# Patient Record
Sex: Male | Born: 1975 | Race: Black or African American | Hispanic: No | Marital: Single | Smoking: Current every day smoker
Health system: Southern US, Community
[De-identification: ages and names within clinical notes are randomized; demographics above are authoritative.]

## PROBLEM LIST (undated history)

## (undated) DIAGNOSIS — I2510 Atherosclerotic heart disease of native coronary artery without angina pectoris: Secondary | ICD-10-CM

## (undated) DIAGNOSIS — I10 Essential (primary) hypertension: Secondary | ICD-10-CM

---

## 2013-10-04 ENCOUNTER — Encounter (HOSPITAL_COMMUNITY): Payer: Self-pay | Admitting: Emergency Medicine

## 2013-10-04 ENCOUNTER — Emergency Department (HOSPITAL_COMMUNITY)
Admission: EM | Admit: 2013-10-04 | Discharge: 2013-10-04 | Disposition: A | Discharge status: Home / Self Care | Payer: Self-pay | Attending: Emergency Medicine | Admitting: Emergency Medicine

## 2013-10-04 ENCOUNTER — Emergency Department (HOSPITAL_COMMUNITY): Payer: Self-pay

## 2013-10-04 DIAGNOSIS — J9801 Acute bronchospasm: Secondary | ICD-10-CM | POA: Insufficient documentation

## 2013-10-04 DIAGNOSIS — R079 Chest pain, unspecified: Secondary | ICD-10-CM | POA: Insufficient documentation

## 2013-10-04 DIAGNOSIS — J40 Bronchitis, not specified as acute or chronic: Secondary | ICD-10-CM | POA: Insufficient documentation

## 2013-10-04 DIAGNOSIS — I2510 Atherosclerotic heart disease of native coronary artery without angina pectoris: Secondary | ICD-10-CM | POA: Insufficient documentation

## 2013-10-04 DIAGNOSIS — M549 Dorsalgia, unspecified: Secondary | ICD-10-CM | POA: Insufficient documentation

## 2013-10-04 DIAGNOSIS — Z79899 Other long term (current) drug therapy: Secondary | ICD-10-CM | POA: Insufficient documentation

## 2013-10-04 DIAGNOSIS — F17200 Nicotine dependence, unspecified, uncomplicated: Secondary | ICD-10-CM | POA: Insufficient documentation

## 2013-10-04 HISTORY — DX: Atherosclerotic heart disease of native coronary artery without angina pectoris: I25.10

## 2013-10-04 LAB — CBC WITH DIFFERENTIAL/PLATELET
BASOS ABS: 0 10*3/uL (ref 0.0–0.1)
BASOS PCT: 0 % (ref 0–1)
EOS ABS: 0.2 10*3/uL (ref 0.0–0.7)
Eosinophils Relative: 2 % (ref 0–5)
HCT: 44.3 % (ref 39.0–52.0)
Hemoglobin: 14.4 g/dL (ref 13.0–17.0)
Lymphocytes Relative: 20 % (ref 12–46)
Lymphs Abs: 1.5 10*3/uL (ref 0.7–4.0)
MCH: 27.5 pg (ref 26.0–34.0)
MCHC: 32.5 g/dL (ref 30.0–36.0)
MCV: 84.5 fL (ref 78.0–100.0)
Monocytes Absolute: 0.6 10*3/uL (ref 0.1–1.0)
Monocytes Relative: 8 % (ref 3–12)
Neutro Abs: 5.3 10*3/uL (ref 1.7–7.7)
Neutrophils Relative %: 70 % (ref 43–77)
Platelets: 125 10*3/uL — ABNORMAL LOW (ref 150–400)
RBC: 5.24 MIL/uL (ref 4.22–5.81)
RDW: 13.8 % (ref 11.5–15.5)
WBC: 7.6 10*3/uL (ref 4.0–10.5)

## 2013-10-04 LAB — I-STAT TROPONIN, ED: Troponin i, poc: 0 ng/mL (ref 0.00–0.08)

## 2013-10-04 MED ORDER — ALBUTEROL SULFATE (2.5 MG/3ML) 0.083% IN NEBU
2.5000 mg | INHALATION_SOLUTION | RESPIRATORY_TRACT | Status: DC | PRN
  Administered 2013-10-04: 2.5 mg via RESPIRATORY_TRACT
  Filled 2013-10-04: qty 3

## 2013-10-04 MED ORDER — HYDROCODONE-ACETAMINOPHEN 5-325 MG PO TABS: 2.0000 | ORAL_TABLET | ORAL | Status: DC | PRN

## 2013-10-04 MED ORDER — ALBUTEROL SULFATE HFA 108 (90 BASE) MCG/ACT IN AERS: 1.0000 | INHALATION_SPRAY | Freq: Four times a day (QID) | RESPIRATORY_TRACT | Status: DC | PRN

## 2013-10-04 NOTE — Discharge Planning (Signed)
[facility] Community Liaison  Spoke to patient regarding primary care resources and establishing care with a provider. [employer] orange card application and instructions provided on how to complete the application. Resource guide and my contact information also provided for future questions or concerns. No other Community Liaison needs identified at this time.

## 2013-10-04 NOTE — ED Notes (Signed)
Cp midsternal last nghtt and congestion w/ sore throat

## 2013-10-04 NOTE — ED Provider Notes (Signed)
CSN: [id number]     Arrival date & time 10/04/13  [id number] History   First MD Initiated Contact with Patient 10/04/13 [phone]     Chief Complaint  Patient presents with  . Chest Pain  . Sore Throat      HPI  Patient presents with sore throat chest pain congestion. He says that today yesterday he developed a cough. Started feeling some tightness in his anterior chest and some sharp pain in his back. This all seemed to be reducible with coughing. He was coughing more and more through the evening. His throat became sore. He takes vitamin C. He slept well. He waking this morning and still has discomfort in his chest with coughing. Coughed up some "phlegm". Is not overtly short of breath. No neck jaw or extremity pain. No recent exertional symptoms. No recent prolonged immobilization cast splints fractures surgeries malignancies or other DVT or PE risks. No family history of DVT, PE, or heart disease. Is not hypertensive or diabetic. He smokes intermittently. Works as a Psychologist, occupationalmusician playing the drums  Past Medical History  Diagnosis Date  . Coronary artery disease    History reviewed. No pertinent past surgical history. History reviewed. No pertinent family history. History  Substance Use Topics  . Smoking status: Current Every Day Smoker  . Smokeless tobacco: Not on file  . Alcohol Use: No    Review of Systems  Constitutional: Negative for fever, chills, diaphoresis, appetite change and fatigue.  HENT: Positive for congestion. Negative for mouth sores, sore throat and trouble swallowing.   Eyes: Negative for visual disturbance.  Respiratory: Positive for chest tightness. Negative for cough, shortness of breath and wheezing.   Cardiovascular: Positive for chest pain.  Gastrointestinal: Negative for nausea, vomiting, abdominal pain, diarrhea and abdominal distention.  Endocrine: Negative for polydipsia, polyphagia and polyuria.  Genitourinary: Negative for dysuria, frequency and hematuria.   Musculoskeletal: Positive for back pain. Negative for gait problem.  Skin: Negative for color change, pallor and rash.  Neurological: Negative for dizziness, syncope, light-headedness and headaches.  Hematological: Does not bruise/bleed easily.  Psychiatric/Behavioral: Negative for behavioral problems and confusion.      Allergies  Review of patient's allergies indicates no known allergies.  Home Medications   Prior to Admission medications   Medication Sig Start Date End Date Taking? Authorizing Provider  albuterol (PROVENTIL HFA;VENTOLIN HFA) 108 (90 BASE) MCG/ACT inhaler Inhale 1-2 puffs into the lungs every 6 (six) hours as needed for wheezing. 10/04/13   Rolland PorterMark Baylen Buckner, MD  HYDROcodone-acetaminophen (NORCO/VICODIN) 5-325 MG per tablet Take 2 tablets by mouth every 4 (four) hours as needed. 10/04/13   Rolland PorterMark Deirdra Heumann, MD   BP 115/64  Pulse 68  Temp(Src) 98.6 F (37 C) (Oral)  Resp 16  SpO2 97% Physical Exam  Constitutional: He is oriented to person, place, and time. He appears well-developed and well-nourished. No distress.  HENT:  Head: Normocephalic.  Eyes: Conjunctivae are normal. Pupils are equal, round, and reactive to light. No scleral icterus.  Neck: Normal range of motion. Neck supple. No thyromegaly present.  Cardiovascular: Normal rate and regular rhythm.  Exam reveals no gallop and no friction rub.   No murmur heard. Pulmonary/Chest: Effort normal. No respiratory distress. He has no wheezes. He has rales.  By squeezing noted bilateral posteriorly with cough.  Abdominal: Soft. Bowel sounds are normal. He exhibits no distension. There is no tenderness. There is no rebound.  Musculoskeletal: Normal range of motion.  Neurological: He is alert and oriented to person,  place, and time.  Skin: Skin is warm and dry. No rash noted.  Psychiatric: He has a normal mood and affect. His behavior is normal.    ED Course  Procedures (including critical care time) Labs Review Labs  Reviewed  CBC WITH DIFFERENTIAL - Abnormal; Notable for the following:    Platelets 125 (*)    All other components within normal limits  I-STAT TROPOININ, ED    Imaging Review Dg Chest 2 View  10/04/2013   CLINICAL DATA:  Chest pain, smoker, congestion  EXAM: CHEST  2 VIEW  COMPARISON:  None.  FINDINGS: Borderline cardiomegaly. No acute infiltrate or pleural effusion. No pulmonary edema. Central mild bronchitic changes.  IMPRESSION: No acute infiltrate or pulmonary edema. Central mild bronchitic changes.   Electronically Signed   By: Natasha Mead M.D.   On: 10/04/2013 08:42     EKG Interpretation   Date/Time:  Tuesday October 04 2013 07:27:48 EDT Ventricular Rate:  66 PR Interval:  146 QRS Duration: 90 QT Interval:  370 QTC Calculation: 387 R Axis:   30 Text Interpretation:  Normal sinus rhythm Normal ECG Confirmed by Fayrene Fearing   MD, Durga Saldarriaga ([id number]) on 10/04/2013 8:12:23 AM      MDM   Final diagnoses:  Bronchitis  Chest pain, unspecified chest pain type  Bronchospasm    Minimal wheezing with cough. Not tachycardic, dyspneic, or tach get neck at rest. Not hypoxemic. PERC negative. Normal EKG. troponin. Heart score of one for smoking. Given albuterol neb. Clear lungs. Plan is discharge home with plan treatment for bronchitis. Albuterol inhaler as needed. Vicodin for cough or pain.    Rolland Porter, MD 10/04/13 1000

## 2013-10-04 NOTE — Discharge Instructions (Signed)
Bronchospasm °A bronchospasm is a spasm or tightening of the airways going into the lungs. During a bronchospasm breathing becomes more difficult because the airways get smaller. When this happens there can be coughing, a whistling sound when breathing (wheezing), and difficulty breathing. Bronchospasm is often associated with asthma, but not all patients who experience a bronchospasm have asthma. °CAUSES  °A bronchospasm is caused by inflammation or irritation of the airways. The inflammation or irritation may be triggered by:  °· Allergies (such as to animals, pollen, food, or mold). Allergens that cause bronchospasm may cause wheezing immediately after exposure or many hours later.   °· Infection. Viral infections are believed to be the most common cause of bronchospasm.   °· Exercise.   °· Irritants (such as pollution, cigarette smoke, strong odors, aerosol sprays, and paint fumes).   °· Weather changes. Winds increase molds and pollens in the air. Rain refreshes the air by washing irritants out. Cold air may cause inflammation.   °· Stress and emotional upset.   °SIGNS AND SYMPTOMS  °· Wheezing.   °· Excessive nighttime coughing.   °· Frequent or severe coughing with a simple cold.   °· Chest tightness.   °· Shortness of breath.   °DIAGNOSIS  °Bronchospasm is usually diagnosed through a history and physical exam. Tests, such as chest X-rays, are sometimes done to look for other conditions. °TREATMENT  °· Inhaled medicines can be given to open up your airways and help you breathe. The medicines can be given using either an inhaler or a nebulizer machine. °· Corticosteroid medicines may be given for severe bronchospasm, usually when it is associated with asthma. °HOME CARE INSTRUCTIONS  °· Always have a plan prepared for seeking medical care. Know when to call your health care provider and local emergency services (911 in the [place] Know where you can access local emergency care. °· Only take medicines as  directed by your health care provider. °· If you were prescribed an inhaler or nebulizer machine, ask your health care provider to explain how to use it correctly. Always use a spacer with your inhaler if you were given one. °· It is necessary to remain calm during an attack. Try to relax and breathe more slowly.  °· Control your home environment in the following ways:   °¨ Change your heating and air conditioning filter at least once a month.   °¨ Limit your use of fireplaces and wood stoves. °¨ Do not smoke and do not allow smoking in your home.   °¨ Avoid exposure to perfumes and fragrances.   °¨ Get rid of pests (such as roaches and mice) and their droppings.   °¨ Throw away plants if you see mold on them.   °¨ Keep your house clean and dust free.   °¨ Replace carpet with wood, tile, or vinyl flooring. Carpet can trap dander and dust.   °¨ Use allergy-proof pillows, mattress covers, and box spring covers.   °¨ Wash bed sheets and blankets every week in hot water and dry them in a dryer.   °¨ Use blankets that are made of polyester or cotton.   °¨ Wash hands frequently. °SEEK MEDICAL CARE IF:  °· You have muscle aches.   °· You have chest pain.   °· The sputum changes from clear or white to yellow, green, gray, or bloody.   °· The sputum you cough up gets thicker.   °· There are problems that may be related to the medicine you are given, such as a rash, itching, swelling, or trouble breathing.   °SEEK IMMEDIATE MEDICAL CARE IF:  °· You have worsening wheezing and coughing even   after taking your prescribed medicines.   You have increased difficulty breathing.   You develop severe chest pain. MAKE SURE YOU:   Understand these instructions.  Will watch your condition.  Will get help right away if you are not doing well or get worse. Document Released: 02/06/2003 Document Revised: 02/08/2013 Document Reviewed: 07/26/2012 [facility]Exit[facility] Patient Information 2015 [place], [place]. This information is not  intended to replace advice given to you by your health care provider. Make sure you discuss any questions you have with your health care provider.  Viral Infections A virus is a type of germ. Viruses can cause:  Minor sore throats.  Aches and pains.  Headaches.  Runny nose.  Rashes.  Watery eyes.  Tiredness.  Coughs.  Loss of appetite.  Feeling sick to your stomach (nausea).  Throwing up (vomiting).  Watery poop (diarrhea). HOME CARE   Only take medicines as told by your doctor.  Drink enough water and fluids to keep your pee (urine) clear or pale yellow. Sports drinks are a good choice.  Get plenty of rest and eat healthy. Soups and broths with crackers or rice are fine. GET HELP RIGHT AWAY IF:   You have a very bad headache.  You have shortness of breath.  You have chest pain or neck pain.  You have an unusual rash.  You cannot stop throwing up.  You have watery poop that does not stop.  You cannot keep fluids down.  You or your child has a temperature by mouth above 102 F (38.9 C), not controlled by medicine.  Your baby is older than 3 months with a rectal temperature of 102 F (38.9 C) or higher.  Your baby is 623 months old or younger with a rectal temperature of 100.4 F (38 C) or higher. MAKE SURE YOU:   Understand these instructions.  Will watch this condition.  Will get help right away if you are not doing well or get worse. Document Released: 01/17/2008 Document Revised: 04/28/2011 Document Reviewed: 06/11/2010 [facility] Patient Information 2015 [place], [place]. This information is not intended to replace advice given to you by your health care provider. Make sure you discuss any questions you have with your health care provider.

## 2013-12-16 ENCOUNTER — Encounter (HOSPITAL_COMMUNITY): Payer: Self-pay | Admitting: Emergency Medicine

## 2013-12-16 ENCOUNTER — Emergency Department (HOSPITAL_COMMUNITY): Payer: Self-pay

## 2013-12-16 ENCOUNTER — Emergency Department (HOSPITAL_COMMUNITY)
Admission: EM | Admit: 2013-12-16 | Discharge: 2013-12-16 | Disposition: A | Discharge status: Home / Self Care | Payer: Self-pay | Attending: Emergency Medicine | Admitting: Emergency Medicine

## 2013-12-16 DIAGNOSIS — I2510 Atherosclerotic heart disease of native coronary artery without angina pectoris: Secondary | ICD-10-CM | POA: Insufficient documentation

## 2013-12-16 DIAGNOSIS — Y9389 Activity, other specified: Secondary | ICD-10-CM | POA: Insufficient documentation

## 2013-12-16 DIAGNOSIS — Z791 Long term (current) use of non-steroidal anti-inflammatories (NSAID): Secondary | ICD-10-CM | POA: Insufficient documentation

## 2013-12-16 DIAGNOSIS — Y929 Unspecified place or not applicable: Secondary | ICD-10-CM | POA: Insufficient documentation

## 2013-12-16 DIAGNOSIS — Z87891 Personal history of nicotine dependence: Secondary | ICD-10-CM | POA: Insufficient documentation

## 2013-12-16 DIAGNOSIS — I10 Essential (primary) hypertension: Secondary | ICD-10-CM | POA: Insufficient documentation

## 2013-12-16 DIAGNOSIS — S39012A Strain of muscle, fascia and tendon of lower back, initial encounter: Secondary | ICD-10-CM | POA: Insufficient documentation

## 2013-12-16 DIAGNOSIS — X58XXXA Exposure to other specified factors, initial encounter: Secondary | ICD-10-CM | POA: Insufficient documentation

## 2013-12-16 DIAGNOSIS — M549 Dorsalgia, unspecified: Secondary | ICD-10-CM

## 2013-12-16 DIAGNOSIS — F1490 Cocaine use, unspecified, uncomplicated: Secondary | ICD-10-CM | POA: Insufficient documentation

## 2013-12-16 DIAGNOSIS — I252 Old myocardial infarction: Secondary | ICD-10-CM | POA: Insufficient documentation

## 2013-12-16 HISTORY — DX: Essential (primary) hypertension: I10

## 2013-12-16 LAB — URINALYSIS, ROUTINE W REFLEX MICROSCOPIC
BILIRUBIN URINE: NEGATIVE
Glucose, UA: NEGATIVE mg/dL
Hgb urine dipstick: NEGATIVE
KETONES UR: NEGATIVE mg/dL
LEUKOCYTES UA: NEGATIVE
Nitrite: NEGATIVE
PH: 6 (ref 5.0–8.0)
PROTEIN: NEGATIVE mg/dL
Specific Gravity, Urine: 1.02 (ref 1.005–1.030)
Urobilinogen, UA: 1 mg/dL (ref 0.0–1.0)

## 2013-12-16 MED ORDER — OXYCODONE-ACETAMINOPHEN 5-325 MG PO TABS: 2.0000 | ORAL_TABLET | Freq: Once | ORAL | Status: DC

## 2013-12-16 MED ORDER — DIAZEPAM 5 MG PO TABS
5.0000 mg | ORAL_TABLET | Freq: Once | ORAL | Status: AC
  Administered 2013-12-16: 5 mg via ORAL
  Filled 2013-12-16: qty 1

## 2013-12-16 MED ORDER — DIAZEPAM 5 MG PO TABS: 5.0000 mg | ORAL_TABLET | Freq: Three times a day (TID) | ORAL | Status: DC | PRN

## 2013-12-16 NOTE — Discharge Instructions (Signed)
Back Pain, Adult °Low back pain is very common. About 1 in 5 people have back pain. The cause of low back pain is rarely dangerous. The pain often gets better over time. About half of people with a sudden onset of back pain feel better in just 2 weeks. About 8 in 10 people feel better by 6 weeks.  °CAUSES °Some common causes of back pain include: °· Strain of the muscles or ligaments supporting the spine. °· Wear and tear (degeneration) of the spinal discs. °· Arthritis. °· Direct injury to the back. °DIAGNOSIS °Most of the time, the direct cause of low back pain is not known. However, back pain can be treated effectively even when the exact cause of the pain is unknown. Answering your caregiver's questions about your overall health and symptoms is one of the most accurate ways to make sure the cause of your pain is not dangerous. If your caregiver needs more information, he or she may order lab work or imaging tests (X-rays or MRIs). However, even if imaging tests show changes in your back, this usually does not require surgery. °HOME CARE INSTRUCTIONS °For many people, back pain returns. Since low back pain is rarely dangerous, it is often a condition that people can learn to manage on their own.  °· Remain active. It is stressful on the back to sit or stand in one place. Do not sit, drive, or stand in one place for more than 30 minutes at a time. Take short walks on level surfaces as soon as pain allows. Try to increase the length of time you walk each day. °· Do not stay in bed. Resting more than 1 or 2 days can delay your recovery. °· Do not avoid exercise or work. Your body is made to move. It is not dangerous to be active, even though your back may hurt. Your back will likely heal faster if you return to being active before your pain is gone. °· Pay attention to your body when you  bend and lift. Many people have less discomfort when lifting if they bend their knees, keep the load close to their bodies, and  avoid twisting. Often, the most comfortable positions are those that put less stress on your recovering back. °· Find a comfortable position to sleep. Use a firm mattress and lie on your side with your knees slightly bent. If you lie on your back, put a pillow under your knees. °· Only take over-the-counter or prescription medicines as directed by your caregiver. Over-the-counter medicines to reduce pain and inflammation are often the most helpful. Your caregiver may prescribe muscle relaxant drugs. These medicines help dull your pain so you can more quickly return to your normal activities and healthy exercise. °· Put ice on the injured area. °¨ Put ice in a plastic bag. °¨ Place a towel between your skin and the bag. °¨ Leave the ice on for 15-20 minutes, 03-04 times a day for the first 2 to 3 days. After that, ice and heat may be alternated to reduce pain and spasms. °· Ask your caregiver about trying back exercises and gentle massage. This may be of some benefit. °· Avoid feeling anxious or stressed. Stress increases muscle tension and can worsen back pain. It is important to recognize when you are anxious or stressed and learn ways to manage it. Exercise is a great option. °SEEK MEDICAL CARE IF: °· You have pain that is not relieved with rest or medicine. °· You have pain that does not improve in 1 week. °· You have new symptoms. °· You are generally not feeling well. °SEEK   IMMEDIATE MEDICAL CARE IF:  °· You have pain that radiates from your back into your legs. °· You develop new bowel or bladder control problems. °· You have unusual weakness or numbness in your arms or legs. °· You develop nausea or vomiting. °· You develop abdominal pain. °· You feel faint. °Document Released: 02/03/2005 Document Revised: 08/05/2011 Document Reviewed: 06/07/2013 °ExitCare® Patient Information ©2015 [employer]. This information is not intended to replace advice given to you by your health care provider. Make sure you  discuss any questions you have with your health care provider. ° ° ° ° °Emergency Department Resource Guide °1) Find a Doctor and Pay Out of Pocket °Although you won't have to find out who is covered by your insurance plan, it is a good idea to ask around and get recommendations. You will then need to call the office and see if the doctor you have chosen will accept you as a new patient and what types of options they offer for patients who are self-pay. Some doctors offer discounts or will set up payment plans for their patients who do not have insurance, but you will need to ask so you aren't surprised when you get to your appointment. ° °2) Contact Your Local Health Department °Not all health departments have doctors that can see patients for sick visits, but many do, so it is worth a call to see if yours does. If you don't know where your local health department is, you can check in your phone book. The CDC also has a tool to help you locate your state's health department, and many state websites also have listings of all of their local health departments. ° °3) Find a Walk-in Clinic °If your illness is not likely to be very severe or complicated, you may want to try a walk in clinic. These are popping up all over the country in pharmacies, drugstores, and shopping centers. They're usually staffed by nurse practitioners or physician assistants that have been trained to treat common illnesses and complaints. They're usually fairly quick and inexpensive. However, if you have serious medical issues or chronic medical problems, these are probably not your best option. ° °No Primary Care Doctor: °- Call [facility] at  832-8000 - they can help you locate a primary care doctor that  accepts your insurance, provides certain services, etc. °- Physician Referral Service- 1-800-533-3463 ° °Chronic Pain Problems: °Organization         Address  Phone   Notes  °[facility]  (336) 297-2271 Patients need  to be referred by their primary care doctor.  ° °Medication Assistance: °Organization         Address  Phone   Notes  °[place] [facility] [address] °[place], [place] [address] (336) 641-8030 --Must be a resident of [place] °-- Must have NO insurance coverage whatsoever (no Medicaid/ Medicare, etc.) °-- The pt. MUST have a primary care doctor that directs their care regularly and follows them in the community °  °[facility]  (866) 331-1348   °[employer]  (888) 892-1162   ° °Agencies that provide inexpensive medical care: °Organization         Address  Phone   Notes  °[facility]  (336) 832-8035   °[facility]    (336) 832-7272   °[facility] [address] °[place],  [address] (336) 832-4777   °[facility]  [address], °[place] (336) 271-4999   °[facility]    (336) 373-0678   °[facility]    (336) 272-1050   °[facility] ° [address], [place] Phone:  (336) 832-4444, Fax:  (336) 832-4440 Hours of Operation:  9 am - 6 pm, M-F.  Also accepts Medicaid/Medicare and self-pay.  °[facility] ° [address], [place] Phone: (336) 832-3150, Fax: (336) 832-3151. Hours of Operation:  8:30 am - 5:30 pm, M-F.  Also accepts Medicaid and self-pay.  °[facility] [place] [address], [place] Phone: (336) 878-6027   °[facility] [address], [place], [place] (336)723-1848, Ext. 123 Mondays & Thursdays: 7-9 AM.  First 15 patients are seen on a first come, first serve basis. °  ° °Medicaid-accepting [place] Providers: ° °Organization         Address  Phone   Notes  °[facility] [address], [place] (336) 641-2100 Also accepts self-pay patients.  °[facility] [address], [place] ° (336) 856-9996   °[facility] [address], [place] (336) 288-8857   °[facility] [address] [place] [address], [place] (336) 299-7000   °Veita Bland [address], [place]  ° (336) 373-1557 Only accepts [place] Access Medicaid patients after they have their name applied to their card.  ° °Self-Pay (no insurance) in [place]: ° °Organization         Address  Phone   Notes  °Sickle Cell Patients, [facility] [address], [place] (336) 832-1970   °[facility] [address], [place] (336) 832-4400   °[facility] ° [address], [place] (336) 992-4800   °[facility]/Dr. Osei-Bonsu ° [address] [place] [address], [place] or [address], [place] (336) 841-8500 Phone number for both [place] and [place] locations is the same.  °[facility] [address], [place] (336) 299-0000   °[facility] [address] [place] [address], [place] or [address] (336) 852-7530 °(336) 878-2260   °[facility] [address],  (336) 350-1642, phone; (336) 294-5005, fax Sees patients 1st and 3rd Saturday of every month.  Must not qualify for public or private insurance (i.e. Medicaid, Medicare, [place] Health Choice, Veterans' Benefits) • Household income should be no more than 200% of the poverty level •The clinic cannot treat you if you are pregnant or think you are pregnant • Sexually transmitted diseases are not treated at the clinic.  ° ° °Dental Care: °Organization         Address  Phone  Notes  °[facility] [facility] [address],  (336) 641-6152 Accepts children up to age 21 who are enrolled in Medicaid or [place] Health Choice; pregnant women with a Medicaid card; and children who have applied for Medicaid or [place] Health Choice, but were declined, whose  parents can pay a reduced fee at time of service.  °[facility] [place]  [address], [place] (336) 641-7733 Accepts children up to age 21 who are enrolled in Medicaid or [place] Health Choice; pregnant women with a Medicaid card; and children who have applied for Medicaid   or [place] Health Choice, but were declined, whose parents can pay a reduced fee at time of service.  °[facility] ° [address], [place] (336) 641-4533 Patients are seen by appointment only. Walk-ins are not accepted. [facility] will see patients 18 years of age and older. °Monday - Tuesday (8am-5pm) °Most Wednesdays (8:30-5pm) °$30 per visit, cash only  °[facility] ° [address], [place] (336) 641-4533 Patients are seen by appointment only. Walk-ins are not accepted. [facility] will see patients 18 years of age and older. °One Wednesday Evening (Monthly: Volunteer Based).  $30 per visit, cash only  °[facility]  (919) 537-3737 for adults; Children under age 4, call Graduate Pediatric Dentistry at (919) [phone]. Children aged 4-14, please call (919) [phone] to request a pediatric application. ° Dental services are provided in all areas of dental care including fillings, crowns and bridges, complete and partial dentures, implants, gum treatment, root canals, and extractions. Preventive care is also provided. Treatment is provided to both adults and children. °Patients are selected via a lottery and there is often a waiting list. °  °[facility] [address], °[place] ° (336) 763-8833 [email or web address] °  °[facility] [address], [place], [place] (336)723-1848, Ext. 123 Second and Fourth Thursday of each month, opens at 6:30 AM; Clinic ends at 9 AM.  Patients are seen on a first-come first-served basis, and a limited number are seen during each clinic.  ° °[facility] °  [address], [place], [place] (336) 723-7904   Eligibility Requirements °You must have lived in [place], [place], or [place] counties for at least the last three months. °  You cannot be eligible for state or federal sponsored healthcare insurance, including Veterans Administration, Medicaid, or Medicare. °  You generally cannot be eligible for healthcare insurance through your employer.  °  How to apply: °Eligibility screenings are held every Tuesday and Wednesday afternoon from 1:00 pm until 4:00 pm. You do not need an appointment for the interview!  °[facility] [address], [place], [place] 336-631-2330   °[facility]  336-342-8273   °[facility]  336-703-3100   °[facility]  336-570-6415   ° °Behavioral Health Resources in the Community: °Intensive Outpatient Programs °Organization         Address  Phone  Notes  °[facility] [address], [place], [place] 336-878-6098   °[facility] [address], [place], [place] 336-832-9800   °[facility] [address], [place], [place] ° 336-882-2125   °[facility] [address],  °[place],  1-800-853-5163 or 336-641-4981   °Substance Abuse Resources °Organization         Address  Phone  Notes  °[facility]  336-882-2125   °[facility]  336-784-9470   °[facility]  336-285-9073   °[facility]  336-845-3988   °Residential & Outpatient Substance Abuse Program  1-800-659-3381   °Psychological Services °Organization         Address  Phone  Notes  °[facility]  336- 832-9600   °[facility]  336- 378-7881   °[facility] [address], [place] 1-800-853-5163 or 336-641-4981   ° °Mobile Crisis Teams °Organization         Address  Phone    Notes  °[facility], Mobile Crisis Care Unit  1-877-626-1772     °Assertive °[facility] ° [address] [place], [place] 336-834-9664   °Sharon DeEsch [address] °[place] 336-554-5454   ° °Self-Help/Support Groups °Organization         Address  Phone             Notes  °[facility] [place] - variety of support groups  336- 373-1402 Call for more information  °Narcotics Anonymous (NA), [facility] [address], °[place]  2 meetings at this location  ° °Residential Treatment Programs °Organization         Address  Phone  Notes  °[facility] [address],    °[place]  1-866-801-8205   °[facility] ° [address], [place], [place] 704-293-8524   °[facility] [address], [place] 336-845-3988 Admissions: 8am-3pm M-F  °[facility] [address]    °[place], [place] 336-841-1104   °[facility] [address], [place], [place] 336-379-7146   °[facility] [address]  °[place], [place] 336-285-9073   °[facility] - Intensive Outpatient [address], [place], [place] 336-852-3033   °[facility] [address]  °[place], [place] 1-877-615-2722 or 336-784-9470   °[facility] [address] [place], [place] 336-227-7417 Accepts Medicaid  °[facility] [address]  °[place] 1-800-659-3381 Substance Abuse/Addiction Treatment  ° °[place] Behavioral Health Resources °Organization         Address  Phone  Notes  °[facility]  (888) 581-9988   °Julie Brannon, PhD [address] [place], [place]   (336) 349-5553 or (336) 951-0000   °[facility]   [address] °[place], [place] (336) 349-4454   °[facility] [address], [place], [place] (336) 342-8316 Insurance/Medicaid/sponsorship through [facility]  °[facility] [address]                                     [place], [place] (336) 342-8316 Therapy/tele-psych/case  °[facility] [address]  ° [place], [place] (336) 349-2233    °Dr. Arfeen  (336) 349-4544   °[facility] [place]  [employer] [place] [facility] 1) [address], [place] °2) [address], [place] °3)  [address], [place] (336) 349-3220 °(336) 342-7768 ° °(336) 342-8140   °[place] [facility] (336) 342-1394 or (336) 342-3537 (After Hours)    ° ° ° ° °

## 2013-12-16 NOTE — ED Provider Notes (Signed)
CSN: [id number]     Arrival date & time 12/16/13  0722 History   First MD Initiated Contact with Patient 12/16/13 [phone]     Chief Complaint  Patient presents with  . Back Pain     (Consider location/radiation/quality/duration/timing/severity/associated sxs/prior Treatment) Patient is a 38 y.o. male presenting with back pain.  Back Pain Location:  Lumbar spine Quality:  Aching Radiates to:  Does not radiate Pain severity:  Severe Pain is:  Unable to specify Onset quality:  Gradual Duration:  4 days Timing:  Constant Progression:  Unchanged Chronicity:  New Context: emotional stress   Relieved by:  Nothing Worsened by:  Twisting Ineffective treatments:  None tried Associated symptoms: no bladder incontinence, no bowel incontinence, no chest pain, no fever, no perianal numbness and no tingling     Past Medical History  Diagnosis Date  . Coronary artery disease   . Hypertension    History reviewed. No pertinent past surgical history. History reviewed. No pertinent family history. History  Substance Use Topics  . Smoking status: Former Games developermoker  . Smokeless tobacco: Never Used  . Alcohol Use: No    Review of Systems  Constitutional: Negative for fever.  Cardiovascular: Negative for chest pain.  Gastrointestinal: Negative for bowel incontinence.  Genitourinary: Negative for bladder incontinence.  Musculoskeletal: Positive for back pain.  Neurological: Negative for tingling.  All other systems reviewed and are negative.     Allergies  Review of patient's allergies indicates no known allergies.  Home Medications   Prior to Admission medications   Medication Sig Start Date End Date Taking? Authorizing Murriel Holwerda  ibuprofen (ADVIL,MOTRIN) 200 MG tablet Take 1,400-1,600 mg by mouth 2 (two) times daily as needed for moderate pain.   Yes Historical Jaydrian Corpening, MD  naproxen sodium (ANAPROX) 220 MG tablet Take 880-1,320 mg by mouth 2 (two) times daily as needed (pain).   Yes  Historical Sivan Cuello, MD  diazepam (VALIUM) 5 MG tablet Take 1 tablet (5 mg total) by mouth every 8 (eight) hours as needed for muscle spasms. 12/16/13   Mirian MoMatthew Gentry, MD   BP 124/80  Pulse 58  Temp(Src) 98.3 F (36.8 C) (Oral)  SpO2 100% Physical Exam  Vitals reviewed. Constitutional: He is oriented to person, place, and time. He appears well-developed and well-nourished.  HENT:  Head: Normocephalic and atraumatic.  Eyes: Conjunctivae and EOM are normal.  Neck: Normal range of motion. Neck supple.  Cardiovascular: Normal rate, regular rhythm and normal heart sounds.   Pulmonary/Chest: Effort normal and breath sounds normal. No respiratory distress.  Abdominal: He exhibits no distension. There is no tenderness. There is no rebound and no guarding.  Musculoskeletal: Normal range of motion.       Cervical back: Normal.       Thoracic back: Normal.       Lumbar back: He exhibits tenderness and bony tenderness.  Neurological: He is alert and oriented to person, place, and time.  Skin: Skin is warm and dry.    ED Course  Procedures (including critical care time) Labs Review Labs Reviewed  URINALYSIS, ROUTINE W REFLEX MICROSCOPIC    Imaging Review Dg Lumbar Spine Complete  12/16/2013   CLINICAL DATA:  38 year old male with acute low back pain radiating down both legs. No known injury. Initial encounter.  EXAM: LUMBAR SPINE - COMPLETE 4+ VIEW  COMPARISON:  None.  FINDINGS: Normal lumbar segmentation. Bone mineralization is within normal limits. Vertebral height and alignment within normal limits. Relatively preserved disc spaces. Mild endplate spurring  most pronounced at L4-L5. No pars fracture. No facet sclerosis. sacral ala and SI joints within normal limits. Grossly intact visible lower thoracic levels. There is lower thoracic endplate spurring.  IMPRESSION: Largely unremarkable for age radiographic appearance of the lumbar spine.   Electronically Signed   By: Augusto GambleLee  Hall M.D.   On:  12/16/2013 08:27     EKG Interpretation None      MDM   Final diagnoses:  Back pain  Low back strain, initial encounter    38 y.o. male with pertinent PMH of HTN presents with lumbar back pain and concern for cocaine use.  Patient endorses increased life stressors, use of cocaine, and spontaneous atraumatic back pain beginning 3-4 days ago. He states the pain is worse with movement, denies systemic symptoms such as nausea, vomiting, fever, chest pain, shortness of breath. On medical history the patient states that he has had a prior heart attack, however states that he was evaluated for 8 hours then discharged. He did not have heart catheterization or any other concerning features for true coronary artery disease.  He does have a family ho MI at early age.  On arrival today, vital signs physical exam as above consistent with muscular skeletal back pain. Patient is exact reproduction of symptoms with twisting the back and lifting of bilateral lower extremity. No historical or physical exam elements concerning for cauda equina or central cord pathology. Similarly no fevers to suggest epidural abscess. Suspect muscular skeletal strain secondary to increased stressors.  UA and xr unremarkable.  DC home in stable condition.    1. Back pain   2. Low back strain, initial encounter         Mirian MoMatthew Gentry, MD 12/16/13 [phone]

## 2013-12-16 NOTE — ED Notes (Signed)
Pt from home with c/o bilateral lower back pain x 3 days.  Pt reports increased pain that is sharp when lifting legs.  Pt ambulatory to room with ease, NAD, A&O.

## 2014-01-24 ENCOUNTER — Encounter (HOSPITAL_COMMUNITY): Payer: Self-pay | Admitting: *Deleted

## 2014-01-24 ENCOUNTER — Emergency Department (HOSPITAL_COMMUNITY)
Admission: EM | Admit: 2014-01-24 | Discharge: 2014-01-24 | Disposition: A | Discharge status: Other Acute Inpatient Hospital | Payer: Self-pay | Attending: Emergency Medicine | Admitting: Emergency Medicine

## 2014-01-24 ENCOUNTER — Encounter (HOSPITAL_COMMUNITY): Payer: Self-pay | Admitting: Emergency Medicine

## 2014-01-24 ENCOUNTER — Inpatient Hospital Stay (HOSPITAL_COMMUNITY)
Admission: AD | Admit: 2014-01-24 | Discharge: 2014-01-27 | DRG: 881 | Disposition: A | Discharge status: Home / Self Care | Payer: Federal, State, Local not specified - Other | Source: Intra-hospital | Attending: Psychiatry | Admitting: Psychiatry

## 2014-01-24 DIAGNOSIS — F329 Major depressive disorder, single episode, unspecified: Principal | ICD-10-CM | POA: Diagnosis present

## 2014-01-24 DIAGNOSIS — F32A Depression, unspecified: Secondary | ICD-10-CM

## 2014-01-24 DIAGNOSIS — Z87891 Personal history of nicotine dependence: Secondary | ICD-10-CM

## 2014-01-24 DIAGNOSIS — I10 Essential (primary) hypertension: Secondary | ICD-10-CM | POA: Insufficient documentation

## 2014-01-24 DIAGNOSIS — R45851 Suicidal ideations: Secondary | ICD-10-CM | POA: Diagnosis present

## 2014-01-24 DIAGNOSIS — F321 Major depressive disorder, single episode, moderate: Secondary | ICD-10-CM | POA: Insufficient documentation

## 2014-01-24 DIAGNOSIS — I2510 Atherosclerotic heart disease of native coronary artery without angina pectoris: Secondary | ICD-10-CM | POA: Insufficient documentation

## 2014-01-24 LAB — BASIC METABOLIC PANEL
ANION GAP: 13 (ref 5–15)
BUN: 12 mg/dL (ref 6–23)
CALCIUM: 9.7 mg/dL (ref 8.4–10.5)
CHLORIDE: 100 meq/L (ref 96–112)
CO2: 25 mEq/L (ref 19–32)
CREATININE: 1.41 mg/dL — AB (ref 0.50–1.35)
GFR calc Af Amer: 73 mL/min — ABNORMAL LOW (ref >90)
GFR calc non Af Amer: 63 mL/min — ABNORMAL LOW (ref >90)
Glucose, Bld: 104 mg/dL — ABNORMAL HIGH (ref 70–99)
Potassium: 3.9 mEq/L (ref 3.7–5.3)
Sodium: 138 mEq/L (ref 137–147)

## 2014-01-24 LAB — CBC WITH DIFFERENTIAL/PLATELET
BASOS PCT: 0 % (ref 0–1)
Basophils Absolute: 0 10*3/uL (ref 0.0–0.1)
EOS ABS: 0.1 10*3/uL (ref 0.0–0.7)
Eosinophils Relative: 1 % (ref 0–5)
HEMATOCRIT: 47.2 % (ref 39.0–52.0)
HEMOGLOBIN: 15.4 g/dL (ref 13.0–17.0)
Lymphocytes Relative: 25 % (ref 12–46)
Lymphs Abs: 2.1 10*3/uL (ref 0.7–4.0)
MCH: 27.8 pg (ref 26.0–34.0)
MCHC: 32.6 g/dL (ref 30.0–36.0)
MCV: 85.2 fL (ref 78.0–100.0)
MONO ABS: 0.6 10*3/uL (ref 0.1–1.0)
Monocytes Relative: 7 % (ref 3–12)
Neutro Abs: 5.7 10*3/uL (ref 1.7–7.7)
Neutrophils Relative %: 67 % (ref 43–77)
Platelets: 187 10*3/uL (ref 150–400)
RBC: 5.54 MIL/uL (ref 4.22–5.81)
RDW: 14.3 % (ref 11.5–15.5)
WBC: 8.5 10*3/uL (ref 4.0–10.5)

## 2014-01-24 LAB — ETHANOL: Alcohol, Ethyl (B): <11 mg/dL (ref 0–11)

## 2014-01-24 MED ORDER — ONDANSETRON HCL 4 MG PO TABS: 4.0000 mg | ORAL_TABLET | Freq: Three times a day (TID) | ORAL | Status: DC | PRN

## 2014-01-24 MED ORDER — NICOTINE 21 MG/24HR TD PT24
21.0000 mg | MEDICATED_PATCH | Freq: Every day | TRANSDERMAL | Status: DC
  Administered 2014-01-25 – 2014-01-27 (×3): 21 mg via TRANSDERMAL
  Filled 2014-01-24 (×6): qty 1

## 2014-01-24 MED ORDER — ALUM & MAG HYDROXIDE-SIMETH 200-200-20 MG/5ML PO SUSP: 30.0000 mL | ORAL | Status: DC | PRN

## 2014-01-24 MED ORDER — MAGNESIUM HYDROXIDE 400 MG/5ML PO SUSP: 30.0000 mL | Freq: Every day | ORAL | Status: DC | PRN

## 2014-01-24 MED ORDER — LORAZEPAM 1 MG PO TABS: 1.0000 mg | ORAL_TABLET | Freq: Three times a day (TID) | ORAL | Status: DC | PRN

## 2014-01-24 MED ORDER — ACETAMINOPHEN 325 MG PO TABS: 650.0000 mg | ORAL_TABLET | Freq: Four times a day (QID) | ORAL | Status: DC | PRN

## 2014-01-24 MED ORDER — ACETAMINOPHEN 325 MG PO TABS: 650.0000 mg | ORAL_TABLET | ORAL | Status: DC | PRN

## 2014-01-24 MED ORDER — NICOTINE 21 MG/24HR TD PT24
21.0000 mg | MEDICATED_PATCH | Freq: Every day | TRANSDERMAL | Status: DC
  Administered 2014-01-24: 21 mg via TRANSDERMAL
  Filled 2014-01-24: qty 1

## 2014-01-24 MED ORDER — POLYETHYLENE GLYCOL 3350 17 G PO PACK
17.0000 g | PACK | Freq: Every day | ORAL | Status: DC
  Administered 2014-01-24: 17 g via ORAL
  Filled 2014-01-24 (×5): qty 1
  Filled 2014-01-24: qty 14
  Filled 2014-01-24 (×3): qty 1

## 2014-01-24 MED ORDER — TRAZODONE HCL 50 MG PO TABS
50.0000 mg | ORAL_TABLET | Freq: Every day | ORAL | Status: DC
  Administered 2014-01-25 – 2014-01-26 (×2): 50 mg via ORAL
  Filled 2014-01-24 (×3): qty 1
  Filled 2014-01-24: qty 14
  Filled 2014-01-24 (×2): qty 1

## 2014-01-24 NOTE — ED Provider Notes (Signed)
CSN: [id number]     Arrival date & time 01/24/14  [id number] History   First MD Initiated Contact with Patient 01/24/14 [phone]     Chief Complaint  Patient presents with  . Depression     (Consider location/radiation/quality/duration/timing/severity/associated sxs/prior Treatment) HPI  Pt presenting with c/o depression and threats of suicide.  He sent a text message to a friend/family member saying that he was so upset that he could only "end his life"  This person contacted police after patient did not return phone calls.  Pt came to the ED voluntarily with police to seek help.  He states he has not been able to sleep, he states that his plan would be to jump in front of traffic.  He states he is feeling improved today but would like to have some counseling.  There are no other associated systemic symptoms, there are no other alleviating or modifying factors.  No recent illnesses, no fever.    Past Medical History  Diagnosis Date  . Coronary artery disease   . Hypertension    History reviewed. No pertinent past surgical history. History reviewed. No pertinent family history. History  Substance Use Topics  . Smoking status: Former Games developermoker  . Smokeless tobacco: Never Used  . Alcohol Use: No    Review of Systems  ROS reviewed and all otherwise negative except for mentioned in HPI    Allergies  Review of patient's allergies indicates no known allergies.  Home Medications   Prior to Admission medications   Medication Sig Start Date End Date Taking? Authorizing Provider  diazepam (VALIUM) 5 MG tablet Take 1 tablet (5 mg total) by mouth every 8 (eight) hours as needed for muscle spasms. 12/16/13  Yes Mirian MoMatthew Gentry, MD  ibuprofen (ADVIL,MOTRIN) 200 MG tablet Take 1,400-1,600 mg by mouth 2 (two) times daily as needed for moderate pain.   Yes Historical Provider, MD  naproxen sodium (ANAPROX) 220 MG tablet Take 880-1,320 mg by mouth 2 (two) times daily as needed (pain).   Yes Historical  Provider, MD   BP 97/45 mmHg  Pulse 58  Temp(Src) 98.1 F (36.7 C) (Oral)  Resp 16  SpO2 100%  Vitals reviewed Physical Exam  Physical Examination: General appearance - alert, well appearing, and in no distress Mental status - alert, oriented to person, place, and time Eyes - no scleral icterus, no conjunctival injection Mouth - mucous membranes moist, pharynx normal without lesions Chest - clear to auscultation, no wheezes, rales or rhonchi, symmetric air entry Heart - normal rate, regular rhythm, normal S1, S2, no murmurs, rubs, clicks or gallops Extremities - peripheral pulses normal, no pedal edema, no clubbing or cyanosis Skin - normal coloration and turgor, no rashes Psych- normal mood and affect  ED Course  Procedures (including critical care time) Labs Review Labs Reviewed  BASIC METABOLIC PANEL - Abnormal; Notable for the following:    Glucose, Bld 104 (*)    Creatinine, Ser 1.41 (*)    GFR calc non Af Amer 63 (*)    GFR calc Af Amer 73 (*)    All other components within normal limits  CBC WITH DIFFERENTIAL  ETHANOL  URINE RAPID DRUG SCREEN (HOSP PERFORMED)    Imaging Review No results found.   EKG Interpretation None      MDM   Final diagnoses:  Depression with suicidal ideation    Pt presenting with c/o depression and made suicidal statements yesterday via text message. Pt medically cleared and has been seen by  TTS, pt has been accepted [facility] for [facility].      Ethelda ChickMartha K Linker, MD 01/24/14 [phone]

## 2014-01-24 NOTE — ED Notes (Signed)
MD at bedside. 

## 2014-01-24 NOTE — BH Assessment (Signed)
[facility] by Dr. Jannifer FranklinAkintayo and Assunta FoundShuvon Rankin, NP. Nursing report [phone]. Support paperwork completed.

## 2014-01-24 NOTE — ED Notes (Signed)
Pt came to ED for psychiatric counseling due to depression and intermittent feelings of self harm. Pt states he wants this pain to end, and that his plan for suicide would be to jump in front of a moving car. Pt denies any desire to hurt himself today. Patient was recently separated from his family, has been unable to sleep, denies hallucinations.

## 2014-01-24 NOTE — Tx Team (Signed)
Initial Interdisciplinary Treatment Plan   PATIENT STRESSORS: Marital or family conflict   PATIENT STRENGTHS: Capable of independent living General fund of knowledge Physical Health   PROBLEM LIST: Problem List/Patient Goals Date to be addressed Date deferred Reason deferred Estimated date of resolution  none                                                       DISCHARGE CRITERIA:  Improved stabilization in mood, thinking, and/or behavior Need for constant or close observation no longer present  PRELIMINARY DISCHARGE PLAN: Return to previous living arrangement Return to previous work or school arrangements  PATIENT/FAMIILY INVOLVEMENT: This treatment plan has been presented to and reviewed with the patient, Jose Hays.  The patient and family have been given the opportunity to ask questions and make suggestions.  [name], Jose Hays 01/24/2014, 3:40 PM

## 2014-01-24 NOTE — Progress Notes (Signed)
Recreation Therapy Notes  Animal-Assisted Activity/Therapy (AAA/T) Program Checklist/Progress Notes Patient Eligibility Criteria Checklist & Daily Group note for Rec Tx Intervention  Date: 12.08.2015 Time: 2:45pm  Location: [address] Dayroom    AAA/T Program Assumption of Risk Form signed by Patient/ or Parent Legal Guardian yes  Patient is free of allergies or sever asthma yes  Patient reports no fear of animals yes  Patient reports no history of cruelty to animals yes  Patient understands his/her participation is voluntary yes  Patient washes hands before animal contact yes  Patient washes hands after animal contact yes  Behavioral Response: Did not attend.   Kori Colin L Wyley Hack, LRT/CTRS  Klayton Monie L 01/24/2014 4:06 PM 

## 2014-01-24 NOTE — Progress Notes (Signed)
Patient came after send a text to the girlfriend that he wanted to end it all; upon admission patient states that he just wanted to talk with someone and it got him here. Patient signed a 72 hour discharge on 01/24/14 at 1420 hrs. Patient is cooperative but really wants to leave; patient is currently denying SI/HI/A/V hallucinations; patient has no other request at this time

## 2014-01-24 NOTE — BH Assessment (Addendum)
Assessment Note  Jose BaldingJohn Hays is an 38 y.o. male who came to the emergency room after texting his girlfriend a suicide note. The note stated that he wanted to die and said goodbye to his girlfriend and her kids. He stated that this was impulsive and he was just upset in the moment but he reports that his girlfriend called the police and he came to the ED. He states that he has been having a lot of issues surrounding his relationship with his girlfriend and just hit a breaking point. Pt has no prior history of psychiatric conditions and has never seen a counselor or psychiatrist or had any inpatient admissions. Pt reports that girlfriend often cheats on him and he moved out of her house in October because of this. He states that he wants to be back with her and her 3 girls but she [place] man [place] in the house and plans to marry him within 90 days. Pt states that she is pregnant with his child and she took the other man to the Dr.'s appointment the other day and refused to take him and that's what sent him over the edge. He reports racing thoughts and poor sleep. He says that he tosses and turns and gets up a few times a night. His appetite fluctuate- some days he eats a lot others he doesn't at all.  At this moment he does not endorse SI, HI or A/V hallucinations. He says that he received an eviction notice yesterday and has a court date 01/30/14 and he is worried that he wont be able to pay and will get his apartment taken away. He states that his lights are already cut off due to not being able to pay his bills. Per Dr. Lenore CordiaAkintyo [facility].   Axis I: Major Depression, single episode Axis II: Deferred Axis III:  Past Medical History  Diagnosis Date  . Coronary artery disease   . Hypertension    Axis IV: economic problems, educational problems, housing problems and problems related to social environment Axis V: 41-50 serious symptoms  Past Medical History:  Past  Medical History  Diagnosis Date  . Coronary artery disease   . Hypertension     History reviewed. No pertinent past surgical history.  Family History: History reviewed. No pertinent family history.  Social History:  reports that he has quit smoking. He has never used smokeless tobacco. He reports that he uses illicit drugs (Cocaine). He reports that he does not drink alcohol.  Additional Social History:  Alcohol / Drug Use Pain Medications: none known History of alcohol / drug use?: Yes Longest period of sobriety (when/how long): unknown Substance #1 Name of Substance 1: Cocaine 1 - Age of First Use: unknown 1 - Amount (size/oz): unknown 1 - Frequency: once or twice a month 1 - Duration: unknown 1 - Last Use / Amount: unknown  CIWA: CIWA-Ar BP: 125/75 mmHg Pulse Rate: 80 Nausea and Vomiting: 2 COWS:    Allergies: No Known Allergies  Home Medications:  (Not in a hospital admission)  OB/GYN Status:  No LMP for male patient.  General Assessment Data Location of Assessment: [facility] ED ACT Assessment: Yes Is this a Tele or Face-to-Face Assessment?: Face-to-Face Is this an Initial Assessment or a Re-assessment for this encounter?: Initial Assessment Living Arrangements: Alone Can pt return to current living arrangement?: Yes Admission Status: Voluntary Is patient capable of signing voluntary admission?: Yes Transfer from: Home Referral Source:  Self/Family/Friend     [facility] Living Arrangements: Alone     Risk to self with the past 6 months Suicidal Ideation: Yes-Currently Present (yesterday wrote a suicidal text ) Suicidal Intent: No Is patient at risk for suicide?: Yes Suicidal Plan?:  (jump in front of car ) Access to Means: Yes Specify Access to Suicidal Means:  (access to traffic) What has been your use of drugs/alcohol within the last 12 months?:  (cocaine once or twice a month) Previous Attempts/Gestures:  (none) How many times?: 0 Other Self  Harm Risks:  (environmental stressors) Triggers for Past Attempts: None known Intentional Self Injurious Behavior: None Family Suicide History: No Recent stressful life event(s): Conflict (Comment), Loss (Comment), Financial Problems, Turmoil (Comment) Persecutory voices/beliefs?: No Depression: Yes Depression Symptoms: Despondent, Tearfulness, Loss of interest in usual pleasures, Feeling worthless/self pity Substance abuse history and/or treatment for substance abuse?: Yes Suicide prevention information given to non-admitted patients: Not applicable        Mental Status Report Appear/Hygiene: Body odor, In scrubs Eye Contact: Good Motor Activity: Unremarkable Speech: Unremarkable Level of Consciousness: Alert Mood: Depressed, Despair Affect: Depressed, Sad Anxiety Level: Moderate Thought Processes: Coherent Judgement: Partial Orientation: Appropriate for developmental age Obsessive Compulsive Thoughts/Behaviors: Moderate  Cognitive Functioning Concentration: Good Memory: Recent Intact, Remote Intact IQ: Above Average Insight: Good Impulse Control: Poor Appetite: Good Weight Loss:  (none) Weight Gain:  (none) Sleep: Decreased Total Hours of Sleep:  (less than 8) Vegetative Symptoms: Staying in bed, Not bathing  ADLScreening [facility] Assessment Services) Patient's cognitive ability adequate to safely complete daily activities?: Yes Patient able to express need for assistance with ADLs?: Yes Independently performs ADLs?: Yes (appropriate for developmental age)  Prior Inpatient Therapy Prior Inpatient Therapy: No Prior Therapy Dates:  (none) Prior Therapy Facilty/Provider(s):  (N/A) Reason for Treatment:  (N/A)  Prior Outpatient Therapy Prior Outpatient Therapy: No Prior Therapy Dates:  (none) Prior Therapy Facilty/Provider(s):  (N/A) Reason for Treatment:  (N/A)  ADL Screening (condition at time of admission) Patient's cognitive ability adequate to safely  complete daily activities?: Yes Is the patient deaf or have difficulty hearing?: No Does the patient have difficulty seeing, even when wearing glasses/contacts?: No Does the patient have difficulty concentrating, remembering, or making decisions?: No Patient able to express need for assistance with ADLs?: Yes Does the patient have difficulty dressing or bathing?: No Independently performs ADLs?: Yes (appropriate for developmental age) Does the patient have difficulty walking or climbing stairs?: No Weakness of Legs: None Weakness of Arms/Hands: None  Home Assistive Devices/Equipment Home Assistive Devices/Equipment: None      Values / Beliefs Cultural Requests During Hospitalization: None Spiritual Requests During Hospitalization: None Consults Spiritual Care Consult Needed: No Advance Directives (For Healthcare) Does patient have an advance directive?: No Would patient like information on creating an advanced directive?: No - patient declined information    Additional Information 1:1 In Past 12 Months?: No CIRT Risk: No Elopement Risk: No Does patient have medical clearance?: Yes     Disposition:  Disposition Initial Assessment Completed for this Encounter: Yes Disposition of Patient: Inpatient treatment program Type of inpatient treatment program: Adult  On Site Evaluation by:  Kateri PlummerKristin Denessa Cavan Reviewed with Physician:  Dr. Thompson CaulAkintayo  Calahan Pak 01/24/2014 10:59 AM

## 2014-01-24 NOTE — [facility] Counselor (Signed)
Pt [facility] per Maury DusEric Caplan, [facility] (Dr. Jannifer FranklinAkintayo accepting)   Kateri PlummerKristin Kenzy Campoverde, M.S., LPCA, [facility] Licensed Professional Counselor Associate  Triage Specialist  [facility]  Therapeutic Triage Services Phone: [phone] Fax: [phone]

## 2014-01-24 NOTE — [facility] Group Notes (Signed)
Adult Psychoeducational Group Note  Date:  01/24/2014 Time:  9:47 PM  Group Topic/Focus:  AA Meeting  Participation Level:  None  Participation Quality:  Drowsy  Affect:  Sleep  Cognitive:  Sleep  Insight: None  Engagement in Group:  None  Modes of Intervention:  Discussion and Education  Additional Comments:  Cecile attended group.  He slept during group and had to be redirected because of snoring.  Caroll RancherLindsay, Etola Mull A 01/24/2014, 9:47 PM

## 2014-01-25 ENCOUNTER — Encounter (HOSPITAL_COMMUNITY): Payer: Self-pay | Admitting: Psychiatry

## 2014-01-25 DIAGNOSIS — F4325 Adjustment disorder with mixed disturbance of emotions and conduct: Secondary | ICD-10-CM

## 2014-01-25 NOTE — Progress Notes (Signed)
D: Patient presents with depressed affect and mood, but a little brighter as the day progresses. He reported on the self inventory sheet that he's sleeping fair at night, appetite and ability to concentrate are both good and energy level is normal. Patient rated depression today "1" and anxiety/feelings of hopelessness "0". He's attending groups throughout the day. Patient has only one scheduled medication on this shift, but refused to take it this evening.  A: Support and encouragement provided to patient. Maintain Q15 minute checks for safety.  R: Patient receptive. Denies SI/HI and AVH. Patient remains safe on the hall.

## 2014-01-25 NOTE — [facility] Suicide Risk Assessment (Signed)
Suicide Risk Assessment  Admission Assessment     Nursing information obtained from:  Patient Demographic factors:  Male, Adolescent or young adult, Living alone Current Mental Status:  NA Loss Factors:  Loss of significant relationship (problems with girlfriend) Historical Factors:  NA Risk Reduction Factors:  Sense of responsibility to family, Religious beliefs about death, Positive social support Total Time spent with patient: 45 minutes  CLINICAL FACTORS:   Depression:   Impulsivity  COGNITIVE FEATURES THAT CONTRIBUTE TO RISK:  Closed-mindedness Polarized thinking Thought constriction (tunnel vision)    SUICIDE RISK:   Moderate:   PLAN OF CARE: Supportive approach/coping skills/CBT/mindfulness                               Assess for need of psychotropic medications  I certify that inpatient services furnished can reasonably be expected to improve the patient's condition.  Markus Casten A 01/25/2014, 2:55 PM

## 2014-01-25 NOTE — [facility] Group Notes (Signed)
   [facility] L[facility] Dis[facility] Note  01/25/2014  8:45 AM   Participation Quality: Alert, Appropriate and Oriented  Mood/Affect: Depressed and Flat  Depression Rating: 0  Anxiety Rating: 0  Thoughts of Suicide: Pt denies SI/HI  Will you contract for safety? Yes  Current AVH: Pt denies  Plan for Discharge/Comments: Pt attended discharge planning group and actively participated in group. CSW provided pt with today's workbook. Patient reports feeling "alright" today. He reports being hospitalized for suicidal threats related to relationship issues with his girlfriend. Patient is interested in outpatient services and asked to speak with CSW individually this afternoon.  Transportation Means: Pt reports access to transportation  Supports: No supports mentioned at this time  Samuella BruinKristin Ameyah Bangura, MSW, [employer]CSWA Clinical Social Worker [facility] [phone]
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
## 2014-01-26 DIAGNOSIS — F411 Generalized anxiety disorder: Secondary | ICD-10-CM
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
## 2014-01-26 NOTE — [facility] Suicide Risk Assessment (Signed)
[facility] INPATIENT:  Family/Significant Other Suicide Prevention Education  Suicide Prevention Education:  Education Completed; Nancy FetterFriend Sarah Galindo [phone],  (name of family member/significant other) has been identified by the patient as the family member/significant other with whom the patient will be residing, and identified as the person(s) who will aid the patient in the event of a mental health crisis (suicidal ideations/suicide attempt).  With written consent from the patient, the family member/significant other has been provided the following suicide prevention education, prior to the and/or following the discharge of the patient.  The suicide prevention education provided includes the following:  Suicide risk factors  Suicide prevention and interventions  National Suicide Hotline telephone number  [facility] assessment telephone number  [facility] Emergency Assistance 911  [facility] and/or Residential Mobile Crisis Unit telephone number  Request made of family/significant other to:  Remove weapons (e.g., guns, rifles, knives), all items previously/currently identified as safety concern.    Remove drugs/medications (over-the-counter, prescriptions, illicit drugs), all items previously/currently identified as a safety concern.  The family member/significant other verbalizes understanding of the suicide prevention education information provided.  The family member/significant other agrees to remove the items of safety concern listed above.  Theresa Dohrman, [place]ristin L 01/26/2014, 10:46 AM

## 2014-01-26 NOTE — Progress Notes (Signed)
[facility] MD Progress Note  01/26/2014 3:22 PM Jose BaldingJohn Hays  MRN:  [id number] Subjective:  Jose RuizJohn endorses that his GF did not come last night. She got in touch with him this moring She claimed she had been  trying to get trough to tell him she did not have enough money for gas to come last night but that she will come to see him today. He admits he was very anxious upset all night for what he did not sleep well. She assured him that nothing has changed. He states he really needs to hear if from him face to face. He feels that he is stuck until he hears from her. If he hears she does not want to be with him, he admits he does not know what he is going to do. Diagnosis:   DSM5: Depressive Disorders:  Major Depressive Disorder - Moderate (296.22) Total Time spent with patient: 30 minutes  Axis I: Generalized Anxiety Disorder  ADL's:  Intact  Sleep: Poor  Appetite:  Fair  Psychiatric Specialty Exam: Physical Exam  Review of Systems  Constitutional: Negative.   HENT: Negative.   Eyes: Negative.   Respiratory: Negative.   Cardiovascular: Negative.   Gastrointestinal: Negative.   Genitourinary: Negative.   Musculoskeletal: Negative.   Skin: Negative.   Neurological: Negative.   Endo/Heme/Allergies: Negative.   Psychiatric/Behavioral: Positive for depression. The patient is nervous/anxious.     Blood pressure 118/77, pulse 44, temperature 97.6 F (36.4 C), temperature source Oral, resp. rate 18, height 6\' 6"  (1.981 m), weight 165.563 kg (365 lb).Body mass index is 42.19 kg/(m^2).  General Appearance: Fairly Groomed  Patent attorneyye Contact::  Fair  Speech:  Clear and Coherent  Volume:  Decreased  Mood:  Anxious, Depressed and worried  Affect:  anxious worried  Thought Process:  Coherent and Goal Directed  Orientation:  Full (Time, Place, and Person)  Thought Content:  most recent events, worries concerns  Suicidal Thoughts:  No  Homicidal Thoughts:  No  Memory:  Immediate;   Fair Recent;    Fair Remote;   Fair  Judgement:  Fair  Insight:  Present  Psychomotor Activity:  Normal  Concentration:  Fair  Recall:  FiservFair  Fund of Knowledge:NA  Language: Fair  Akathisia:  No  Handed:    AIMS (if indicated):     Assets:  Desire for Improvement Housing Vocational/Educational  Sleep:  Number of Hours: 6   Musculoskeletal: Strength & Muscle Tone: within normal limits Gait & Station: normal Patient leans: N/A  Current Medications: Current Facility-Administered Medications  Medication Dose Route Frequency Provider Last Rate Last Dose  . acetaminophen (TYLENOL) tablet 650 mg  650 mg Oral Q6H PRN Sanjuana KavaAgnes I Nwoko, NP      . alum & mag hydroxide-simeth (MAALOX/MYLANTA) 200-200-20 MG/5ML suspension 30 mL  30 mL Oral Q4H PRN Sanjuana KavaAgnes I Nwoko, NP      . magnesium hydroxide (MILK OF MAGNESIA) suspension 30 mL  30 mL Oral Daily PRN Sanjuana KavaAgnes I Nwoko, NP      . nicotine (NICODERM CQ - dosed in mg/24 hours) patch 21 mg  21 mg Transdermal Q0600 Sanjuana KavaAgnes I Nwoko, NP   21 mg at 01/26/14 [id number]  . polyethylene glycol (MIRALAX / GLYCOLAX) packet 17 g  17 g Oral Daily Sanjuana KavaAgnes I Nwoko, NP   17 g at 01/24/14 1605  . traZODone (DESYREL) tablet 50 mg  50 mg Oral QHS Sanjuana KavaAgnes I Nwoko, NP   50 mg at 01/25/14 2146    Lab  Results: No results found for this or any previous visit (from the past 48 hour(s)).  Physical Findings: AIMS: Facial and Oral Movements Muscles of Facial Expression: None, normal Lips and Perioral Area: None, normal Jaw: None, normal Tongue: None, normal,Extremity Movements Upper (arms, wrists, hands, fingers): None, normal Lower (legs, knees, ankles, toes): None, normal, Trunk Movements Neck, shoulders, hips: None, normal, Overall Severity Severity of abnormal movements (highest score from questions above): None, normal Incapacitation due to abnormal movements: None, normal Patient's awareness of abnormal movements (rate only patient's report): No Awareness, Dental Status Current problems with  teeth and/or dentures?: No Does patient usually wear dentures?: No  CIWA:    COWS:     Treatment Plan Summary: Daily contact with patient to assess and evaluate symptoms and progress in treatment Medication management  Plan: Supportive approach/coping skills           Will continue to support him, work on strategies of how to handle whatever decision his           GF makes           Will continue to reassess for the need to take antidepressants ( he is still hesitant to take           medications.) Medical Decision Making Problem Points:  Established problem, worsening (2) and Review of psycho-social stressors (1) Data Points:  Review of medication regiment & side effects (2)  I certify that inpatient services furnished can reasonably be expected to improve the patient's condition.   Tiona Ruane A 01/26/2014, 3:22 PM

## 2014-01-26 NOTE — Progress Notes (Signed)
Pt has been upset most of the evening because his girlfriend did not show up at visitation time.  Pt says he talked to her this morning and she said she would come to visit tonight so that they could talk over some things.  He waited around the nurse's station and the front doors for her.  Pt was told that if she showed up before 9pm that they could visit for a short time, but that it would not be allowed after 9.  Pt tried to call her, but got no answer.  Pt asked RN if she would call, so this Clinical research associatewriter called his girlfriend's #, but it went to voice mail.  Writer left message for GF to call pt just to let him know she was ok.  As of 2300, pt has not heard from GF.  Pt denies SI/HI/AV, but is very upset and anxious.  Writer spent time talking with pt, but he was not willing to be consoled at this time.  He has been observed talking to various peers about his situation.  Writer was able to convince pt to take the trazodone scheduled for bedtime as he had initially said he did not want to take any meds.  Support and encouragement offered.  Pt makes his needs known to staff.  Safety maintained with q15 minute checks.

## 2014-01-26 NOTE — Progress Notes (Signed)
Attend group 

## 2014-01-26 NOTE — Clinical Social Work Note (Signed)
Per patient's request, CSW provided patient with information on financial assistance programs.  Samuella BruinKristin Aubriee Szeto, MSW, [employer]LCSWA Clinical Social Worker [facility] [phone]

## 2014-01-26 NOTE — Progress Notes (Signed)
D: Patient has depressed affect and mood. He reported on the self inventory sheet that he's sleeping fair, good appetite and ability to concentrate and energy level is normal. Patient rates depression, feelings of hopelessness and anxiety "0". His goal today is to work on Conservation officer, historic buildingsdeveloping coping skills. Patient is actively participating in groups and visible in the milieu. Refused morning Polyethylene Glycol.   A: Support and encouragement provided to patient. Monitor Q15 minute checks for safety.   R: Patient receptive. Denies SI/HI/AVH. Patient remains safe on the unit.

## 2014-01-26 NOTE — Progress Notes (Signed)
Patient did attend the evening karaoke group. Pt was engaged, supportive, and participated by singing several songs.   

## 2014-01-26 NOTE — [facility] Group Notes (Signed)
The focus of this group is to educate the patient on the purpose and policies of crisis stabilization and provide a format to answer questions about their admission.  The group details unit policies and expectations of patients while admitted. Patient attended this group and was engaging. 

## 2014-01-26 NOTE — [facility] Group Notes (Signed)
[facility] LCSW Group Therapy 01/26/2014 1:15 PM Type of Therapy: Group Therapy Participation Level: Active  Participation Quality: Attentive, Sharing and Supportive  Affect: Depressed and Flat  Cognitive: Alert and Oriented  Insight: Developing/Improving and Engaged  Engagement in Therapy: Developing/Improving and Engaged  Modes of Intervention: Activity, Clarification, Confrontation, Discussion, Education, Exploration, Limit-setting, Orientation, Problem-solving, Rapport Building, Reality Testing, Socialization and Support  Summary of Progress/Problems: Patient was attentive and engaged with speaker from [employer]. Patient was attentive to speaker while they shared their story of dealing with mental health and overcoming it. Patient expressed interest in their programs and services and received information on their agency. Patient processed ways they can relate to the speaker.   Termaine Roupp, MSW, LCSWA Clinical Social Worker [facility] [phone]    

## 2014-01-27 DIAGNOSIS — F321 Major depressive disorder, single episode, moderate: Secondary | ICD-10-CM | POA: Insufficient documentation

## 2014-01-27 DIAGNOSIS — F329 Major depressive disorder, single episode, unspecified: Principal | ICD-10-CM

## 2014-01-27 MED ORDER — POLYETHYLENE GLYCOL 3350 17 G PO PACK: 17.0000 g | PACK | Freq: Every day | ORAL | Status: DC

## 2014-01-27 MED ORDER — TRAZODONE HCL 50 MG PO TABS: 50.0000 mg | ORAL_TABLET | Freq: Every day | ORAL | Status: DC

## 2014-01-27 NOTE — Progress Notes (Signed)
Pt reports that he is doing better since he was able to see his girlfriend today.  He says they were able to talk about some of their issues, and that she wants to work on the relationship.  He is hopeful that they will be able to work out their problems and be able to get back together.  Pt plans to return home at discharge.  Pt denies SI/HI/AV.  He says he has been going to groups today.  He still does not want to take medications, but tells this writer that he may take the trazodone again tonight as it helped him sleep last night.  Pt has been observed talking and socializing with peers throughout the evening.  Pt makes his needs known to staff.  Support and encouragement offered.  Safety maintained with q15 minute checks.

## 2014-01-27 NOTE — Progress Notes (Signed)
Pt d/c from the hospital with a church friend. All items returned. D/C instructions given, prescriptions given and samples given. Pt denies si and hi.

## 2014-01-27 NOTE — [facility] Group Notes (Signed)
[facility] LCSW Group Therapy 01/27/2014 1:15 PM Type of Therapy: Group Therapy Participation Level: Active  Participation Quality: Attentive, Sharing and Supportive  Affect: Appropriate  Cognitive: Alert and Oriented  Insight: Developing/Improving and Engaged  Engagement in Therapy: Developing/Improving and Engaged  Modes of Intervention: Clarification, Confrontation, Discussion, Education, Exploration, Limit-setting, Orientation, Problem-solving, Rapport Building, Dance movement psychotherapisteality Testing, Socialization and Support  Summary of Progress/Problems: The topic for today was feelings about relapse. Pt discussed what relapse prevention is to them and identified triggers that they are on the path to relapse. Pt processed their feeling towards relapse and was able to relate to peers. Pt discussed coping skills that can be used for relapse prevention. Patient discussed feelings of frustration related to feeling like he is not receiving assistance with his financial needs from CSW. CSW's validated patient's feelings and explained CSW roles and limitations. Patient reported that his hospitalization has been helpful in some ways. CSW's provided emotional support and encouragement.   Samuella BruinKristin Braeden Kennan, MSW, [employer]LCSWA Clinical Social Worker [facility] [phone]

## 2014-01-27 NOTE — Clinical Social Work Note (Signed)
CSW met with patient at 1 pm to discuss discharge plans. Patient reports that he does not know if he is discharging today or over the weekend. He reports feeling frustrated that CSW cannot assist him with getting his utilities turned on. CSW has provided patient with listing of local community agencies that may be able to assist but patient reports that it is not helpful. CSW validated patient's concerns and educated patient on CSW's role and limitations. Patient continues to report feeling frustrated that he is not receiving assistance and would like to stay in the hospital until he figures out how to get his electricity turned back on. CSW informed patient that his discharge cannot be delayed due to utilities being turned off. Patient discussed that he planned on discussing his discharge with MD later this afternoon.  Tilden Fossa, MSW, [place] Worker [facility] [phone]

## 2014-01-27 NOTE — Progress Notes (Signed)
[facility] :  Will you be returning to the same living situation after discharge: Yes,  patient plans to return to his apartment At discharge, do you have transportation home?:Yes,  patient reports that he has access to transportation. Patient will be provided with a bus pass if he is unable to find transportation. Do you have the ability to pay for your medications:Yes,  patient will be provided with necessary medication samples and prescriptions at discharge.  Release of information consent forms completed and in the chart;  Patient's signature needed at discharge.  Patient to Follow up at: Follow-up Information    [facility]   Specialty:  Behavioral Health   Why:  Please follow up with [facility] walk-in clinic Monday-Friday between 8am to 3pm for assessment for therapy and medication management services.   Contact information:   [address] [place] [address] [phone]       Patient denies SI/HI:   Yes,  denies    Safety Planning and Suicide Prevention discussed:  Yes,  with patient and girlfriend  Sotiria Keast, [place] CarboKristin L 01/27/2014, 4:18 PM
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
## 2014-01-27 NOTE — [facility] Group Notes (Signed)
   [facility] LCSW Aftercare Discharge Planning Group Note  01/27/2014  8:45 AM   Participation Quality: Alert, Appropriate and Oriented  Mood/Affect: Appropriate  Depression Rating: 0  Anxiety Rating: 1-2  Thoughts of Suicide: Pt denies SI/HI  Will you contract for safety? Yes  Current AVH: Pt denies  Plan for Discharge/Comments: Pt attended discharge planning group and actively participated in group. CSW provided pt with today's workbook. Patient reports feeling uncertain about his discharge plans. Patient has signed a 72 hour release that will expire today. CSW to clarify plans with patient individually after group.  Transportation Means: Continuing to assess  Supports: No supports mentioned at this time  Samuella BruinKristin Khloi Rawl, MSW, [employer]CSWA Clinical Social Worker [facility] [phone]
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
## 2014-01-27 NOTE — Tx Team (Signed)
Interdisciplinary Treatment Plan Update (Adult) Date: 01/27/2014   Time Reviewed: 9:30 AM  Progress in Treatment: Attending groups: Yes Participating in groups: Yes Taking medication as prescribed: Yes Tolerating medication: Yes Family/Significant other contact made: Yes, CSW has spoken with patient's girlfriend Patient understands diagnosis: Yes Discussing patient identified problems/goals with staff: Yes Medical problems stabilized or resolved: Yes Denies suicidal/homicidal ideation: Yes Issues/concerns per patient self-inventory: Yes Other:  New problem(s) identified: N/A  Discharge Plan or Barriers: Patient plans to return home to [facility] at discharge. CSW has provided patient with information on local financial assistance programs.  Reason for Continuation of Hospitalization:  Depression Anxiety Medication Stabilization   Comments: N/A  Estimated length of stay: 3-5 days  For review of initial/current patient goals, please see plan of care. Patient is a 38 year old African American Male with a diagnosis of PTSD, MDD, and Adjustment Disorder. [place], as he and his girlfriend are currently separated. Patient reports that he is hospitalized as a result of threatening suicide following an argument with his girlfriend. Patient identifies his relationship as a major stressor for him as he feels as though his love and commitment are not being reciprocated. Patient identifies his goals for treatment as "to become stronger and learn tools to cope." Patient plans to return home at discharge to [facility]. Patient will benefit from crisis stabilization, medication evaluation, group therapy, and psycho education in addition to case management for discharge planning. Patient and CSW reviewed pt's identified goals and treatment plan. Pt verbalized understanding and agreed to treatment  plan.  Attendees: Patient:    Family:    Physician: Dr. Dub MikesLugo 01/27/2014 9:30 AM  Nursing: Waynetta SandyJan Wright, RN 01/27/2014 9:30 AM  Clinical Social Worker: Belenda CruiseKristin Alver Leete, LCSWA 01/27/2014 9:30 AM  Other: Juline PatchQuylle Hodnett, LCSW 01/27/2014 9:30 AM  Other: Santa GeneraAnne Cunningham, LCSW 01/27/2014 9:30 AM  Other: Onnie BoerJennifer Clark, Case Manager 01/27/2014 9:30 AM  Other:  01/27/2014 9:30 AM  Other:    Other:      Scribe for Treatment Team:  Samuella BruinKristin Monike Bragdon, MSW, [employer] IncLCSWA [phone]

## 2014-01-27 NOTE — Progress Notes (Signed)
D:Pt rates his depression, hopelessness and anxiety as a 0 today. He reports that the groups have helped since coming to the hospital. Pt reports that sending the text before admission was an impulsive thing to do. He plans to to follow up with outpatient therapy following discharge.  A:Offered support, encouragement and 15 minute checks.  R:Pt denies si and hi. Safety maintained on the unit.

## 2014-02-01 NOTE — Progress Notes (Signed)
Patient Discharge Instructions:  After Visit Summary (AVS):   Faxed to:  02/01/14 Discharge Summary Note:   Faxed to:  02/01/14 Psychiatric Admission Assessment Note:   Faxed to:  02/01/14 Suicide Risk Assessment - Discharge Assessment:   Faxed to:  02/01/14 Faxed/Sent to the Next Level Care provider:  02/01/14 [facility] to [facility] @ [phone]  Jerelene ReddenSheena E [place], 02/01/2014, 3:36 PM

## 2014-03-07 ENCOUNTER — Emergency Department (HOSPITAL_COMMUNITY)
Admission: EM | Admit: 2014-03-07 | Discharge: 2014-03-07 | Disposition: A | Discharge status: Home / Self Care | Payer: Self-pay | Attending: Emergency Medicine | Admitting: Emergency Medicine

## 2014-03-07 ENCOUNTER — Encounter (HOSPITAL_COMMUNITY): Payer: Self-pay | Admitting: Neurology

## 2014-03-07 DIAGNOSIS — K0381 Cracked tooth: Secondary | ICD-10-CM | POA: Insufficient documentation

## 2014-03-07 DIAGNOSIS — I2510 Atherosclerotic heart disease of native coronary artery without angina pectoris: Secondary | ICD-10-CM | POA: Insufficient documentation

## 2014-03-07 DIAGNOSIS — K088 Other specified disorders of teeth and supporting structures: Secondary | ICD-10-CM | POA: Insufficient documentation

## 2014-03-07 DIAGNOSIS — I10 Essential (primary) hypertension: Secondary | ICD-10-CM | POA: Insufficient documentation

## 2014-03-07 DIAGNOSIS — Z720 Tobacco use: Secondary | ICD-10-CM | POA: Insufficient documentation

## 2014-03-07 DIAGNOSIS — K002 Abnormalities of size and form of teeth: Secondary | ICD-10-CM | POA: Insufficient documentation

## 2014-03-07 DIAGNOSIS — K0889 Other specified disorders of teeth and supporting structures: Secondary | ICD-10-CM

## 2014-03-07 DIAGNOSIS — Z79899 Other long term (current) drug therapy: Secondary | ICD-10-CM | POA: Insufficient documentation

## 2014-03-07 DIAGNOSIS — K029 Dental caries, unspecified: Secondary | ICD-10-CM | POA: Insufficient documentation

## 2014-03-07 MED ORDER — PENICILLIN V POTASSIUM 250 MG PO TABS
500.0000 mg | ORAL_TABLET | Freq: Once | ORAL | Status: AC
  Administered 2014-03-07: 500 mg via ORAL
  Filled 2014-03-07: qty 2

## 2014-03-07 MED ORDER — HYDROCODONE-ACETAMINOPHEN 5-325 MG PO TABS: 1.0000 | ORAL_TABLET | ORAL | Status: DC | PRN

## 2014-03-07 MED ORDER — HYDROCODONE-ACETAMINOPHEN 5-325 MG PO TABS
2.0000 | ORAL_TABLET | Freq: Once | ORAL | Status: AC
  Administered 2014-03-07: 2 via ORAL
  Filled 2014-03-07: qty 2

## 2014-03-07 MED ORDER — BENZOCAINE 10 % MT GEL
Freq: Four times a day (QID) | OROMUCOSAL | Status: DC | PRN
  Administered 2014-03-07: 18:00:00 via OROMUCOSAL
  Filled 2014-03-07: qty 9.4

## 2014-03-07 MED ORDER — PENICILLIN V POTASSIUM 500 MG PO TABS: 500.0000 mg | ORAL_TABLET | Freq: Four times a day (QID) | ORAL | Status: AC

## 2014-03-07 NOTE — ED Provider Notes (Signed)
CSN: [id number]     Arrival date & time 03/07/14  1701 History   First MD Initiated Contact with Patient 03/07/14 1710     Chief Complaint  Patient presents with  . Facial Pain     (Consider location/radiation/quality/duration/timing/severity/associated sxs/prior Treatment) The history is provided by the patient and medical records.    This is a 39 y.o. M with PMH significant for CAD, HTN, presenting to the ED for right lower dental pain that is making the entire right side of his face hurt. Patient has a broken right lower molar, states has been broken for several months but is not "issues until recently.  Patient states been ongoing for the past 24 hours, and has progressively gotten worse.  He denies any fever or chills. No facial or neck swelling.  No intervention tried prior to arrival. Patient is not currently established with a dentist.  Past Medical History  Diagnosis Date  . Coronary artery disease   . Hypertension    History reviewed. No pertinent past surgical history. No family history on file. History  Substance Use Topics  . Smoking status: Current Every Day Smoker  . Smokeless tobacco: Never Used  . Alcohol Use: No    Review of Systems  HENT: Positive for dental problem.   All other systems reviewed and are negative.     Allergies  Review of patient's allergies indicates no known allergies.  Home Medications   Prior to Admission medications   Medication Sig Start Date End Date Taking? Authorizing Provider  polyethylene glycol (MIRALAX / GLYCOLAX) packet Take 17 g by mouth daily. For constipation 01/27/14   Sanjuana Kava, NP  traZODone (DESYREL) 50 MG tablet Take 1 tablet (50 mg total) by mouth at bedtime. For sleep 01/27/14   Sanjuana Kava, NP   BP 140/85 mmHg  Pulse 94  Temp(Src) 98.1 F (36.7 C) (Oral)  Resp 19  SpO2 98%   Physical Exam  Constitutional: He is oriented to person, place, and time. He appears well-developed and well-nourished.    HENT:  Head: Normocephalic and atraumatic.  Mouth/Throat: Uvula is midline, oropharynx is clear and moist and mucous membranes are normal. Abnormal dentition. Dental caries present. No dental abscesses. No oropharyngeal exudate, posterior oropharyngeal edema, posterior oropharyngeal erythema or tonsillar abscesses.    Teeth largely in poor dentition, right lower molar broken with large cavity present, surrounding gingiva swollen and erythematous without appreciable fluid collection or fluctuance, handling secretions appropriately, no trismus  Eyes: Conjunctivae and EOM are normal. Pupils are equal, round, and reactive to light.  Neck: Normal range of motion.  Cardiovascular: Normal rate, regular rhythm and normal heart sounds.   Pulmonary/Chest: Effort normal and breath sounds normal.  Abdominal: Soft. Bowel sounds are normal.  Musculoskeletal: Normal range of motion.  Neurological: He is alert and oriented to person, place, and time.  Skin: Skin is warm and dry.  Psychiatric: He has a normal mood and affect.  Nursing note and vitals reviewed.   ED Course  Procedures (including critical care time) Labs Review Labs Reviewed - No data to display  Imaging Review No results found.   EKG Interpretation None      MDM   Final diagnoses:  Pain, dental   Right lower dental pain with likely developing dental abscess. Patient afebrile and nontoxic, no facial or neck swelling. Patient restarted on pain medication and antibiotics. Encouraged follow-up with dentist, resource guide given.  Discussed plan with patient, he/she acknowledged understanding and agreed  with plan of care.  Return precautions given for new or worsening symptoms.  Garlon HatchetLisa M Sanders, PA-C 03/07/14 1813  Arby BarretteMarcy Pfeiffer, MD 03/07/14 2219

## 2014-03-07 NOTE — ED Notes (Signed)
Pharmacy aware of order for Orajel.

## 2014-03-07 NOTE — ED Notes (Signed)
Pt reports right sided facial pain since yesterday. Thinks it could be coming from a tooth. Denies fevers.

## 2014-03-07 NOTE — Discharge Instructions (Signed)
Take the prescribed medication as directed. Follow-up with dentist.  Resource guide attached. Return to the ED for new or worsening symptoms.   Emergency Department Resource Guide 1) Find a Doctor and Pay Out of Pocket Although you won't have to find out who is covered by your insurance plan, it is a good idea to ask around and get recommendations. You will then need to call the office and see if the doctor you have chosen will accept you as a new patient and what types of options they offer for patients who are self-pay. Some doctors offer discounts or will set up payment plans for their patients who do not have insurance, but you will need to ask so you aren't surprised when you get to your appointment.  2) Contact Your Local Health Department Not all health departments have doctors that can see patients for sick visits, but many do, so it is worth a call to see if yours does. If you don't know where your local health department is, you can check in your phone book. The CDC also has a tool to help you locate your state's health department, and many state websites also have listings of all of their local health departments.  3) Find a Walk-in Clinic If your illness is not likely to be very severe or complicated, you may want to try a walk in clinic. These are popping up all over the country in pharmacies, drugstores, and shopping centers. They're usually staffed by nurse practitioners or physician assistants that have been trained to treat common illnesses and complaints. They're usually fairly quick and inexpensive. However, if you have serious medical issues or chronic medical problems, these are probably not your best option.  No Primary Care Doctor: - Call Health Connect at  [phone] - they can help you locate a primary care doctor that  accepts your insurance, provides certain services, etc. - Physician Referral Service- [phone]  Chronic Pain Problems: Organization          Address  Phone   Notes  [facility]  [phone] Patients need to be referred by their primary care doctor.   Medication Assistance: Organization         Address  Phone   Notes  [facility] [address] [place], [place] [address] [phone] --Must be a resident of [place] -- Must have NO insurance coverage whatsoever (no Medicaid/ Medicare, etc.) -- The pt. MUST have a primary care doctor that directs their care regularly and follows them in the community   MedAssist  [phone]   [employer]  [phone]    Agencies that provide inexpensive medical care: Organization         Address  Phone   Notes  [facility]  [phone]   [facility]    [phone]   [facility] [address] [place], [place] [address] [phone]   [facility] [address], [place] [phone]   [facility]    [phone]   [facility]    [phone]   [facility]  [address], [place] Phone:  [phone], Fax:  [phone] Hours of Operation:  9 am - 6 pm, M-F.  Also accepts Medicaid/Medicare and self-pay.  [facility]  [address], [place] Phone: [phone],  Fax: [phone]. Hours of Operation:  8:30 am - 5:30 pm, M-F.  Also accepts Medicaid and self-pay.  [facility] [place] [address], [place] Phone: [phone]   [place], [place], [place] [phone], Ext. [phone] Mondays & Thursdays: 7-9 AM.  First 15 patients are seen on a first come, first serve basis.    [place] Providers:  Organization         Address  Phone   Notes  [place] [facility] [address], [place] [phone] Also accepts self-pay patients.  [facility] [address], [place]  [phone]   [place], [address], [place] [phone]   [facility] [address] [place] [phone]   Lucianne Lei [address], [place]   [phone] Only accepts [place] Access [place] patients after they have their name applied to their card.   Self-Pay (no insurance) in [facility]:  Organization         Address  Phone   Notes  Sickle Cell Patients, [facility] [place] [phone]   [facility] [place] [phone]   [facility] [place]  [place], [place], [place] [phone]   [facility]/Dr. Osei-Bonsu  [address], [place] or [address], [place] [phone] Phone number for both [place] and [place] locations is the same.  [facility] [address], [place] [phone]   [facility] [address] [place] or [address] [phone]   [facility] [address] [place] [phone], phone; [phone], fax Sees patients 1st and 3rd Saturday of every month.  Must not qualify for public or private insurance (i.e. Medicaid, Medicare, [place] Health Choice, Veterans' Benefits)  Household income should be no more than 200% of the poverty level The clinic cannot treat you if you are pregnant or think you are pregnant  Sexually transmitted diseases are not treated at the clinic.    Dental Care: Organization         Address  Phone  Notes  [facility] [place] [phone] Accepts children up to age 36 who are enrolled in [place] or [place]; pregnant women with a Medicaid card; and  children who have applied for Medicaid or [place] Health Choice, but were declined, whose parents can pay a reduced fee at time of service.  [facility] Department of [facility] [place]  [address], [place] [phone] Accepts children up to age 67 who are enrolled in [place] or [place]; pregnant women with a Medicaid card; and children who have applied for Medicaid or [place] Health Choice, but were declined, whose parents can pay a reduced fee at time of service.  [place] Adult Dental Access PROGRAM  [place] [phone] Patients are seen by appointment only. Walk-ins are not accepted. [place] will see patients 26 years of age and older. Monday - Tuesday (8am-5pm) Most Wednesdays (8:30-5pm) $30 per visit, cash only  [place] [facility] [employer] PRO[facility]  [address], [employer] (  [phone] Patients are seen by appointment only. Walk-ins are not accepted. [place] will see patients 54 years of age and older. One Wednesday Evening (Monthly: Volunteer Based).  $30 per visit, cash only  [place]  [phone] for adults; Children under age 63, call Graduate Pediatric Dentistry at [phone]. Children aged 44-14, please call [phone] to request a pediatric application.  Dental services are provided in all areas of dental care including fillings, crowns and bridges, complete and partial dentures, implants, gum treatment, root canals, and extractions. Preventive care is also provided. Treatment is provided to both adults and children. Patients are selected via a lottery and there is often a waiting list.   [facility] [address] [place]  [phone] [email or web address]   [facility] [address] [place], [place] [phone], Ext. [phone] Second and Fourth Thursday of each month, opens at 6:30 AM; Clinic ends at 9 AM.  Patients are seen on a first-come first-served  basis, and a limited number are seen during each clinic.   [facility] [place]  [address] Hillard Danker [place], [place] [phone]   Eligibility Requirements You must have lived in [place], [place], or [place] counties for at least the last three months.   You cannot be eligible for state or federal sponsored [employer], including [employer], [place], or [employer].   You generally cannot be eligible for healthcare insurance through your employer.    How to apply: Eligibility screenings are held every Tuesday and Wednesday afternoon from 1:00 pm until 4:00 pm. You do not need an appointment for the interview!  [facility] [address], [place], [place]   [place]  [place] Department  [place]  [phone]    Behavioral Health Resources in the Community: Intensive Outpatient Programs Organization         Address  Phone  Notes  [place]. [address], [place], [place] [phone]   [facility] [address] [place], [place]   ADS: [facility] [address], [place], [place]   [place] [address]  [place], [place] or [phone]   Substance Abuse Resources Organization         Address  Phone  Notes  Alcohol and Drug Services  [phone]   [place]  [phone]   [facility] [place]   Chinita Pester  [phone]   Residential & Outpatient Substance Abuse Program  [phone]   Psychological Services Organization         Address  Phone  Notes  [facility]  [place]  [phone]   [place] [address], [place] or [phone]    Mobile Crisis Teams Organization          Address  Phone  Notes  [facility], Mobile Crisis Care Unit  [phone]   [facility]  [address] [place], [place]   [place] [address] [place] [phone]    Self-Help/Support Groups Organization         Address  Phone             Notes  [place]. of [place] - variety of support groups  [phone]- [phone] Call for more information  °Narcotics Anonymous (NA), [facility] [address], °[place]  2 meetings at this location  ° °Residential Treatment Programs °Organization         Address  Phone  Notes  °[facility] [address],    °[place]  [phone]   °[facility] ° [address], [place], [place] [phone]   °[facility] [address], [place] [phone] Admissions: 8am-3pm M-F  °[facility] [address]    °[place], [place] [phone]   °[facility] [address], [place], [place] [phone]   °[facility] [address]  °[place], [place] [phone]   °[facility] - Intensive Outpatient [address], [place], [place] [phone]   °[facility] [address]  °[place], [place] 1-877-615-2722 or [phone]   °[facility] ([facility]) [address] [place], [place] [phone] Accepts Medicaid  °[facility] [address]  °[place] 1-800-659-3381 Substance Abuse/Addiction Treatment  ° °[place] Behavioral Health Resources °Organization         Address  Phone  Notes  °[facility]  (888) [phone]   °Julie Brannon, PhD [address] [place], [place]   (336) [phone] or (336) 951-0000   °[facility]   [address] °[place], [place] (336) 349-4454   °[facility] [address], [place], [place] (336) [phone] Insurance/Medicaid/sponsorship  through [facility]  °[facility] [address]                                    [place], [place] ([phone]) [phone] Therapy/tele-psych/case  °[facility] [address]  ° , [employer] (336) [phone]    °Dr. Arfeen  ([phone]) [phone]   °[facility] [place]  [employer] [place] [facility] 1) [address],  °2) [address], [place] °3)  [address], [place] (336) [phone] °(336) [phone] ° °(336) [phone]   °[place] Child Abuse Hotline (336) 342-1394 or (336) 342-3537 (After Hours)    ° ° ° °

## 2014-03-07 NOTE — ED Notes (Signed)
Patient reports has small amount of pain last evening under his left jaw. States woke this morning and pain has steadily gotten worse all day. Has broken tooth. Asks us to please pull the tooth to stop the pain

## 2014-04-23 ENCOUNTER — Emergency Department (INDEPENDENT_AMBULATORY_CARE_PROVIDER_SITE_OTHER)
Admission: EM | Admit: 2014-04-23 | Discharge: 2014-04-23 | Disposition: A | Discharge status: Home / Self Care | Payer: Self-pay | Source: Home / Self Care | Attending: Family Medicine | Admitting: Family Medicine

## 2014-04-23 ENCOUNTER — Encounter (HOSPITAL_COMMUNITY): Payer: Self-pay

## 2014-04-23 DIAGNOSIS — K047 Periapical abscess without sinus: Secondary | ICD-10-CM

## 2014-04-23 MED ORDER — CLINDAMYCIN HCL 300 MG PO CAPS: 300.0000 mg | ORAL_CAPSULE | Freq: Three times a day (TID) | ORAL | Status: DC

## 2014-04-23 MED ORDER — KETOROLAC TROMETHAMINE 60 MG/2ML IM SOLN
60.0000 mg | Freq: Once | INTRAMUSCULAR | Status: AC
  Administered 2014-04-23: 60 mg via INTRAMUSCULAR

## 2014-04-23 MED ORDER — KETOROLAC TROMETHAMINE 60 MG/2ML IM SOLN
INTRAMUSCULAR | Status: AC
  Filled 2014-04-23: qty 2

## 2014-04-23 MED ORDER — DICLOFENAC POTASSIUM 50 MG PO TABS: 50.0000 mg | ORAL_TABLET | Freq: Three times a day (TID) | ORAL | Status: DC

## 2014-04-23 NOTE — ED Provider Notes (Signed)
CSN: [id number]     Arrival date & time 04/23/14  1427 History   First MD Initiated Contact with Patient 04/23/14 1544     Chief Complaint  Patient presents with  . Dental Pain   (Consider location/radiation/quality/duration/timing/severity/associated sxs/prior Treatment) Patient is a 39 y.o. male presenting with tooth pain. The history is provided by the patient.  Dental Pain Location:  Lower Lower teeth location:  32/RL 3rd molar Quality:  Throbbing Severity:  Moderate Onset quality:  Gradual Duration:  1 week Progression:  Worsening Chronicity:  New Context: dental caries, dental fracture and poor dentition   Associated symptoms: facial swelling   Risk factors: lack of dental care, periodontal disease and smoking     Past Medical History  Diagnosis Date  . Coronary artery disease   . Hypertension    History reviewed. No pertinent past surgical history. History reviewed. No pertinent family history. History  Substance Use Topics  . Smoking status: Current Every Day Smoker  . Smokeless tobacco: Never Used  . Alcohol Use: No    Review of Systems  HENT: Positive for dental problem and facial swelling.     Allergies  Review of patient's allergies indicates no known allergies.  Home Medications   Prior to Admission medications   Medication Sig Start Date End Date Taking? Authorizing Provider  clindamycin (CLEOCIN) 300 MG capsule Take 1 capsule (300 mg total) by mouth 3 (three) times daily. 04/23/14   Linna HoffJames D Jadis Mika, MD  diclofenac (CATAFLAM) 50 MG tablet Take 1 tablet (50 mg total) by mouth 3 (three) times daily. For dental pain 04/23/14   Linna HoffJames D Zykee Avakian, MD  HYDROcodone-acetaminophen (NORCO/VICODIN) 5-325 MG per tablet Take 1 tablet by mouth every 4 (four) hours as needed. 03/07/14   Garlon HatchetLisa M Sanders, PA-C  polyethylene glycol [facility](MIRALAX / Ethelene HalGLYCOLAX) packet Take 17 g by mouth daily. For constipation Patient not taking: Reported on 03/07/2014 01/27/14   Sanjuana KavaAgnes I Nwoko, NP    traZODone (DESYREL) 50 MG tablet Take 1 tablet (50 mg total) by mouth at bedtime. For sleep Patient not taking: Reported on 03/07/2014 01/27/14   Sanjuana KavaAgnes I Nwoko, NP   There were no vitals taken for this visit. Physical Exam  Constitutional: He is oriented to person, place, and time. He appears well-developed and well-nourished. He appears distressed.  HENT:  Head: Normocephalic.  Right Ear: External ear normal.  Left Ear: External ear normal.  Mouth/Throat: Uvula is midline, oropharynx is clear and moist and mucous membranes are normal. Abnormal dentition. Dental abscesses and dental caries present. No uvula swelling.    Neck: Normal range of motion. Neck supple.  Lymphadenopathy:    He has cervical adenopathy.  Neurological: He is alert and oriented to person, place, and time.  Skin: Skin is warm and dry.  Nursing note and vitals reviewed.   ED Course  Procedures (including critical care time) Labs Review Labs Reviewed - No data to display  Imaging Review No results found.   MDM   1. Dental abscess        Linna HoffJames D Anali Cabanilla, MD 04/23/14 [phone]

## 2014-04-23 NOTE — ED Notes (Signed)
i have a bad tooth

## 2014-09-07 ENCOUNTER — Encounter (HOSPITAL_COMMUNITY): Payer: Self-pay

## 2014-09-07 ENCOUNTER — Emergency Department (HOSPITAL_COMMUNITY)
Admission: EM | Admit: 2014-09-07 | Discharge: 2014-09-07 | Disposition: A | Discharge status: Other Acute Inpatient Hospital | Payer: Self-pay | Attending: Emergency Medicine | Admitting: Emergency Medicine

## 2014-09-07 ENCOUNTER — Inpatient Hospital Stay (HOSPITAL_COMMUNITY)
Admission: AD | Admit: 2014-09-07 | Discharge: 2014-09-15 | DRG: 885 | Disposition: A | Discharge status: Home / Self Care | Payer: Federal, State, Local not specified - Other | Source: Intra-hospital | Attending: Psychiatry | Admitting: Psychiatry

## 2014-09-07 ENCOUNTER — Encounter (HOSPITAL_COMMUNITY): Payer: Self-pay | Admitting: *Deleted

## 2014-09-07 DIAGNOSIS — R45851 Suicidal ideations: Secondary | ICD-10-CM | POA: Diagnosis not present

## 2014-09-07 DIAGNOSIS — R05 Cough: Secondary | ICD-10-CM | POA: Insufficient documentation

## 2014-09-07 DIAGNOSIS — F1210 Cannabis abuse, uncomplicated: Secondary | ICD-10-CM | POA: Insufficient documentation

## 2014-09-07 DIAGNOSIS — F332 Major depressive disorder, recurrent severe without psychotic features: Secondary | ICD-10-CM | POA: Diagnosis not present

## 2014-09-07 DIAGNOSIS — Z791 Long term (current) use of non-steroidal anti-inflammatories (NSAID): Secondary | ICD-10-CM | POA: Insufficient documentation

## 2014-09-07 DIAGNOSIS — F1414 Cocaine abuse with cocaine-induced mood disorder: Secondary | ICD-10-CM | POA: Diagnosis present

## 2014-09-07 DIAGNOSIS — Z79899 Other long term (current) drug therapy: Secondary | ICD-10-CM | POA: Insufficient documentation

## 2014-09-07 DIAGNOSIS — Z720 Tobacco use: Secondary | ICD-10-CM | POA: Insufficient documentation

## 2014-09-07 DIAGNOSIS — F39 Unspecified mood [affective] disorder: Secondary | ICD-10-CM | POA: Diagnosis present

## 2014-09-07 DIAGNOSIS — F17200 Nicotine dependence, unspecified, uncomplicated: Secondary | ICD-10-CM | POA: Diagnosis present

## 2014-09-07 DIAGNOSIS — I10 Essential (primary) hypertension: Secondary | ICD-10-CM | POA: Diagnosis present

## 2014-09-07 DIAGNOSIS — Z792 Long term (current) use of antibiotics: Secondary | ICD-10-CM | POA: Insufficient documentation

## 2014-09-07 DIAGNOSIS — I2510 Atherosclerotic heart disease of native coronary artery without angina pectoris: Secondary | ICD-10-CM | POA: Insufficient documentation

## 2014-09-07 DIAGNOSIS — F1910 Other psychoactive substance abuse, uncomplicated: Secondary | ICD-10-CM

## 2014-09-07 DIAGNOSIS — F1410 Cocaine abuse, uncomplicated: Secondary | ICD-10-CM | POA: Insufficient documentation

## 2014-09-07 DIAGNOSIS — F329 Major depressive disorder, single episode, unspecified: Secondary | ICD-10-CM | POA: Diagnosis present

## 2014-09-07 LAB — CBC
HCT: 44.5 % (ref 39.0–52.0)
HEMOGLOBIN: 14.6 g/dL (ref 13.0–17.0)
MCH: 27.4 pg (ref 26.0–34.0)
MCHC: 32.8 g/dL (ref 30.0–36.0)
MCV: 83.5 fL (ref 78.0–100.0)
Platelets: 173 10*3/uL (ref 150–400)
RBC: 5.33 MIL/uL (ref 4.22–5.81)
RDW: 14 % (ref 11.5–15.5)
WBC: 8.3 10*3/uL (ref 4.0–10.5)

## 2014-09-07 LAB — COMPREHENSIVE METABOLIC PANEL
ALK PHOS: 44 U/L (ref 38–126)
ALT: 28 U/L (ref 17–63)
ANION GAP: 6 (ref 5–15)
AST: 32 U/L (ref 15–41)
Albumin: 4.3 g/dL (ref 3.5–5.0)
BUN: 13 mg/dL (ref 6–20)
CO2: 26 mmol/L (ref 22–32)
Calcium: 9.2 mg/dL (ref 8.9–10.3)
Chloride: 107 mmol/L (ref 101–111)
Creatinine, Ser: 1.31 mg/dL — ABNORMAL HIGH (ref 0.61–1.24)
GFR calc Af Amer: >60 mL/min (ref >60)
GFR calc non Af Amer: >60 mL/min (ref >60)
Glucose, Bld: 92 mg/dL (ref 65–99)
POTASSIUM: 3.9 mmol/L (ref 3.5–5.1)
Sodium: 139 mmol/L (ref 135–145)
TOTAL PROTEIN: 7.3 g/dL (ref 6.5–8.1)
Total Bilirubin: 1.3 mg/dL — ABNORMAL HIGH (ref 0.3–1.2)

## 2014-09-07 LAB — RAPID URINE DRUG SCREEN, HOSP PERFORMED
Amphetamines: NOT DETECTED
Barbiturates: NOT DETECTED
Benzodiazepines: NOT DETECTED
Cocaine: POSITIVE — AB
OPIATES: NOT DETECTED
Tetrahydrocannabinol: POSITIVE — AB

## 2014-09-07 LAB — ACETAMINOPHEN LEVEL: Acetaminophen (Tylenol), Serum: <10 ug/mL — ABNORMAL LOW (ref 10–30)

## 2014-09-07 LAB — SALICYLATE LEVEL: Salicylate Lvl: <4 mg/dL (ref 2.8–30.0)

## 2014-09-07 LAB — ETHANOL

## 2014-09-07 MED ORDER — ZOLPIDEM TARTRATE 5 MG PO TABS
5.0000 mg | ORAL_TABLET | Freq: Every evening | ORAL | Status: DC | PRN
  Filled 2014-09-07 (×2): qty 1

## 2014-09-07 MED ORDER — ALUM & MAG HYDROXIDE-SIMETH 200-200-20 MG/5ML PO SUSP: 30.0000 mL | ORAL | Status: DC | PRN

## 2014-09-07 MED ORDER — ACETAMINOPHEN 325 MG PO TABS: 650.0000 mg | ORAL_TABLET | ORAL | Status: DC | PRN

## 2014-09-07 MED ORDER — IBUPROFEN 600 MG PO TABS
600.0000 mg | ORAL_TABLET | Freq: Three times a day (TID) | ORAL | Status: DC | PRN
  Administered 2014-09-13: 600 mg via ORAL
  Filled 2014-09-07 (×2): qty 1

## 2014-09-07 MED ORDER — NICOTINE 21 MG/24HR TD PT24
21.0000 mg | MEDICATED_PATCH | Freq: Every day | TRANSDERMAL | Status: DC | PRN
Start: 2014-09-07 — End: 2014-09-15
  Administered 2014-09-11: 21 mg via TRANSDERMAL
  Filled 2014-09-07: qty 14

## 2014-09-07 MED ORDER — MAGNESIUM HYDROXIDE 400 MG/5ML PO SUSP: 30.0000 mL | Freq: Every day | ORAL | Status: DC | PRN

## 2014-09-07 MED ORDER — ONDANSETRON HCL 4 MG PO TABS: 4.0000 mg | ORAL_TABLET | Freq: Three times a day (TID) | ORAL | Status: DC | PRN

## 2014-09-07 MED ORDER — NICOTINE 21 MG/24HR TD PT24: 21.0000 mg | MEDICATED_PATCH | Freq: Every day | TRANSDERMAL | Status: DC | PRN

## 2014-09-07 MED ORDER — IBUPROFEN 200 MG PO TABS: 600.0000 mg | ORAL_TABLET | Freq: Three times a day (TID) | ORAL | Status: DC | PRN

## 2014-09-07 MED ORDER — ZOLPIDEM TARTRATE 5 MG PO TABS: 5.0000 mg | ORAL_TABLET | Freq: Every evening | ORAL | Status: DC | PRN

## 2014-09-07 NOTE — BHH Counselor (Signed)
Counselor talked to [place], Charity fundraiser, about putting telepsych machine in pt's room. Counselor was informed that, due to the high acuity milieu, that would not be possible. Counselor will travel to [facility] to see patient.   Jose Hays. Ladona Ridgel, MS, NCC Disposition Counselor

## 2014-09-07 NOTE — ED Notes (Signed)
Pt presents with c/o suicidal ideation. Pt reports that he has been thinking about killing himself by thinking about jumping out in front of a car. Pt reports his "life is not going the way it should right now, I feel alone and lonely all the time and just ready to check out". Pt denies any HI at this time but reports that he has had thoughts of this in the past for a certain person. Pt is calm and cooperative at this time.

## 2014-09-07 NOTE — BHH Counselor (Signed)
Counselor talked to EDP Wentz to gain additional information about pt. EDP shared that pt presents w/ SI, ongoing issues w/ GF, and psychosocial stressors, as well as SA (cocaine, THC).   Johny Shock. Ladona Ridgel, MS, NCC Disposition Counselor

## 2014-09-07 NOTE — BHH Counselor (Addendum)
Pt meets IP criteria per Julieanne Cotton, NP. Pt has been accepted at [facility], [address] per Inetta Fermo, [facility]. Dub Mikes is accepting doctor. Pt can come after 8pm. Support paperwork signed and faxed to [facility]. Pt's nurse, Edie, notified.   Johny Shock. Ladona Ridgel, MS, NCC Disposition Counselor

## 2014-09-07 NOTE — ED Provider Notes (Signed)
CSN: [id number]     Arrival date & time 09/07/14  1207 History  This chart was scribed for Mancel Bale, MD by Chestine Spore, ED Scribe. The patient was seen in room WTR3/WLPT3 at 1:36 PM.     Chief Complaint  Patient presents with  . Suicidal      The history is provided by the patient. No language interpreter was used.    HPI Comments: Jose Hays is a 39 y.o. male with a medical hx of HTN who presents to the Emergency Department complaining of suicidal onset this morning.  Pt informed the nurse that his SI plan is to jump out in front of a car. Pt notes that he had a dramatic life change where he separated from his girlfriend in November of 2015. Pt reports that his girlfriend was pregnant and married another man. He notes that he was going through issues with the girlfriend and how the perimeters are with his child and his ex husband. Pt notes that since then he began to feel like he wanted to hurt himself and feel as if he wants to leave this world. Pt was seen in December and was at [facility] for 2 weeks for similar symptoms. Pt was set up to go to [place] and he didn't continue with the program because "life happened" and he was taking care of his newborn. 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 Pt has used cocaine and marijuana but no drinking to take "himself out of the equation." Pt notes that this morning he woke up mad at the world and tired of being everyone else's support system. Pt reports that he is here in the ED today because he does not see a light at the end of the tunnel. Pt wants to be re-admitted to [facility] to try the programs again since they helped the first time. Pt notes that he has associated symptoms of dry cough. Pt denies fever, CP, and any other symptoms. There are no other known modifying factors.    Past Medical History  Diagnosis Date  . Coronary artery disease   . Hypertension    History reviewed. No pertinent  past surgical history. No family history on file. History  Substance Use Topics  . Smoking status: Current Every Day Smoker  . Smokeless tobacco: Never Used  . Alcohol Use: No    Review of Systems  Constitutional: Negative for fever.  Respiratory: Positive for cough.   Cardiovascular: Negative for chest pain.  Psychiatric/Behavioral: Positive for suicidal ideas.  All other systems reviewed and are negative.     Allergies  Review of patient's allergies indicates no known allergies.  Home Medications   Prior to Admission medications   Medication Sig Start Date End Date Taking? Authorizing Provider  clindamycin (CLEOCIN) 300 MG capsule Take 1 capsule (300 mg total) by mouth 3 (three) times daily. Patient not taking: Reported on 09/07/2014 04/23/14   Linna Hoff, MD  diclofenac (CATAFLAM) 50 MG tablet Take 1 tablet (50 mg total) by mouth 3 (three) times daily. For dental pain Patient not taking: Reported on 09/07/2014 04/23/14   Linna Hoff, MD  polyethylene glycol [facility] / Ethelene Hal) packet Take 17 g by mouth daily. For constipation Patient not taking: Reported on 03/07/2014 01/27/14   Sanjuana Kava, NP  traZODone (DESYREL) 50 MG tablet Take 1 tablet (50 mg total) by mouth at bedtime. For sleep Patient not taking: Reported on 09/07/2014  01/27/14   Sanjuana Kava, NP   BP 140/84 mmHg  Pulse 97  Temp(Src) 98.5 F (36.9 C) (Oral)  Resp 14  SpO2 100% Physical Exam  Constitutional: He is oriented to person, place, and time. He appears well-developed and well-nourished.  HENT:  Head: Normocephalic and atraumatic.  Right Ear: External ear normal.  Left Ear: External ear normal.  Eyes: Conjunctivae and EOM are normal. Pupils are equal, round, and reactive to light.  Neck: Normal range of motion and phonation normal. Neck supple.  Cardiovascular: Normal rate, regular rhythm and normal heart sounds.   Pulmonary/Chest: Effort normal and breath sounds normal. He exhibits no bony  tenderness.  Musculoskeletal: Normal range of motion.  Neurological: He is alert and oriented to person, place, and time. No cranial nerve deficit or sensory deficit. He exhibits normal muscle tone. Coordination normal.  Skin: Skin is warm, dry and intact.  Psychiatric: He has a normal mood and affect. His behavior is normal. Judgment and thought content normal.  Nursing note and vitals reviewed.   ED Course  Procedures (including critical care time)  Filed Vitals:   09/07/14 1223  BP: 140/84  Pulse: 97  Temp: 98.5 F (36.9 C)  TempSrc: Oral  Resp: 14  SpO2: 100%     No orders of the defined types were placed in this encounter.    COORDINATION OF CARE: 1:43 PM-Discussed treatment plan which includes labwork and consult to TTS with pt at bedside and pt agreed to plan.    15:00- he is medically cleared for treatment in the psychiatric facility. Case discussed with TTS, who will evaluate and attempt to place the patient.   Labs Review Labs Reviewed  COMPREHENSIVE METABOLIC PANEL - Abnormal; Notable for the following:    Creatinine, Ser 1.31 (*)    Total Bilirubin 1.3 (*)    All other components within normal limits  ACETAMINOPHEN LEVEL - Abnormal; Notable for the following:    Acetaminophen (Tylenol), Serum <10 (*)    All other components within normal limits  URINE RAPID DRUG SCREEN, HOSP PERFORMED - Abnormal; Notable for the following:    Cocaine POSITIVE (*)    Tetrahydrocannabinol POSITIVE (*)    All other components within normal limits  ETHANOL  SALICYLATE LEVEL  CBC    Imaging Review No results found.   EKG Interpretation None      MDM   Final diagnoses:  Suicidal ideation  Polysubstance abuse    Psycho social stressors, with polysubstance abuse, and suicidal ideation. Patient is unstable and will require admission.  Nursing Notes Reviewed/ Care Coordinated Applicable Imaging Reviewed Interpretation of Laboratory Data incorporated into ED  treatment  Plan- as per TTS in consultation with oncoming provider team   I personally performed the services described in this documentation, which was scribed in my presence. The recorded information has been reviewed and is accurate.     Mancel Bale, MD 09/07/14 [phone]

## 2014-09-07 NOTE — Tx Team (Signed)
Initial Interdisciplinary Treatment Plan   PATIENT STRESSORS: Financial difficulties Marital or family conflict Substance abuse   PATIENT STRENGTHS: Active sense of humor Average or above average intelligence Communication skills   PROBLEM LIST: Problem List/Patient Goals Date to be addressed Date deferred Reason deferred Estimated date of resolution  "learning to cope with what's going on in my life"  09/07/14     "get some therapy"  09/07/14     "learn how to deal with a better outlet than what I've been using"  09/07/14     Substance abuse 09/07/14     Suicidal ideation 09/07/14                              DISCHARGE CRITERIA:  Improved stabilization in mood, thinking, and/or behavior Reduction of life-threatening or endangering symptoms to within safe limits  PRELIMINARY DISCHARGE PLAN: Outpatient therapy  PATIENT/FAMIILY INVOLVEMENT: This treatment plan has been presented to and reviewed with the patient, Jose Hays.  The patient and family have been given the opportunity to ask questions and make suggestions.  [name] 09/07/2014, 11:19 PM

## 2014-09-07 NOTE — Progress Notes (Addendum)
D: Pt is a 39 year old male admitted to [facility] voluntarily for suicidal ideation and substance abuse.  Pt reports he is here because "I tried to hurt myself and I tried to overdose on cocaine, I have high anxiety, depression."   Pt reports stressors as financial and loss of a relationship with his "daughter's mom, she's 50% of the problem."  He reports he has been using cocaine and marijuana. Pt denies withdrawal symptoms.  When asked if he is feeling suicidal, he states "not at the moment, no."  When asked if he is feeling homicidal, pt stated "not at the moment."  Pt verbally contracts for safety.  He denies hallucinations, denies pain.  He reports support system of friends.  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  He reports he was at [facility] last year "in November or December."  He reports he is unsure where he will go after he leaves [facility] and that he has been "living from place to place."  His goals are "learning to cope with what's going on in my life, get some therapy, learn how to deal with a better outlet than what I've been using."   A: Introduced self to pt.  Admission process and paperwork completed with pt.  Non-invasive body assessment completed.  Pt has dry skin on his arms, legs, and feet.  Pt provided with meal and beverage.  Belongings searched for contraband.  Items not allowed on the unit are in locker 59.  Pt oriented to unit.  Actively listened to pt and provided support and encouragement.  Blood pressure elevated when taken automatically.  Manual blood pressure taken, see flow sheet. On-call provider notified of pt's blood pressure.   R: Pt is cooperative with admission process.  He was flirtatious with MHT, but was redirectable.  He verbally contracts for safety and reports that he will notify staff of needs and concerns.  Will continue to monitor and assess for safety.

## 2014-09-07 NOTE — ED Notes (Signed)
Pt oriented to room and unit  Pt contracts for safety  15 minute checks in place.  Lunch given

## 2014-09-07 NOTE — ED Notes (Signed)
Patient appears flat with minimal communication. Denies SI, HI, AVH at present. Reports recent passive SI. Rates depression at 7/10. Denies feelings of anxiety.   Encouragement offered.   Q 15 safety checks continue.

## 2014-09-07 NOTE — ED Notes (Signed)
All Restraint charting was charted on wrong pt
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
## 2014-09-08 ENCOUNTER — Encounter (HOSPITAL_COMMUNITY): Payer: Self-pay | Admitting: Psychiatry

## 2014-09-08 DIAGNOSIS — F332 Major depressive disorder, recurrent severe without psychotic features: Principal | ICD-10-CM | POA: Diagnosis present

## 2014-09-08 DIAGNOSIS — F1414 Cocaine abuse with cocaine-induced mood disorder: Secondary | ICD-10-CM

## 2014-09-08 MED ORDER — HYDROXYZINE HCL 50 MG PO TABS
50.0000 mg | ORAL_TABLET | Freq: Three times a day (TID) | ORAL | Status: DC | PRN
  Administered 2014-09-08: 50 mg via ORAL
  Filled 2014-09-08: qty 30
  Filled 2014-09-08 (×2): qty 1

## 2014-09-08 MED ORDER — BUPROPION HCL ER (XL) 150 MG PO TB24
150.0000 mg | ORAL_TABLET | Freq: Every day | ORAL | Status: DC
  Administered 2014-09-09 – 2014-09-11 (×3): 150 mg via ORAL
  Filled 2014-09-08 (×5): qty 1

## 2014-09-08 MED ORDER — NICOTINE 21 MG/24HR TD PT24
21.0000 mg | MEDICATED_PATCH | Freq: Every day | TRANSDERMAL | Status: DC
  Administered 2014-09-08 – 2014-09-14 (×6): 21 mg via TRANSDERMAL
  Filled 2014-09-08 (×10): qty 1

## 2014-09-08 NOTE — Progress Notes (Signed)
Recreation Therapy Notes  Date: 07.22.16 Time: 9:30 am Location: [address] Hall Group Room  Group Topic: Stress Management  Goal Area(s) Addresses:  Patient will verbalize importance of using healthy stress management.  Patient will identify positive emotions associated with healthy stress management.   Intervention: Stress Management   Activity :  Guided Training and development officer.  LRT introduced and educated patients on stress management  Technique of guided imagery.  A script was used to deliver the technique to patients.  Patients were asked to follow script read a loud by LRT to engage in practicing the stress management technique.  Education:  Stress Management, Discharge Planning.   Education Outcome: Acknowledges edcuation/In group clarification offered/Needs additional education  Clinical Observations/Feedback: Patient did not attend group.   Caroll Rancher, LRT/CTRS         Caroll Rancher A 09/08/2014 3:37 PM

## 2014-09-08 NOTE — Progress Notes (Signed)
Patient ID: Jose Hays, male   DOB: 05-24-1975, 39 y.o.   MRN: [id number]   Pt currently presents with a flat affect and depressed behavior. Per self inventory, pt rates depression at a 8, hopelessness 9 and anxiety 9. Pt's daily goal is to "understand my condition" and they intend to do so by "listen." Pt reports fair sleep, poor concentration, low energy and a fair appetite. Pt pleasant in the milieu and in interactions with staff.   Pt provided with medications per providers orders. Pt's labs and vitals were monitored throughout the day. Pt supported emotionally and encouraged to express concerns and questions. Pt educated on medications.   Pt's safety ensured with 15 minute and environmental checks. Pt currently denies SI/HI and A/V hallucinations. Pt verbally agrees to seek staff if SI/HI or A/VH occurs and to consult with staff before acting on these thoughts. Will continue POC.
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
## 2014-09-08 NOTE — [facility] Suicide Risk Assessment (Signed)
[facility] INPATIENT:  Family/Significant Other Suicide Prevention Education  Suicide Prevention Education:  Patient Refusal for Family/Significant Other Suicide Prevention Education: The patient Jose Hays has refused to provide written consent for family/significant other to be provided Family/Significant Other Suicide Prevention Education during admission and/or prior to discharge.  Physician notified. SPE reviewed with patient and brochure provided. Patient encouraged to return to hospital if having suicidal thoughts, patient verbalized his/her understanding and has no further questions at this time.   Suzi Hernan, [place] 09/08/2014, 4:17 PM

## 2014-09-08 NOTE — Tx Team (Signed)
Interdisciplinary Treatment Plan Update (Adult) Date: 09/08/2014   Time Reviewed: 9:30 AM  Progress in Treatment: Attending groups: Continuing to assess, patient new to milieu Participating in groups: Continuing to assess Taking medication as prescribed: Yes Tolerating medication: Yes Family/Significant other contact made: No, assessing for appropriate contacts Patient understands diagnosis: Yes Discussing patient identified problems/goals with staff: Yes Medical problems stabilized or resolved: Yes Denies suicidal/homicidal ideation: Yes Issues/concerns per patient self-inventory: Yes Other:  New problem(s) identified: N/A  Discharge Plan or Barriers:   09/08/2014:   Reason for Continuation of Hospitalization:  Depression Anxiety Medication Stabilization   Comments: N/A  Estimated length of stay: 3-5 days   Patient is a 39 year old African American male admitted for SI and depression, reports his relationship with children's mother as a stressor. Patient lives in [place]. Patient has one previous admission to [facility] in 01/2014. Patient will benefit from crisis stabilization, medication evaluation, group therapy, and psycho education in addition to case management for discharge planning. Patient and CSW reviewed pt's identified goals and treatment plan. Pt verbalized understanding and agreed to treatment plan.     Review of initial/current patient goals per problem list:  1. Goal(s): Patient will participate in aftercare plan   Met: No   Target date: 3-5 days   As evidenced by: Patient will participate within aftercare plan AEB aftercare provider and housing plan at discharge being identified.  7/22: Goal not met: CSW assessing for appropriate referrals for pt and will have follow up secured prior to d/c.    2. Goal (s): Patient will exhibit decreased depressive symptoms and suicidal ideations.   Met: No   Target date: 3-5 days   As evidenced by:  Patient will utilize self rating of depression at 3 or below and demonstrate decreased signs of depression or be deemed stable for discharge by MD.  7/22: Goal not met: Pt presents with flat affect and depressed mood.  Pt admitted with depression rating of 10.  Pt to show decreased sign of depression and a rating of 3 or less before d/c.     3. Goal(s): Patient will demonstrate decreased signs and symptoms of anxiety.   Met: No   Target date: 3-5 days   As evidenced by: Patient will utilize self rating of anxiety at 3 or below and demonstrated decreased signs of anxiety, or be deemed stable for discharge by MD  7/22: Goal not met: Pt presents with anxious mood and affect.  Pt admitted with anxiety rating of 10.  Pt to show decreased sign of anxiety and a rating of 3 or less before d/c.   4. Goal(s): Patient will demonstrate decreased signs of withdrawal due to substance abuse   Met: Yes   Target date: 3-5 days   As evidenced by: Patient will produce a CIWA/COWS score of 0, have stable vitals signs, and no symptoms of withdrawal  7/22: Goal met: No withdrawal symptoms reported at this time per medical chart.     Attendees: Patient:    Family:    Physician: Dr. Parke Poisson; Dr. Sabra Heck 09/08/2014 9:30 AM  Nursing: Janann August, Desma Paganini, RN 09/08/2014 9:30 AM  Clinical Social Worker: Erasmo Downer Ameya Vowell,  [place] 09/08/2014 9:30 AM  Other: Peri Maris, LCSWA 09/08/2014 9:30 AM  Other: Lucinda Dell, [place] Sessions Liaison 09/08/2014 9:30 AM  Other:  09/08/2014 9:30 AM  Other: Earleen Newport, May Augustin, NP 09/08/2014 9:30 AM  Other:    Other:    Other:    Other:  Scribe for Treatment Team:  Tilden Fossa, MSW, [place]

## 2014-09-08 NOTE — Progress Notes (Signed)
Pt attended AA group this evening.  

## 2014-09-08 NOTE — Progress Notes (Signed)
NUTRITION ASSESSMENT  Pt identified as at risk on the Malnutrition Screen Tool  INTERVENTION: 1. Encourage meals and snacks   NUTRITION DIAGNOSIS: Unintentional weight loss related to sub-optimal intake as evidenced by pt report.   Goal: Pt to meet >/= 90% of their estimated nutrition needs.  Monitor:  PO intake  Assessment:  39 year old male admitted to [facility] voluntarily for suicidal ideation and substance abuse.  Height: Ht Readings from Last 1 Encounters:  09/07/14  (2.032 m)    Weight: Wt Readings from Last 1 Encounters:  09/07/14 383 lb (173.728 kg)    Weight Hx: Wt Readings from Last 10 Encounters:  09/07/14 383 lb (173.728 kg)  01/24/14 365 lb (165.563 kg)    BMI:  Body mass index is 42.07 kg/(m^2). Pt meets criteria for obesity class III based on current BMI.  Estimated Nutritional Needs: Kcal: 25 kcal/kg Protein: > 1 gram protein/kg Fluid: 1 ml/kcal  Diet Order: Diet regular Room service appropriate?: Yes; Fluid consistency:: Thin Pt is also offered choice of unit snacks mid-morning and mid-afternoon.  Pt is eating as desired.   Lab results and medications reviewed.   Kendell Bane RD, LDN, CNSC [phone] Pager [phone] After Hours Pager

## 2014-09-08 NOTE — H&P (Addendum)
Psychiatric Admission Assessment Adult  Patient Identification: Jose Hays MRN:  [id number] Date of Evaluation:  09/08/2014 Chief Complaint:  DEPRESSION SUBSTANCE ABUSE DISORDER Principal Diagnosis: <principal problem not specified> Diagnosis:   Patient Active Problem List   Diagnosis Date Noted  . Mood disorder [F39] 09/07/2014  . Major depressive disorder, single episode, moderate [F32.1]   . MDD (major depressive disorder) [F32.2] 01/24/2014   History of Present Illness:: 39 Y/o male who states that his life has been ina down spiral. States he has been doing everything he can to stay sane keep himself safe. Has a baby daughter 47 M/O baby. Her mother has "drained him, manipulated him, accused him". States that he told her if he was not  to get help he was going to be dead before his birthday. Has 3 boys in [place]. States he lived with this lady, she left, whele pregnant with his child and married someone else from another country. States she told him she made a mistake, wanted to get back with him, but she continues to be with the other guy. No alcohol cocaine every now and then  The initial assessment is as follows: Jose Hays is an 39 y.o. male who voluntarily presents to [facility] with SI due to "feeling overwhelmed with demands". He said "I don't want to physically kill myself. That's a sin". He added "I've been thinking of ways to get killed". He said he planned to run into traffic or run a red light, get stopped by a cop and then resist arrest so that he could get shot. Pt shared that a few days ago he tried to OD on cocaine, but it didn't work. Pt denies any AVH. He reports hypersomnia, feelings of worthlessness, helplessness, and hopelessness. Pt described his tumultuous relationship with his children's mother and passively identified this relationship as the source of his depression and SI. Counselor observed that pt appeared preoccupied with his children's mother AEB his continuous  referral back to her in spite of counselor numerous  Elements:  Location:  Depression. Quality:  unable to function presentation is severe buildig up to having sucidal idea. Severity:  unable to functin getting more and more depressed having suicidal ideas. Timing:  every day. Duration:  worst in the last few weeks. Context:  increased depression on no psychotropics triggered by increased conflict wiht the mother of his 37 M/O who married another man while pregnant with this his child.Cocaine abuse Associated Signs/Symptoms: Depression Symptoms:  depressed mood, anhedonia, insomnia, fatigue, feelings of worthlessness/guilt, difficulty concentrating, suicidal thoughts with specific plan, anxiety, loss of energy/fatigue, disturbed sleep, (Hypo) Manic Symptoms:  Irritable Mood, Labiality of Mood, Anxiety Symptoms:  Excessive Worry, Psychotic Symptoms:  denies  PTSD Symptoms: Had a traumatic exposure:  mental abuse Re-experiencing:  Intrusive Thoughts Nightmares Total Time spent with patient: 45 minutes  Past Medical History:  Past Medical History  Diagnosis Date  . Coronary artery disease   . Hypertension    History reviewed. No pertinent past surgical history. Family History: History reviewed. No pertinent family history.  Father uncle alcoholics Social History:  History  Alcohol Use No     History  Drug Use  . Yes  . Special: Cocaine, Marijuana    History   Social History  . Marital Status: Single    Spouse Name: N/A  . Number of Children: N/A  . Years of Education: N/A   Social History Main Topics  . Smoking status: Current Every Day Smoker -- 0.50 packs/day  .  Smokeless tobacco: Never Used  . Alcohol Use: No  . Drug Use: Yes    Special: Cocaine, Marijuana  . Sexual Activity: Not on file   Other Topics Concern  . 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 Some college, has worked with the same group  for 3 years. States he is tired of dealing with this band members. He is tired of having to be there for every one. Has 3 boys 15, 14, 6. States that he stays in touch with them. Volunteering his services at a church in [employer] [place] Additional Social History:    Pain Medications: denies Prescriptions: denies Over the Counter: denies History of alcohol / drug use?: Yes Negative Consequences of Use: Financial Withdrawal Symptoms: Other (Comment) (pt denies) Name of Substance 1: Cocaine 1 - Age of First Use: 38 1 - Amount (size/oz): 32 grams 1 - Frequency: "on and off" 1 - Duration: last 6 months 1 - Last Use / Amount: 32 grams "in about 12 hours 3 or 4 days ago" Name of Substance 2: THC 2 - Age of First Use: 19 2 - Amount (size/oz): did not specify 2 - Frequency: occasionally                 Musculoskeletal: Strength & Muscle Tone: within normal limits Gait & Station: normal Patient leans: normal  Psychiatric Specialty Exam: Physical Exam  Review of Systems  Constitutional: Positive for malaise/fatigue.  HENT: Negative.   Eyes: Positive for blurred vision.  Respiratory: Positive for cough and shortness of breath.        Pack every couple of days   Cardiovascular: Negative.   Gastrointestinal: Negative.   Genitourinary: Negative.   Musculoskeletal: Positive for back pain and joint pain.  Skin: Negative.   Neurological: Positive for dizziness and weakness.  Endo/Heme/Allergies: Negative.   Psychiatric/Behavioral: Positive for depression and suicidal ideas. The patient is nervous/anxious.     Blood pressure 124/88, pulse 68, temperature 98 F (36.7 C), temperature source Oral, resp. rate 20, height 6' 8"  (2.032 m), weight 173.728 kg (383 lb).Body mass index is 42.07 kg/(m^2).  General Appearance: Guarded  Eye Contact::  Fair  Speech:  Clear and Coherent  Volume:  fluctuates  Mood:  Anxious and Depressed  Affect:  Depressed and Restricted  Thought Process:  Coherent  and Goal Directed  Orientation:  Full (Time, Place, and Person)  Thought Content:  symptoms events worries concerns  Suicidal Thoughts:  Yes.  without intent/plan  Homicidal Thoughts:  No  Memory:  Immediate;   Fair Recent;   Fair Remote;   Fair  Judgement:  Fair  Insight:  Present  Psychomotor Activity:  Restlessness  Concentration:  Fair  Recall:  [employer] of Knowledge:Fair  Language: Fair  Akathisia:  No  Handed:  Right  AIMS (if indicated):     Assets:  Desire for Improvement  ADL's:  Intact  Cognition: WNL  Sleep:  Number of Hours: 5.5   Risk to Self: Is patient at risk for suicide?: Yes Risk to Others:   Prior Inpatient Therapy:  [facility]  Prior Outpatient Therapy:  not currently  Alcohol Screening: 1. How often do you have a drink containing alcohol?: Never 2. How many drinks containing alcohol do you have on a typical day when you are drinking?: 1 or 2 (denies alcohol use) 3. How often do you have six or more drinks on one occasion?: Never Preliminary Score:  0 4. How often during the last year have you found that you were not able to stop drinking once you had started?: Never 5. How often during the last year have you failed to do what was normally expected from you becasue of drinking?: Never 6. How often during the last year have you needed a first drink in the morning to get yourself going after a heavy drinking session?: Never 7. How often during the last year have you had a feeling of guilt of remorse after drinking?: Never 8. How often during the last year have you been unable to remember what happened the night before because you had been drinking?: Never 9. Have you or someone else been injured as a result of your drinking?: No 10. Has a relative or friend or a doctor or another health worker been concerned about your drinking or suggested you cut down?: No Alcohol Use Disorder Identification Test Final Score (AUDIT): 0 Brief Intervention: AUDIT score less than 7  or less-screening does not suggest unhealthy drinking-brief intervention not indicated  Allergies:  No Known Allergies Lab Results:  Results for orders placed or performed during the hospital encounter of 09/07/14 (from the past 48 hour(s))  Comprehensive metabolic panel     Status: Abnormal   Collection Time: 09/07/14 12:37 PM  Result Value Ref Range   Sodium 139 135 - 145 mmol/L   Potassium 3.9 3.5 - 5.1 mmol/L   Chloride 107 101 - 111 mmol/L   CO2 26 22 - 32 mmol/L   Glucose, Bld 92 65 - 99 mg/dL   BUN 13 6 - 20 mg/dL   Creatinine, Ser 1.31 (H) 0.61 - 1.24 mg/dL   Calcium 9.2 8.9 - 10.3 mg/dL   Total Protein 7.3 6.5 - 8.1 g/dL   Albumin 4.3 3.5 - 5.0 g/dL   AST 32 15 - 41 U/L   ALT 28 17 - 63 U/L   Alkaline Phosphatase 44 38 - 126 U/L   Total Bilirubin 1.3 (H) 0.3 - 1.2 mg/dL   GFR calc non Af Amer >60 >60 mL/min   GFR calc Af Amer >60 >60 mL/min    Comment: (NOTE) The eGFR has been calculated using the CKD EPI equation. This calculation has not been validated in all clinical situations. eGFR's persistently <60 mL/min signify possible Chronic Kidney Disease.    Anion gap 6 5 - 15  Ethanol (ETOH)     Status: None   Collection Time: 09/07/14 12:37 PM  Result Value Ref Range   Alcohol, Ethyl (B) <5 <5 mg/dL    Comment:        LOWEST DETECTABLE LIMIT FOR SERUM ALCOHOL IS 5 mg/dL FOR MEDICAL PURPOSES ONLY   Salicylate level     Status: None   Collection Time: 09/07/14 12:37 PM  Result Value Ref Range   Salicylate Lvl <5.1 2.8 - 30.0 mg/dL  Acetaminophen level     Status: Abnormal   Collection Time: 09/07/14 12:37 PM  Result Value Ref Range   Acetaminophen (Tylenol), Serum <10 (L) 10 - 30 ug/mL    Comment:        THERAPEUTIC CONCENTRATIONS VARY SIGNIFICANTLY. A RANGE OF 10-30 ug/mL MAY BE AN EFFECTIVE CONCENTRATION FOR MANY PATIENTS. HOWEVER, SOME ARE BEST TREATED AT CONCENTRATIONS OUTSIDE THIS RANGE. ACETAMINOPHEN CONCENTRATIONS >150 ug/mL AT 4 HOURS  AFTER INGESTION AND >50 ug/mL AT 12 HOURS AFTER INGESTION ARE OFTEN ASSOCIATED WITH TOXIC REACTIONS.   CBC     Status: None   Collection  Time: 09/07/14 12:37 PM  Result Value Ref Range   WBC 8.3 4.0 - 10.5 K/uL   RBC 5.33 4.22 - 5.81 MIL/uL   Hemoglobin 14.6 13.0 - 17.0 g/dL   HCT 44.5 39.0 - 52.0 %   MCV 83.5 78.0 - 100.0 fL   MCH 27.4 26.0 - 34.0 pg   MCHC 32.8 30.0 - 36.0 g/dL   RDW 14.0 11.5 - 15.5 %   Platelets 173 150 - 400 K/uL  Urine rapid drug screen (hosp performed) (Not at [facility])     Status: Abnormal   Collection Time: 09/07/14 12:50 PM  Result Value Ref Range   Opiates NONE DETECTED NONE DETECTED   Cocaine POSITIVE (A) NONE DETECTED   Benzodiazepines NONE DETECTED NONE DETECTED   Amphetamines NONE DETECTED NONE DETECTED   Tetrahydrocannabinol POSITIVE (A) NONE DETECTED   Barbiturates NONE DETECTED NONE DETECTED    Comment:        DRUG SCREEN FOR MEDICAL PURPOSES ONLY.  IF CONFIRMATION IS NEEDED FOR ANY PURPOSE, NOTIFY LAB WITHIN 5 DAYS.        LOWEST DETECTABLE LIMITS FOR URINE DRUG SCREEN Drug Class       Cutoff (ng/mL) Amphetamine      1000 Barbiturate      200 Benzodiazepine   202 Tricyclics       542 Opiates          300 Cocaine          300 THC              50    Current Medications: Current Facility-Administered Medications  Medication Dose Route Frequency Provider Last Rate Last Dose  . acetaminophen (TYLENOL) tablet 650 mg  650 mg Oral Q4H PRN Delfin Gant, NP      . alum & mag hydroxide-simeth (MAALOX/MYLANTA) 200-200-20 MG/5ML suspension 30 mL  30 mL Oral PRN Delfin Gant, NP      . ibuprofen (ADVIL,MOTRIN) tablet 600 mg  600 mg Oral Q8H PRN Delfin Gant, NP      . magnesium hydroxide (MILK OF MAGNESIA) suspension 30 mL  30 mL Oral Daily PRN Delfin Gant, NP      . nicotine (NICODERM CQ - dosed in mg/24 hours) patch 21 mg  21 mg Transdermal Daily PRN Delfin Gant, NP      . nicotine (NICODERM CQ - dosed in mg/24  hours) patch 21 mg  21 mg Transdermal Daily Nicholaus Bloom, MD   21 mg at 09/08/14 0843  . ondansetron (ZOFRAN) tablet 4 mg  4 mg Oral Q8H PRN Delfin Gant, NP      . zolpidem (AMBIEN) tablet 5 mg  5 mg Oral QHS PRN Delfin Gant, NP       PTA Medications: No prescriptions prior to admission    Previous Psychotropic Medications: Yes Have not been able to afford  Substance Abuse History in the last 12 months:  Yes.      Consequences of Substance Abuse: Negative  Results for orders placed or performed during the hospital encounter of 09/07/14 (from the past 72 hour(s))  Comprehensive metabolic panel     Status: Abnormal   Collection Time: 09/07/14 12:37 PM  Result Value Ref Range   Sodium 139 135 - 145 mmol/L   Potassium 3.9 3.5 - 5.1 mmol/L   Chloride 107 101 - 111 mmol/L   CO2 26 22 - 32 mmol/L   Glucose, Bld 92 65 - 99 mg/dL  BUN 13 6 - 20 mg/dL   Creatinine, Ser 1.31 (H) 0.61 - 1.24 mg/dL   Calcium 9.2 8.9 - 10.3 mg/dL   Total Protein 7.3 6.5 - 8.1 g/dL   Albumin 4.3 3.5 - 5.0 g/dL   AST 32 15 - 41 U/L   ALT 28 17 - 63 U/L   Alkaline Phosphatase 44 38 - 126 U/L   Total Bilirubin 1.3 (H) 0.3 - 1.2 mg/dL   GFR calc non Af Amer >60 >60 mL/min   GFR calc Af Amer >60 >60 mL/min    Comment: (NOTE) The eGFR has been calculated using the CKD EPI equation. This calculation has not been validated in all clinical situations. eGFR's persistently <60 mL/min signify possible Chronic Kidney Disease.    Anion gap 6 5 - 15  Ethanol (ETOH)     Status: None   Collection Time: 09/07/14 12:37 PM  Result Value Ref Range   Alcohol, Ethyl (B) <5 <5 mg/dL    Comment:        LOWEST DETECTABLE LIMIT FOR SERUM ALCOHOL IS 5 mg/dL FOR MEDICAL PURPOSES ONLY   Salicylate level     Status: None   Collection Time: 09/07/14 12:37 PM  Result Value Ref Range   Salicylate Lvl <7.3 2.8 - 30.0 mg/dL  Acetaminophen level     Status: Abnormal   Collection Time: 09/07/14 12:37 PM  Result  Value Ref Range   Acetaminophen (Tylenol), Serum <10 (L) 10 - 30 ug/mL    Comment:        THERAPEUTIC CONCENTRATIONS VARY SIGNIFICANTLY. A RANGE OF 10-30 ug/mL MAY BE AN EFFECTIVE CONCENTRATION FOR MANY PATIENTS. HOWEVER, SOME ARE BEST TREATED AT CONCENTRATIONS OUTSIDE THIS RANGE. ACETAMINOPHEN CONCENTRATIONS >150 ug/mL AT 4 HOURS AFTER INGESTION AND >50 ug/mL AT 12 HOURS AFTER INGESTION ARE OFTEN ASSOCIATED WITH TOXIC REACTIONS.   CBC     Status: None   Collection Time: 09/07/14 12:37 PM  Result Value Ref Range   WBC 8.3 4.0 - 10.5 K/uL   RBC 5.33 4.22 - 5.81 MIL/uL   Hemoglobin 14.6 13.0 - 17.0 g/dL   HCT 44.5 39.0 - 52.0 %   MCV 83.5 78.0 - 100.0 fL   MCH 27.4 26.0 - 34.0 pg   MCHC 32.8 30.0 - 36.0 g/dL   RDW 14.0 11.5 - 15.5 %   Platelets 173 150 - 400 K/uL  Urine rapid drug screen (hosp performed) (Not at [facility])     Status: Abnormal   Collection Time: 09/07/14 12:50 PM  Result Value Ref Range   Opiates NONE DETECTED NONE DETECTED   Cocaine POSITIVE (A) NONE DETECTED   Benzodiazepines NONE DETECTED NONE DETECTED   Amphetamines NONE DETECTED NONE DETECTED   Tetrahydrocannabinol POSITIVE (A) NONE DETECTED   Barbiturates NONE DETECTED NONE DETECTED    Comment:        DRUG SCREEN FOR MEDICAL PURPOSES ONLY.  IF CONFIRMATION IS NEEDED FOR ANY PURPOSE, NOTIFY LAB WITHIN 5 DAYS.        LOWEST DETECTABLE LIMITS FOR URINE DRUG SCREEN Drug Class       Cutoff (ng/mL) Amphetamine      1000 Barbiturate      200 Benzodiazepine   567 Tricyclics       014 Opiates          300 Cocaine          300 THC              50  Observation Level/Precautions:  15 minute checks  Laboratory:  As per the ED  Psychotherapy: Individual/group   Medications:  Will start Wellbutrin XL 150 mg in AM  Consultations:    Discharge Concerns:    Estimated LOS: 3-5 days  Other:     Psychological Evaluations: No   Treatment Plan Summary: Daily contact with patient to assess and evaluate  symptoms and progress in treatment and Medication management Supportive approach/coping skills Major depression; start Wellbutrin XL 300 mg in AM Cocaine abuse; work a relapse prevention plan Work with Loss adjuster, chartered Decision Making:  Review of Psycho-Social Stressors (1), Review or order clinical lab tests (1) and Review of New Medication or Change in Dosage (2)  I certify that inpatient services furnished can reasonably be expected to improve the patient's condition.   Jose Hays A 7/22/201610:31 AM

## 2014-09-08 NOTE — Progress Notes (Signed)
D.  Pt anxious on approach, states he is unsure the cause of his anxiety.  Pt states that this became progressive this evening.  Requested NP to be called and wished to see what she suggested.  Positive for evening AA group, interacting appropriately with peers on the unit.  Denies SI/HI/hallucinations at this time.  A.  Support and encouragement offered, NP notified of anxiety and order received.  R.  Pt remains safe on unit, will continue to monitor.

## 2014-09-08 NOTE — [facility] Group Notes (Signed)
[facility] LCSW Group Therapy 09/08/2014  1:15 PM   Type of Therapy: Group Therapy  Participation Level: Did Not Attend. Patient invited to participate but declined.   Samuella Bruin, MSW, [employer] Clinical Social Worker [facility] [phone]

## 2014-09-08 NOTE — [facility] Suicide Risk Assessment (Signed)
[facility] Admission Suicide Risk Assessment   Nursing information obtained from:  Patient Demographic factors:  Male, Low socioeconomic status, Living alone Current Mental Status:  Suicidal ideation indicated by patient Loss Factors:  Loss of significant relationship, Financial problems / change in socioeconomic status Historical Factors:  Impulsivity Risk Reduction Factors:  Positive coping skills or problem solving skills Total Time spent with patient: 45 minutes Principal Problem: Severe recurrent major depression without psychotic features Diagnosis:   Patient Active Problem List   Diagnosis Date Noted  . Severe recurrent major depression without psychotic features [F33.2] 09/08/2014  . Cocaine abuse with cocaine-induced mood disorder [F14.14] 09/08/2014  . Mood disorder [F39] 09/07/2014     Continued Clinical Symptoms:  Alcohol Use Disorder Identification Test Final Score (AUDIT): 0 The "Alcohol Use Disorders Identification Test", Guidelines for Use in Primary Care, Second Edition.  World Science writer [facility]). Score between 0-7:  no or low risk or alcohol related problems. Score between 8-15:  moderate risk of alcohol related problems. Score between 16-19:  high risk of alcohol related problems. Score 20 or above:  warrants further diagnostic evaluation for alcohol dependence and treatment.   CLINICAL FACTORS:   Depression:   Comorbid alcohol abuse/dependence Alcohol/Substance Abuse/Dependencies  Psychiatric Specialty Exam: Physical Exam  ROS  Blood pressure 124/88, pulse 68, temperature 98 F (36.7 C), temperature source Oral, resp. rate 20, height  (2.032 m), weight 173.728 kg (383 lb).Body mass index is 42.07 kg/(m^2).   COGNITIVE FEATURES THAT CONTRIBUTE TO RISK:  Closed-mindedness, Polarized thinking and Thought constriction (tunnel vision)    SUICIDE RISK:   Moderate:  Frequent suicidal ideation with limited intensity, and duration, some specificity in terms  of plans, no associated intent, good self-control, limited dysphoria/symptomatology, some risk factors present, and identifiable protective factors, including available and accessible social support.  PLAN OF CARE: Supportive approach/coping skills                               Depression; will start Wellbutrin XL 300 mg in AM                                Cocaine abuse; work a relapse prevention plan                                 Work with CBT/mindfulness  Medical Decision Making:  Review of Psycho-Social Stressors (1), Review or order clinical lab tests (1) and Review of New Medication or Change in Dosage (2)  I certify that inpatient services furnished can reasonably be expected to improve the patient's condition.   Hayze Gazda A 09/08/2014, 6:01 PM

## 2014-09-08 NOTE — [facility] Group Notes (Signed)
   [facility] LCSW Aftercare Discharge Planning Group Note  09/08/2014  8:45 AM   Participation Quality: Alert, Appropriate and Oriented  Mood/Affect: Depressed and Flat  Depression Rating: Patient unable to rate  Anxiety Rating: Patient unable to rate  Thoughts of Suicide: Pt denies SI/HI  Will you contract for safety? Yes  Current AVH: Pt denies  Plan for Discharge/Comments: Pt attended discharge planning group and actively participated in group. CSW provided pt with today's workbook. Patient reports that he is unsure if he has a safe place to discharge to and would like to be set up with outpatient services.  Transportation Means: Pt reports access to transportation  Supports: No supports mentioned at this time  Samuella Bruin, MSW, [employer] Clinical Social Worker [employer] [phone]

## 2014-09-09 ENCOUNTER — Encounter (HOSPITAL_COMMUNITY): Payer: Self-pay | Admitting: Registered Nurse

## 2014-09-09 DIAGNOSIS — R45851 Suicidal ideations: Secondary | ICD-10-CM

## 2014-09-09 MED ORDER — TRAZODONE HCL 50 MG PO TABS
50.0000 mg | ORAL_TABLET | Freq: Every evening | ORAL | Status: DC | PRN
  Administered 2014-09-09 – 2014-09-10 (×2): 50 mg via ORAL
  Filled 2014-09-09: qty 1

## 2014-09-09 NOTE — Progress Notes (Signed)
NS[facility] shift assessment. 7a-7p.   D: Affect blunted, mood depressed, behavior appropriate. Attends groups and participates. Did not fill out a Self Inventory. Cooperative with staff and is getting along well with peers.   A: Observed pt interacting in group and in the milieu: Support and encouragement offered. Safety maintained with observations every 15 minutes.   R:   Contracts for safety and continues to follow the treatment plan, working on learning new coping skills.

## 2014-09-09 NOTE — Progress Notes (Signed)
[facility] MD Progress Note  09/09/2014 2:03 PM Jose Hays  MRN:  [id number]   Subjective:  Patient states that he is here because he was having thoughts of hurting himself. Today he states that his anxiety is 5-6/10.  "I slept a  better.  Group is helping me learn better mechanisms to help myself."  "Early this morning I was still having some negative thoughts."  Objective:  Patient seen and chart reviewed.  Patient attending and participating in group sessions.  Patient is tolerating medications without adverse reaction; first dose of Wellbutrin was given this morning.  Patient continues to have suicidal thoughts on and off.     Principal Problem: Severe recurrent major depression without psychotic features Diagnosis:   Patient Active Problem List   Diagnosis Date Noted  . Severe recurrent major depression without psychotic features [F33.2] 09/08/2014  . Cocaine abuse with cocaine-induced mood disorder [F14.14] 09/08/2014  . Mood disorder [F39] 09/07/2014   Total Time spent with patient: 45 minutes   Past Medical History:  Past Medical History  Diagnosis Date  . Coronary artery disease   . Hypertension    History reviewed. No pertinent past surgical history. Family History: History reviewed. No pertinent family history. Social History:  History  Alcohol Use No     History  Drug Use  . Yes  . Special: Cocaine, Marijuana    History   Social History  . Marital Status: Single    Spouse Name: N/A  . Number of Children: N/A  . Years of Education: N/A   Social History Main Topics  . Smoking status: Current Every Day Smoker -- 0.50 packs/day  . Smokeless tobacco: Never Used  . Alcohol Use: No  . Drug Use: Yes    Special: Cocaine, Marijuana  . Sexual Activity: Not on file   Other Topics Concern  . None   Social History Narrative   Additional History:    Sleep: Fair  Appetite:  Good   Assessment:   Musculoskeletal: Strength & Muscle Tone: within normal  limits Gait & Station: normal Patient leans: N/A   Psychiatric Specialty Exam: Physical Exam  Constitutional: He is oriented to person, place, and time.  Neck: Normal range of motion.  Respiratory: Effort normal.  Musculoskeletal: Normal range of motion.  Neurological: He is alert and oriented to person, place, and time.    Review of Systems  Constitutional: Positive for malaise/fatigue.  Respiratory: Positive for cough and shortness of breath.        Smoker 1 pk last couple of day  Musculoskeletal: Positive for back pain and joint pain.  Psychiatric/Behavioral: Positive for depression and suicidal ideas. The patient is nervous/anxious.   All other systems reviewed and are negative.   Blood pressure 130/89, pulse 79, temperature 97.8 F (36.6 C), temperature source Oral, resp. rate 16, height 6\' 8"  (2.032 m), weight 173.728 kg (383 lb).Body mass index is 42.07 kg/(m^2).  General Appearance: Casual  Eye Contact::  Fair  Speech:  Clear and Coherent and Normal Rate  Volume:  Normal  Mood:  Anxious, Depressed and Hopeless  Affect:  Depressed and Flat  Thought Process:  Circumstantial and Goal Directed  Orientation:  Full (Time, Place, and Person)  Thought Content:  Denies hallucinations, delusions, and paranoia  Suicidal Thoughts:  Yes.  with intent/plan  Homicidal Thoughts:  No  Memory:  Immediate;   Fair Recent;   Fair Remote;   Fair  Judgement:  Fair  Insight:  Fair  Psychomotor Activity:  Normal  Concentration:  Fair  Recall:  [employer] of Knowledge:Good  Language: Good  Akathisia:  No  Handed:  Right  AIMS (if indicated):     Assets:  Communication Skills Desire for Improvement  ADL's:  Intact  Cognition: WNL  Sleep:  Number of Hours: 5.75     Current Medications: Current Facility-Administered Medications  Medication Dose Route Frequency Provider Last Rate Last Dose  . acetaminophen (TYLENOL) tablet 650 mg  650 mg Oral Q4H PRN Earney Navy, NP      .  alum & mag hydroxide-simeth (MAALOX/MYLANTA) 200-200-20 MG/5ML suspension 30 mL  30 mL Oral PRN Earney Navy, NP      . buPROPion (WELLBUTRIN XL) 24 hr tablet 150 mg  150 mg Oral Daily Rachael Fee, MD   150 mg at 09/09/14 1610  . hydrOXYzine (ATARAX/VISTARIL) tablet 50 mg  50 mg Oral TID PRN Worthy Flank, NP   50 mg at 09/08/14 2129  . ibuprofen (ADVIL,MOTRIN) tablet 600 mg  600 mg Oral Q8H PRN Earney Navy, NP      . magnesium hydroxide (MILK OF MAGNESIA) suspension 30 mL  30 mL Oral Daily PRN Earney Navy, NP      . nicotine (NICODERM CQ - dosed in mg/24 hours) patch 21 mg  21 mg Transdermal Daily PRN Earney Navy, NP      . nicotine (NICODERM CQ - dosed in mg/24 hours) patch 21 mg  21 mg Transdermal Daily Rachael Fee, MD   21 mg at 09/09/14 9604  . ondansetron (ZOFRAN) tablet 4 mg  4 mg Oral Q8H PRN Earney Navy, NP      . zolpidem (AMBIEN) tablet 5 mg  5 mg Oral QHS PRN Earney Navy, NP        Lab Results: No results found for this or any previous visit (from the past 48 hour(s)).  Physical Findings: AIMS: Facial and Oral Movements Muscles of Facial Expression: None, normal Lips and Perioral Area: None, normal Jaw: None, normal Tongue: None, normal,Extremity Movements Upper (arms, wrists, hands, fingers): None, normal Lower (legs, knees, ankles, toes): None, normal, Trunk Movements Neck, shoulders, hips: None, normal, Overall Severity Severity of abnormal movements (highest score from questions above): None, normal Incapacitation due to abnormal movements: None, normal Patient's awareness of abnormal movements (rate only patient's report): No Awareness, Dental Status Current problems with teeth and/or dentures?: No Does patient usually wear dentures?: No  CIWA:  CIWA-Ar Total: 6 COWS:  COWS Total Score: 1  Treatment Plan Summary: Daily contact with patient to assess and evaluate symptoms and progress in treatment and Medication  management  Daily contact with patient to assess and evaluate symptoms and progress in treatment and Medication management Supportive approach/coping skills Major depression;continue Wellbutrin XL 300 mg in AM Cocaine abuse; work a relapse prevention plan Work with CBT/mindfulness/stress management   Will continue current treatment plan with no changes at this time.  Medical Decision Making:  Review of Psycho-Social Stressors (1), Review or order clinical lab tests (1), Review and summation of old records (2), Review of Last Therapy Session (1), Independent Review of image, tracing or specimen (2) and Review of Medication Regimen & Side Effects (2)   Rankin, Shuvon, FNP-BC 09/09/2014, 2:03 PM Patient seen face-to-face for psychiatric evaluation, chart reviewed and case discussed with the physician extender and developed treatment plan. Reviewed the information documented and agree with the treatment plan. Thedore Mins, MD

## 2014-09-09 NOTE — [facility] Group Notes (Signed)
[facility] Group Notes:  (Clinical Social Work)  09/09/2014     10-11AM  Summary of Progress/Problems:   The main focus of today's process group was to learn how to use a decisional balance exercise to move forward in the Stages of Change, which were described and discussed.  Motivational Interviewing and a worksheet were utilized to help patients explore in depth the perceived benefits and costs of unhealthy coping techniques, as well as the  benefits and costs of replacing that with a healthy coping skills.   The patient listened during group after being late, did not really contribute much, then appeared to doze off.  Type of Therapy:  Group Therapy - Process   Participation Level:  Minimal  Participation Quality:  Attentive and Drowsy  Affect:  Blunted  Cognitive:  Appropriate  Insight:  Developing/Improving  Engagement in Therapy:  Developing/Improving  Modes of Intervention:  Education, Motivational Interviewing  Grizzell Mantle, LCSW 09/09/2014, 12:41 PM

## 2014-09-10 NOTE — Progress Notes (Signed)
D.  Pt pleasant on approach, denies complaints at this time.  Interacting appropriately with peers on unit, but Pt was upset by peer that was agitated and slamming doors.  Pt did spend time in dayroom before bed and stated that Trazodone worked well for him last night.  Denies SI/HI/hallucinations at this time.  A.  Support and encouragement offered  R.  Pt remains safe on unit, will continue to monitor.

## 2014-09-10 NOTE — Progress Notes (Signed)
D.  Pt pleasant on approach, states he has felt all day as if he were in a fog.  Pt did not wish to take Vistaril any more due to this as he feels that this might be what caused him to feel that way.  Pt also did not wish to take Ambien for sleep as he has never tried it before and is afraid of the side effects.  Pt requested Trazodone as this has worked for him in the past.  Pt denies SI/HI/hallucinatons at this time.  Interacting appropriately within milieu.  A.  Support and encouragement offered, NP notified of Trazodone request and orders received.  R.  Pt pleased with new order, remains safe on unit. Will continue to monitor.

## 2014-09-10 NOTE — [facility] Group Notes (Signed)
[facility] Group Notes:  (Nursing/MHT/Case Management/Adjunct)  Date:  09/10/2014  Time:  2:15 PM  Type of Therapy:  Nurse Education  Participation Level:  Did Not Attend   Summary of Progress/Problems:  Aamina Skiff N Aislee Landgren 09/10/2014, 5:09 PM 

## 2014-09-10 NOTE — Progress Notes (Signed)
[facility] MD Progress Note  09/10/2014 2:49 PM Jose Hays  MRN:  [id number]   Subjective:  Patient states that he continues to have suicidal thoughts. "I had some suicidal thoughts earlier this morning; but they were lesser today than they were yesterday.  I felt better after I went to group.  I participated quite a bit today and felt a lot better afterwards."   Objective:  Patient seen and chart reviewed.  Patient attending and participating in group sessions.  Patient is tolerating medications but states that he is feeling a little drowsy and feels that it may be coming from the medication.  Continues to have suicidal thoughts on and off but they are lessening and patient states that he is feeling better.     Principal Problem: Severe recurrent major depression without psychotic features Diagnosis:   Patient Active Problem List   Diagnosis Date Noted  . Severe recurrent major depression without psychotic features [F33.2] 09/08/2014  . Cocaine abuse with cocaine-induced mood disorder [F14.14] 09/08/2014  . Mood disorder [F39] 09/07/2014   Total Time spent with patient: 20 minutes   Past Medical History:  Past Medical History  Diagnosis Date  . Coronary artery disease   . Hypertension    History reviewed. No pertinent past surgical history. Family History: History reviewed. No pertinent family history. Social History:  History  Alcohol Use No     History  Drug Use  . Yes  . Special: Cocaine, Marijuana    History   Social History  . Marital Status: Single    Spouse Name: N/A  . Number of Children: N/A  . Years of Education: N/A   Social History Main Topics  . Smoking status: Current Every Day Smoker -- 0.50 packs/day  . Smokeless tobacco: Never Used  . Alcohol Use: No  . Drug Use: Yes    Special: Cocaine, Marijuana  . Sexual Activity: Not on file   Other Topics Concern  . None   Social History Narrative   Additional History:    Sleep: Fair,  improving  Appetite:  Good   Assessment:   Musculoskeletal: Strength & Muscle Tone: within normal limits Gait & Station: normal Patient leans: N/A   Psychiatric Specialty Exam: Physical Exam  Constitutional: He is oriented to person, place, and time.  Neck: Normal range of motion.  Respiratory: Effort normal.  Musculoskeletal: Normal range of motion.  Neurological: He is alert and oriented to person, place, and time.    Review of Systems  Musculoskeletal: Positive for back pain and joint pain.  Psychiatric/Behavioral: Positive for depression and suicidal ideas. The patient is nervous/anxious.   All other systems reviewed and are negative.   Blood pressure 114/95, pulse 71, temperature 98.2 F (36.8 C), temperature source Oral, resp. rate 17, height 6\' 8"  (2.032 m), weight 173.728 kg (383 lb).Body mass index is 42.07 kg/(m^2).  General Appearance: Casual  Eye Contact::  Fair  Speech:  Clear and Coherent and Normal Rate  Volume:  Normal  Mood:  Anxious and Depressed  Affect:  Depressed  Thought Process:  Circumstantial and Goal Directed  Orientation:  Full (Time, Place, and Person)  Thought Content:  Denies hallucinations, delusions, and paranoia  Suicidal Thoughts:  Yes.  with intent/plan  Homicidal Thoughts:  No  Memory:  Immediate;   Good Recent;   Good Remote;   Good  Judgement:  Fair  Insight:  Present  Psychomotor Activity:  Normal  Concentration:  Fair  Recall:  Good  Fund of  Knowledge:Good  Language: Good  Akathisia:  No  Handed:  Right  AIMS (if indicated):     Assets:  Communication Skills Desire for Improvement  ADL's:  Intact  Cognition: WNL  Sleep:  Number of Hours: 5.75     Current Medications: Current Facility-Administered Medications  Medication Dose Route Frequency Provider Last Rate Last Dose  . acetaminophen (TYLENOL) tablet 650 mg  650 mg Oral Q4H PRN Earney Navy, NP      . alum & mag hydroxide-simeth (MAALOX/MYLANTA) 200-200-20  MG/5ML suspension 30 mL  30 mL Oral PRN Earney Navy, NP      . buPROPion (WELLBUTRIN XL) 24 hr tablet 150 mg  150 mg Oral Daily Rachael Fee, MD   150 mg at 09/10/14 1610  . hydrOXYzine (ATARAX/VISTARIL) tablet 50 mg  50 mg Oral TID PRN Worthy Flank, NP   50 mg at 09/08/14 2129  . ibuprofen (ADVIL,MOTRIN) tablet 600 mg  600 mg Oral Q8H PRN Earney Navy, NP      . magnesium hydroxide (MILK OF MAGNESIA) suspension 30 mL  30 mL Oral Daily PRN Earney Navy, NP      . nicotine (NICODERM CQ - dosed in mg/24 hours) patch 21 mg  21 mg Transdermal Daily PRN Earney Navy, NP      . nicotine (NICODERM CQ - dosed in mg/24 hours) patch 21 mg  21 mg Transdermal Daily Rachael Fee, MD   21 mg at 09/10/14 0824  . ondansetron (ZOFRAN) tablet 4 mg  4 mg Oral Q8H PRN Earney Navy, NP      . traZODone (DESYREL) tablet 50 mg  50 mg Oral QHS PRN Kristeen Mans, NP   50 mg at 09/09/14 2231    Lab Results: No results found for this or any previous visit (from the past 48 hour(s)).  Physical Findings: AIMS: Facial and Oral Movements Muscles of Facial Expression: None, normal Lips and Perioral Area: None, normal Jaw: None, normal Tongue: None, normal,Extremity Movements Upper (arms, wrists, hands, fingers): None, normal Lower (legs, knees, ankles, toes): None, normal, Trunk Movements Neck, shoulders, hips: None, normal, Overall Severity Severity of abnormal movements (highest score from questions above): None, normal Incapacitation due to abnormal movements: None, normal Patient's awareness of abnormal movements (rate only patient's report): No Awareness, Dental Status Current problems with teeth and/or dentures?: No Does patient usually wear dentures?: No  CIWA:  CIWA-Ar Total: 1 COWS:  COWS Total Score: 1  Treatment Plan Summary: Daily contact with patient to assess and evaluate symptoms and progress in treatment and Medication management  Daily contact with patient to  assess and evaluate symptoms and progress in treatment and Medication management Supportive approach/coping skills Major depression;continue Wellbutrin XL 300 mg in AM Cocaine abuse; work a relapse prevention plan Work with CBT/mindfulness/stress management   Patient continues to improve; Will continue with current treatment plan at this time with no changes.  Medical Decision Making:  Review of Psycho-Social Stressors (1), Review of Last Therapy Session (1) and Review of Medication Regimen & Side Effects (2)   Rankin, Shuvon, FNP-BC 09/10/2014, 2:49 PM Patient seen face-to-face for psychiatric evaluation, chart reviewed and case discussed with the physician extender and developed treatment plan. Reviewed the information documented and agree with the treatment plan. Thedore Mins, MD

## 2014-09-10 NOTE — [facility] Group Notes (Signed)
[facility] Group Notes:  (Clinical Social Work)  09/10/2014  10:00-11:00AM  Summary of Progress/Problems:   The main focus of today's process group was to   1)  discuss the importance of adding supports  2)  define health supports versus unhealthy supports  3)  identify the patient's current unhealthy supports and plan how to handle them  4)  Identify the patient's current healthy supports and plan what to add.  An emphasis was placed on using counselor, doctor, therapy groups, 12-step groups, and problem-specific support groups to expand supports.    The patient expressed full comprehension of the concepts presented, but also expressed considerable frustration and even irritation with constantly being asked about his discharge plan.  He stated he is here to get better and it is increasing pressure on him to think about what he will do at discharge.  He was open to hearing reasons for this, but remained irritable.  He talked at length about his stressors and the reasons for his recent suicide attempt.  CSW kept pointing him back to making a plan to see a therapist to help process these types of things, as well as build skills on how to say no to people and take better care of himself, and he never said he was willing to consider that, but did agree to think about it.  Type of Therapy:  Process Group with Motivational Interviewing  Participation Level:  Active  Participation Quality:  Attentive, Monopolizing and Resistant  Affect:  Blunted and Irritable  Cognitive:  Alert  Insight:  Defensive and Monopolizing  Engagement in Therapy:  Engaged  Modes of Intervention:   Education, Support and Processing, Activity  Disch Mantle, LCSW 09/10/2014

## 2014-09-10 NOTE — Progress Notes (Signed)
D: Pt guarded on approach during morning medication regimen. "I just woke up, I cant answer questions right now." Patient reports passive SI. Denies HI. Denies AVH. Per patient self inventory form he reports he slept fair last night with the use of sleep medication. He reports a fair appetite, low energy level, poor concentration. He rates depression 7/10, hopelessness 7/10, anxiety 6/10 - all on 1-10 scale, 10 being the worse. He denies any physical pain. He reports his goal for the day is "to learn more about my condition." and that he will meet his goal by "attending group." Pt did not attend nursing group on the unit. As the shift progressed pt presents with more bright affect and observed socializing with peers in the day room.  A:Special checks q 15 mins in place for safety. Medication administered per MD order(See eMAR). Encouragement and support provided.  R:Safety maintained. Patient able to verbally contract for safety. Will continue to monitor.

## 2014-09-11 MED ORDER — QUETIAPINE FUMARATE 100 MG PO TABS
100.0000 mg | ORAL_TABLET | Freq: Every day | ORAL | Status: DC
  Administered 2014-09-11 – 2014-09-12 (×2): 100 mg via ORAL
  Filled 2014-09-11: qty 14
  Filled 2014-09-11 (×6): qty 1

## 2014-09-11 MED ORDER — BUPROPION HCL ER (XL) 300 MG PO TB24
300.0000 mg | ORAL_TABLET | Freq: Every day | ORAL | Status: DC
  Administered 2014-09-12 – 2014-09-15 (×4): 300 mg via ORAL
  Filled 2014-09-11 (×5): qty 1
  Filled 2014-09-11: qty 3
  Filled 2014-09-11 (×2): qty 1

## 2014-09-11 NOTE — [facility] Group Notes (Signed)
[facility] LCSW Group Therapy 09/11/2014  1:15 PM   Type of Therapy: Group Therapy  Participation Level: Did Not Attend. Patient invited to participate but declined.   Dorothey Oetken, MSW, LCSWA Clinical Social Worker [facility] [phone]   

## 2014-09-11 NOTE — Progress Notes (Signed)
Patient ID: Jose Hays, male   DOB: 22-Apr-1975, 39 y.o.   MRN: [id number]  Pt currently presents with a flat affect and depressed behavior. Pt reports feelings of anger and ambivalence today towards his situation with his daughter's mother today. Pt reports "I just want to be free, free to do what I want." Pt reports that he feels he does much for others. 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   Pt provided with medications per providers orders. Pt's labs and vitals were monitored throughout the day. Pt supported emotionally and encouraged to express concerns and questions. Pt educated on medications. Pt given multiple 1:1s today when pt approached Clinical research associate.   Pt's safety ensured with 15 minute and environmental checks. Pt currently denies SI/HI and A/V hallucinations. Pt verbally agrees to seek staff if SI/HI or A/VH occurs and to consult with staff before acting on these thoughts. Will continue POC.

## 2014-09-11 NOTE — Progress Notes (Signed)
Patient did attend the evening speaker AA meeting.  

## 2014-09-11 NOTE — [facility] Group Notes (Signed)
   [facility] LCSW Aftercare Discharge Planning Group Note  09/11/2014  8:45 AM   Participation Quality: Alert, Appropriate and Oriented  Mood/Affect: Depressed and Flat  Depression Rating: 7  Anxiety Rating: 7-8  Thoughts of Suicide: Pt denies SI/HI  Will you contract for safety? Yes  Current AVH: Pt denies  Plan for Discharge/Comments: Pt attended discharge planning group and actively participated in group. CSW provided pt with today's workbook. Patient reports that he is unsure of where he will stay at discharge and is considering relocating to [facility]. Or going to the [facility]. Patient reports having nightmares since starting his medications during hospitalization.   Transportation Means: CSW assessing for transportation needs  Supports: No supports mentioned at this time  Samuella Bruin, MSW, [employer] Clinical Social Worker [employer] [phone]

## 2014-09-11 NOTE — Progress Notes (Signed)
[facility] MD Progress Note  09/11/2014 5:18 PM Jose Hays  MRN:  [id number] Subjective:  Eliberto continues to have a hard time. His ex-GF came and brought his daughter with her. He states she continues to give him mixed messages. He states he needs to go away for a while and get himself together otherwise not sure what is going to happen. He states he is having violent dreams. States he has to make and extra effort not go off on this guy that she married. He is not sleeping well at nights. Still worrying ruminating Principal Problem: Severe recurrent major depression without psychotic features Diagnosis:   Patient Active Problem List   Diagnosis Date Noted  . Severe recurrent major depression without psychotic features [F33.2] 09/08/2014  . Cocaine abuse with cocaine-induced mood disorder [F14.14] 09/08/2014  . Mood disorder [F39] 09/07/2014   Total Time spent with patient: 30 minutes   Past Medical History:  Past Medical History  Diagnosis Date  . Coronary artery disease   . Hypertension    History reviewed. No pertinent past surgical history. Family History: History reviewed. No pertinent family history. Social History:  History  Alcohol Use No     History  Drug Use  . Yes  . Special: Cocaine, Marijuana    History   Social History  . Marital Status: Single    Spouse Name: N/A  . Number of Children: N/A  . Years of Education: N/A   Social History Main Topics  . Smoking status: Current Every Day Smoker -- 0.50 packs/day  . Smokeless tobacco: Never Used  . Alcohol Use: No  . Drug Use: Yes    Special: Cocaine, Marijuana  . Sexual Activity: Not on file   Other Topics Concern  . None   Social History Narrative   Additional History:    Sleep: Poor  Appetite:  Poor   Assessment:   Musculoskeletal: Strength & Muscle Tone: within normal limits Gait & Station: normal Patient leans: normal   Psychiatric Specialty Exam: Physical Exam  Review of Systems   Constitutional: Negative.   HENT: Negative.   Eyes: Negative.   Respiratory: Negative.   Cardiovascular: Negative.   Gastrointestinal: Negative.   Genitourinary: Negative.   Musculoskeletal: Negative.   Skin: Negative.   Neurological: Negative.   Endo/Heme/Allergies: Negative.   Psychiatric/Behavioral: Positive for depression. The patient is nervous/anxious.     Blood pressure 127/73, pulse 71, temperature 97.7 F (36.5 C), temperature source Oral, resp. rate 20, height 6\' 8"  (2.032 m), weight 173.728 kg (383 lb).Body mass index is 42.07 kg/(m^2).  General Appearance: Fairly Groomed  Patent attorney::  Fair  Speech:  Clear and Coherent  Volume:  Decreased  Mood:  Anxious, Depressed and Dysphoric  Affect:  anxious worried  Thought Process:  Coherent and Goal Directed  Orientation:  Full (Time, Place, and Person)  Thought Content:  symptoms events worries concerns  Suicidal Thoughts:  No  Homicidal Thoughts:  No  Memory:  Immediate;   Fair Recent;   Fair Remote;   Fair  Judgement:  Fair  Insight:  Present and Shallow  Psychomotor Activity:  Restlessness  Concentration:  Fair  Recall:  [employer] of Knowledge:Fair  Language: Fair  Akathisia:  No  Handed:  Right  AIMS (if indicated):     Assets:  Desire for Improvement  ADL's:  Intact  Cognition: WNL  Sleep:  Number of Hours: 5.75     Current Medications: Current Facility-Administered Medications  Medication Dose Route Frequency  Provider Last Rate Last Dose  . acetaminophen (TYLENOL) tablet 650 mg  650 mg Oral Q4H PRN Earney Navy, NP      . alum & mag hydroxide-simeth (MAALOX/MYLANTA) 200-200-20 MG/5ML suspension 30 mL  30 mL Oral PRN Earney Navy, NP      . buPROPion (WELLBUTRIN XL) 24 hr tablet 150 mg  150 mg Oral Daily Rachael Fee, MD   150 mg at 09/11/14 0830  . hydrOXYzine (ATARAX/VISTARIL) tablet 50 mg  50 mg Oral TID PRN Worthy Flank, NP   50 mg at 09/08/14 2129  . ibuprofen (ADVIL,MOTRIN)  tablet 600 mg  600 mg Oral Q8H PRN Earney Navy, NP      . magnesium hydroxide (MILK OF MAGNESIA) suspension 30 mL  30 mL Oral Daily PRN Earney Navy, NP      . nicotine (NICODERM CQ - dosed in mg/24 hours) patch 21 mg  21 mg Transdermal Daily PRN Earney Navy, NP   21 mg at 09/11/14 0830  . nicotine (NICODERM CQ - dosed in mg/24 hours) patch 21 mg  21 mg Transdermal Daily Rachael Fee, MD   21 mg at 09/10/14 0824  . ondansetron (ZOFRAN) tablet 4 mg  4 mg Oral Q8H PRN Earney Navy, NP      . QUEtiapine (SEROQUEL) tablet 100 mg  100 mg Oral QHS Rachael Fee, MD        Lab Results: No results found for this or any previous visit (from the past 48 hour(s)).  Physical Findings: AIMS: Facial and Oral Movements Muscles of Facial Expression: None, normal Lips and Perioral Area: None, normal Jaw: None, normal Tongue: None, normal,Extremity Movements Upper (arms, wrists, hands, fingers): None, normal Lower (legs, knees, ankles, toes): None, normal, Trunk Movements Neck, shoulders, hips: None, normal, Overall Severity Severity of abnormal movements (highest score from questions above): None, normal Incapacitation due to abnormal movements: None, normal Patient's awareness of abnormal movements (rate only patient's report): No Awareness, Dental Status Current problems with teeth and/or dentures?: No Does patient usually wear dentures?: No  CIWA:  CIWA-Ar Total: 1 COWS:  COWS Total Score: 1  Treatment Plan Summary: Daily contact with patient to assess and evaluate symptoms and progress in treatment and Medication management Supportive approach/coping skills Depression; continue to work with the Wellbutrin, increase to 300 mg in AM Insomnia/ruminative thinking; will try Seroquel 100 mg HS Cocaine abuse; continue to work a relapse prevention plan   Medical Decision Making:  Review of Psycho-Social Stressors (1), Review or order clinical lab tests (1) and Review of New  Medication or Change in Dosage (2)     Auren Valdes A 09/11/2014, 5:18 PM

## 2014-09-11 NOTE — Progress Notes (Signed)
Pt did not attend the evening AA speaker meeting. 

## 2014-09-11 NOTE — Progress Notes (Signed)
Pt attended spiritual care group on grief and loss facilitated by chaplain Burnis Kingfisher. Group opened with brief discussion and psycho-social ed around grief and loss in relationships and in relation to self - identifying life patterns, circumstances, changes that cause losses. Established group norm of speaking from own life experience. Group goal of establishing open and affirming space for members to share loss and experience with grief, normalize grief experience and provide psycho social education and grief support.  Group drew on narrative and Adlerian therapeutic modalities.    Kennet joined group after it began, stating he wasn't sure if this group was for him because he has not lost anyone in his life.  Facilitator led conversation expanding idea of grief to include loss of self and relationships.  Sinan resonated with this and described having felt overwhelmed and having experienced SI.  Resonated with other group members whose coping mechanism for grief was substance or ETOH.  Lauro described feeling more like himself since arriving at [facility] and heard affirmation from other group members who have noticed a difference in his presentation.

## 2014-09-11 NOTE — Progress Notes (Signed)
Recreation Therapy Notes  Date: 07.25.16 Time: 9:30 am Location: [address] Group Room  Group Topic: Stress Management  Goal Area(s) Addresses:  Patient will verbalize importance of using healthy stress management.  Patient will identify positive emotions associated with healthy stress management.   Intervention: Stress Management  Activity :  Progressive Muscle Relaxation.  LRT introduced and educated patients on stress management technique of progressive muscle relaxation.  A script was used to deliver the technique to patients.  Patients were asked to follow script read allowed by LRT to engage in practicing the stress management technique.  Education:  Stress Management, Discharge Planning.   Education Outcome: Acknowledges edcuation/In group clarification offered/Needs additional education  Clinical Observations/Feedback: Patient did not attend group.   Bobak Oguinn, LRT/CTRS         Ailah Barna A 09/11/2014 1:19 PM 

## 2014-09-12 LAB — RAPID URINE DRUG SCREEN, HOSP PERFORMED
Amphetamines: NOT DETECTED
Barbiturates: NOT DETECTED
Benzodiazepines: NOT DETECTED
Cocaine: POSITIVE — AB
Opiates: NOT DETECTED
Tetrahydrocannabinol: NOT DETECTED

## 2014-09-12 NOTE — Progress Notes (Signed)
D:  +ve SI-contracts for safety. Pt denies HI/AVH. Pt is pleasant and cooperative. Pt feels ashamed of his SA and really wants to stop. Pt very shy and fowarded little upon approach, but after talking pt very open and appeared to have some insight into his Tx.   A: Pt was offered support and encouragement. Pt was given scheduled medications. Pt was encourage to attend groups. Q 15 minute checks were done for safety.   R: Pt is taking medication. Pt has no complaints at this time.Pt receptive to treatment and safety maintained on unit.

## 2014-09-12 NOTE — Plan of Care (Signed)
Problem: Ineffective individual coping Goal: LTG: Patient will report a decrease in negative feelings Outcome: Progressing Pt stated he was feeling a little better, after a group today when they talked about their coping skills.   Problem: Alteration in mood & ability to function due to Goal: LTG-Pt reports reduction in suicidal thoughts (Patient reports reduction in suicidal thoughts and is able to verbalize a safety plan for whenever patient is feeling suicidal)  Outcome: Not Progressing Pt still passive SI-contracts for safety  Problem: Alteration in mood Goal: LTG-Patient reports reduction in suicidal thoughts (Patient reports reduction in suicidal thoughts and is able to verbalize a safety plan for whenever patient is feeling suicidal)  Outcome: Not Progressing Pt still passive SI-contracts

## 2014-09-12 NOTE — [facility] Group Notes (Signed)
[facility] Group Notes:  (Nursing/MHT/Case Management/Adjunct)  Date:  09/12/2014  Time:  3:00 PM  Type of Therapy:  Nurse Education  Participation Level:  Did Not Attend  Participation Quality:  N/A  Affect:  N/A  Cognitive:    Insight:    Engagement in Group:    Modes of Intervention:  Discussion and Education  Summary of Progress/Problems: Chose not to attend  Wynona Luna 09/12/2014, 3:00 PM

## 2014-09-12 NOTE — Progress Notes (Signed)
[facility] MD Progress Note  09/12/2014 5:54 PM Jose Hays  MRN:  [id number] Subjective:  Jose Hays states he slept better last night. He did not have any nightmares. He does say he is more irritable in th morning. He still is dealing with the uncertain of not knowing where he is going from here. He is waiting for his pastor to come today as he has some ideas, resources he might be able to get him in touch with. States he is trying to stay positive. He has plans to put together a Christian band. He wants to get himself together to be a father to his child. But then states he starts thinking negatively again and his mood goes down. He has tolerated the Wellbutrin well so far unless the irritability is secondary to the Wellbutrin  Principal Problem: Severe recurrent major depression without psychotic features Diagnosis:   Patient Active Problem List   Diagnosis Date Noted  . Severe recurrent major depression without psychotic features [F33.2] 09/08/2014  . Cocaine abuse with cocaine-induced mood disorder [F14.14] 09/08/2014  . Mood disorder [F39] 09/07/2014   Total Time spent with patient: 30 minutes   Past Medical History:  Past Medical History  Diagnosis Date  . Coronary artery disease   . Hypertension    History reviewed. No pertinent past surgical history. Family History: History reviewed. No pertinent family history. Social History:  History  Alcohol Use No     History  Drug Use  . Yes  . Special: Cocaine, Marijuana    History   Social History  . Marital Status: Single    Spouse Name: N/A  . Number of Children: N/A  . Years of Education: N/A   Social History Main Topics  . Smoking status: Current Every Day Smoker -- 0.50 packs/day  . Smokeless tobacco: Never Used  . Alcohol Use: No  . Drug Use: Yes    Special: Cocaine, Marijuana  . Sexual Activity: Not on file   Other Topics Concern  . None   Social History Narrative   Additional History:    Sleep: better  Appetite:   Fair   Assessment:   Musculoskeletal: Strength & Muscle Tone: within normal limits Gait & Station: normal Patient leans: normal   Psychiatric Specialty Exam: Physical Exam  Review of Systems  Constitutional: Negative.   HENT: Negative.   Eyes: Negative.   Respiratory: Negative.   Cardiovascular: Negative.   Gastrointestinal: Negative.   Genitourinary: Negative.   Musculoskeletal: Negative.   Skin: Negative.   Neurological: Negative.   Endo/Heme/Allergies: Negative.   Psychiatric/Behavioral: Positive for depression and substance abuse.    Blood pressure 98/70, pulse 72, temperature 97.6 F (36.4 C), temperature source Oral, resp. rate 20, height 6\' 8"  (2.032 m), weight 173.728 kg (383 lb).Body mass index is 42.07 kg/(m^2).  General Appearance: Fairly Groomed  Patent attorney::  Fair  Speech:  Clear and Coherent  Volume:  Normal  Mood:  Anxious and Depressed  Affect:  Depressed and worried  Thought Process:  Coherent and Goal Directed  Orientation:  Full (Time, Place, and Person)  Thought Content:  symptoms events worries concerns  Suicidal Thoughts:  No  Homicidal Thoughts:  No  Memory:  Immediate;   Fair Recent;   Fair Remote;   Fair  Judgement:  Fair  Insight:  Present  Psychomotor Activity:  Restlessness  Concentration:  Fair  Recall:  [employer] of Knowledge:Fair  Language: Fair  Akathisia:  No  Handed:  Right  AIMS (if  indicated):     Assets:  Desire for Improvement  ADL's:  Intact  Cognition: WNL  Sleep:  Number of Hours: 5.75     Current Medications: Current Facility-Administered Medications  Medication Dose Route Frequency Provider Last Rate Last Dose  . acetaminophen (TYLENOL) tablet 650 mg  650 mg Oral Q4H PRN Earney Navy, NP      . alum & mag hydroxide-simeth (MAALOX/MYLANTA) 200-200-20 MG/5ML suspension 30 mL  30 mL Oral PRN Earney Navy, NP      . buPROPion (WELLBUTRIN XL) 24 hr tablet 300 mg  300 mg Oral Daily Rachael Fee, MD    300 mg at 09/12/14 1610  . hydrOXYzine (ATARAX/VISTARIL) tablet 50 mg  50 mg Oral TID PRN Worthy Flank, NP   50 mg at 09/08/14 2129  . ibuprofen (ADVIL,MOTRIN) tablet 600 mg  600 mg Oral Q8H PRN Earney Navy, NP      . magnesium hydroxide (MILK OF MAGNESIA) suspension 30 mL  30 mL Oral Daily PRN Earney Navy, NP      . nicotine (NICODERM CQ - dosed in mg/24 hours) patch 21 mg  21 mg Transdermal Daily PRN Earney Navy, NP   21 mg at 09/11/14 0830  . nicotine (NICODERM CQ - dosed in mg/24 hours) patch 21 mg  21 mg Transdermal Daily Rachael Fee, MD   21 mg at 09/12/14 0907  . ondansetron (ZOFRAN) tablet 4 mg  4 mg Oral Q8H PRN Earney Navy, NP      . QUEtiapine (SEROQUEL) tablet 100 mg  100 mg Oral QHS Rachael Fee, MD   100 mg at 09/11/14 2229    Lab Results:  Results for orders placed or performed during the hospital encounter of 09/07/14 (from the past 48 hour(s))  Urine rapid drug screen (hosp performed)     Status: Abnormal   Collection Time: 09/12/14  7:04 AM  Result Value Ref Range   Opiates NONE DETECTED NONE DETECTED   Cocaine POSITIVE (A) NONE DETECTED   Benzodiazepines NONE DETECTED NONE DETECTED   Amphetamines NONE DETECTED NONE DETECTED   Tetrahydrocannabinol NONE DETECTED NONE DETECTED   Barbiturates NONE DETECTED NONE DETECTED    Comment:        DRUG SCREEN FOR MEDICAL PURPOSES ONLY.  IF CONFIRMATION IS NEEDED FOR ANY PURPOSE, NOTIFY LAB WITHIN 5 DAYS.        LOWEST DETECTABLE LIMITS FOR URINE DRUG SCREEN Drug Class       Cutoff (ng/mL) Amphetamine      1000 Barbiturate      200 Benzodiazepine   200 Tricyclics       300 Opiates          300 Cocaine          300 THC              50 Performed at [facility]     Physical Findings: AIMS: Facial and Oral Movements Muscles of Facial Expression: None, normal Lips and Perioral Area: None, normal Jaw: None, normal Tongue: None, normal,Extremity Movements Upper (arms,  wrists, hands, fingers): None, normal Lower (legs, knees, ankles, toes): None, normal, Trunk Movements Neck, shoulders, hips: None, normal, Overall Severity Severity of abnormal movements (highest score from questions above): None, normal Incapacitation due to abnormal movements: None, normal Patient's awareness of abnormal movements (rate only patient's report): No Awareness, Dental Status Current problems with teeth and/or dentures?: No Does patient usually wear dentures?: No  CIWA:  CIWA-Ar Total: 1 COWS:  COWS Total Score: 1  Treatment Plan Summary: Daily contact with patient to assess and evaluate symptoms and progress in treatment and Medication management Supportive approach/coping skills Depression; will continue to work with the Wellbutrin XL 300 mg in AM Work with CBT/mindfulness Cocaine dependence; work a relapse prevention plan Explore placement options  Medical Decision Making:  Review of Psycho-Social Stressors (1), Review or order clinical lab tests (1) and Review of Medication Regimen & Side Effects (2)     Beyonka Pitney A 09/12/2014, 5:54 PM

## 2014-09-12 NOTE — Tx Team (Signed)
Interdisciplinary Treatment Plan Update (Adult) Date: 09/12/2014   Time Reviewed: 9:30 AM  Progress in Treatment: Attending groups: Minimally Participating in groups: Minimally Taking medication as prescribed: Yes Tolerating medication: Yes Family/Significant other contact made: No, patient has declined for collateral contact Patient understands diagnosis: Yes Discussing patient identified problems/goals with staff: Yes Medical problems stabilized or resolved: Yes Denies suicidal/homicidal ideation: Yes Issues/concerns per patient self-inventory: Yes Other:  New problem(s) identified: N/A  Discharge Plan or Barriers:   09/12/2014: CSW continuing to assess. Patient unsure of where he plans to stay at discharge- may return to apartment, camp out in woods in [place]., or go to [employer].  Reason for Continuation of Hospitalization:  Depression Anxiety Medication Stabilization   Comments: N/A  Estimated length of stay: 1-2 days   Patient is a 39 year old African American male admitted for SI and depression, reports his relationship with children's mother as a stressor. Patient lives in [place]. Patient has one previous admission to [facility] in 01/2014. Patient will benefit from crisis stabilization, medication evaluation, group therapy, and psycho education in addition to case management for discharge planning. Patient and CSW reviewed pt's identified goals and treatment plan. Pt verbalized understanding and agreed to treatment plan.     Review of initial/current patient goals per problem list:  1. Goal(s): Patient will participate in aftercare plan   Met: No   Target date: 1-2 days   As evidenced by: Patient will participate within aftercare plan AEB aftercare provider and housing plan at discharge being identified.  7/22: Goal not met: CSW assessing for appropriate referrals for pt and will have follow up secured prior to d/c.  7/26: Goal not met:  CSW assessing for appropriate referrals for pt and will have follow up secured prior to d/c.    2. Goal (s): Patient will exhibit decreased depressive symptoms and suicidal ideations.   Met: No   Target date: 1-2 days   As evidenced by: Patient will utilize self rating of depression at 3 or below and demonstrate decreased signs of depression or be deemed stable for discharge by MD.  7/22: Goal not met: Pt presents with flat affect and depressed mood.  Pt admitted with depression rating of 10.  Pt to show decreased sign of depression and a rating of 3 or less before d/c.    7/26: Goal not met: Pt presents with flat affect and depressed mood.  Pt admitted with depression rating of 10.  Pt to show decreased sign of depression and a rating of 3 or less before d/c.     3. Goal(s): Patient will demonstrate decreased signs and symptoms of anxiety.   Met: No   Target date: 1-2 days   As evidenced by: Patient will utilize self rating of anxiety at 3 or below and demonstrated decreased signs of anxiety, or be deemed stable for discharge by MD  7/22: Goal not met: Pt presents with anxious mood and affect.  Pt admitted with anxiety rating of 10.  Pt to show decreased sign of anxiety and a rating of 3 or less before d/c.  7/26: Goal not met: Pt presents with anxious mood and affect.  Pt admitted with anxiety rating of 10.  Pt to show decreased sign of anxiety and a rating of 3 or less before d/c.    4. Goal(s): Patient will demonstrate decreased signs of withdrawal due to substance abuse   Met: Yes   Target date: 1-2 days   As  evidenced by: Patient will produce a CIWA/COWS score of 0, have stable vitals signs, and no symptoms of withdrawal  7/22: Goal met: No withdrawal symptoms reported at this time per medical chart.     Attendees: Patient:    Family:    Physician: Dr. Parke Poisson; Dr. Sabra Heck 09/12/2014 9:30 AM  Nursing: Kerby Nora RN 09/12/2014 9:30 AM  Clinical Social  Worker: Erasmo Downer Azariel Banik,  [place] 09/12/2014 9:30 AM  Other: Peri Maris, LCSWA 09/12/2014 9:30 AM  Other: Lucinda Dell, Beverly Sessions Liaison 09/12/2014 9:30 AM  Other:  Lars Pinks, RN CM 09/12/2014 9:30 AM  Other: Ave Filter, NP 09/12/2014 9:30 AM  Other:       Scribe for Treatment Team:  Tilden Fossa, MSW, [place] [phone]

## 2014-09-12 NOTE — Progress Notes (Signed)
Recreation Therapy Notes  Animal-Assisted Activity (AAA) Program Checklist/Progress Notes Patient Eligibility Criteria Checklist & Daily Group note for Rec Tx Intervention  Date: 07.26.16 Time: 2:45 pm Location: [address] Dayroom  AAA/T Program Assumption of Risk Form signed by Patient/ or Parent Legal Guardian yes  Patient is free of allergies or sever asthma yes  Patient reports no fear of animals yes  Patient reports no history of cruelty to animalsyes  Patient understands his/her participation is voluntary yes  Patient washes hands before animal contact yes  Patient washes hands after animal contact yes  Education: Hand Washing, Appropriate Animal Interaction   Education Outcome: Acknowledges understanding/In group clarification offered/Needs additional education.   Clinical Observations/Feedback:  Patient did not attend group.   [name], LRT/CTRS         Jose Hays 09/12/2014 4:10 PM 

## 2014-09-12 NOTE — Progress Notes (Signed)
Patient ID: Jose Hays, male   DOB: 1975-09-14, 39 y.o.   MRN: [id number] D-Initial contact at am med pass, angry I didn't give his medicine to him at 8 am when he is supposed to get it. Asked him why he didn't come up to get his meds at 8a states it is writers job to bring it to him. Explained that was not the usual routine, he was expected to come get meds and given to him at 901 am. Angry and grumbled "new staff". Asked if he knew the medication he took at this time, and angrily stated I had it right there didn't i know. A-Education provided, medications as ordered monitored for safety. R-Unpleasant with Clinical research associate but interacts with peers. Did not attend nursing group this am.

## 2014-09-12 NOTE — Progress Notes (Signed)
Pt attended the evening AA speaker meeting. 

## 2014-09-12 NOTE — [facility] Group Notes (Signed)
[facility] LCSW Group Therapy 09/12/2014  1:15 PM   Type of Therapy: Group Therapy  Participation Level: Did Not Attend. Patient invited to participate but declined.   Anjolaoluwa Siguenza, MSW, LCSWA Clinical Social Worker [facility] [phone]   

## 2014-09-13 NOTE — Progress Notes (Signed)
[facility] MD Progress Note  09/13/2014 5:55 PM Jose Hays  MRN:  [id number] Subjective:  Jose Hays states that he is trying to get his life back together. Still dealing with a lot of shame and guilt for having relapsed. States he wants a chance to start fresh. He wants to get better for his sake and in order to have a relationship with his daughter. He did sleep better last night. Has seen some irritability in the morning but was not as bad today Principal Problem: Severe recurrent major depression without psychotic features Diagnosis:   Patient Active Problem List   Diagnosis Date Noted  . Severe recurrent major depression without psychotic features [F33.2] 09/08/2014  . Cocaine abuse with cocaine-induced mood disorder [F14.14] 09/08/2014  . Mood disorder [F39] 09/07/2014   Total Time spent with patient: 30 minutes   Past Medical History:  Past Medical History  Diagnosis Date  . Coronary artery disease   . Hypertension    History reviewed. No pertinent past surgical history. Family History: History reviewed. No pertinent family history. Social History:  History  Alcohol Use No     History  Drug Use  . Yes  . Special: Cocaine, Marijuana    History   Social History  . Marital Status: Single    Spouse Name: N/A  . Number of Children: N/A  . Years of Education: N/A   Social History Main Topics  . Smoking status: Current Every Day Smoker -- 0.50 packs/day  . Smokeless tobacco: Never Used  . Alcohol Use: No  . Drug Use: Yes    Special: Cocaine, Marijuana  . Sexual Activity: Not on file   Other Topics Concern  . None   Social History Narrative   Additional History:    Sleep: Fair  Appetite:  Fair   Assessment:   Musculoskeletal: Strength & Muscle Tone: within normal limits Gait & Station: normal Patient leans: normal   Psychiatric Specialty Exam: Physical Exam  Review of Systems  Constitutional: Negative.   HENT: Negative.   Eyes: Negative.   Respiratory:  Negative.   Cardiovascular: Negative.   Gastrointestinal: Negative.   Genitourinary: Negative.   Musculoskeletal: Negative.   Skin: Negative.   Neurological: Negative.   Endo/Heme/Allergies: Negative.   Psychiatric/Behavioral: Positive for depression and substance abuse. The patient is nervous/anxious.     Blood pressure 137/86, pulse 67, temperature 98 F (36.7 C), temperature source Oral, resp. rate 18, height  (2.032 m), weight 173.728 kg (383 lb).Body mass index is 42.07 kg/(m^2).  General Appearance: Fairly Groomed  Patent attorney::  Fair  Speech:  Clear and Coherent  Volume:  Decreased  Mood:  Anxious and Depressed  Affect:  Restricted  Thought Process:  Coherent and Goal Directed  Orientation:  Full (Time, Place, and Person)  Thought Content:  symptoms events worries concerns  Suicidal Thoughts:  No  Homicidal Thoughts:  No  Memory:  Immediate;   Fair Recent;   Fair Remote;   Fair  Judgement:  Fair  Insight:  Present  Psychomotor Activity:  Restlessness  Concentration:  Fair  Recall:  [employer] of Knowledge:Fair  Language: Fair  Akathisia:  No  Handed:  Right  AIMS (if indicated):     Assets:  Desire for Improvement  ADL's:  Intact  Cognition: WNL  Sleep:  Number of Hours: 5.75     Current Medications: Current Facility-Administered Medications  Medication Dose Route Frequency Provider Last Rate Last Dose  . acetaminophen (TYLENOL) tablet 650 mg  650 mg Oral Q4H PRN Earney Navy, NP      . alum & mag hydroxide-simeth (MAALOX/MYLANTA) 200-200-20 MG/5ML suspension 30 mL  30 mL Oral PRN Earney Navy, NP      . buPROPion (WELLBUTRIN XL) 24 hr tablet 300 mg  300 mg Oral Daily Rachael Fee, MD   300 mg at 09/13/14 0854  . hydrOXYzine (ATARAX/VISTARIL) tablet 50 mg  50 mg Oral TID PRN Worthy Flank, NP   50 mg at 09/08/14 2129  . ibuprofen (ADVIL,MOTRIN) tablet 600 mg  600 mg Oral Q8H PRN Earney Navy, NP   600 mg at 09/13/14 0936  . magnesium  hydroxide (MILK OF MAGNESIA) suspension 30 mL  30 mL Oral Daily PRN Earney Navy, NP      . nicotine (NICODERM CQ - dosed in mg/24 hours) patch 21 mg  21 mg Transdermal Daily PRN Earney Navy, NP   21 mg at 09/11/14 0830  . nicotine (NICODERM CQ - dosed in mg/24 hours) patch 21 mg  21 mg Transdermal Daily Rachael Fee, MD   21 mg at 09/13/14 0854  . ondansetron (ZOFRAN) tablet 4 mg  4 mg Oral Q8H PRN Earney Navy, NP      . QUEtiapine (SEROQUEL) tablet 100 mg  100 mg Oral QHS Rachael Fee, MD   100 mg at 09/12/14 2249    Lab Results:  Results for orders placed or performed during the hospital encounter of 09/07/14 (from the past 48 hour(s))  Urine rapid drug screen (hosp performed)     Status: Abnormal   Collection Time: 09/12/14  7:04 AM  Result Value Ref Range   Opiates NONE DETECTED NONE DETECTED   Cocaine POSITIVE (A) NONE DETECTED   Benzodiazepines NONE DETECTED NONE DETECTED   Amphetamines NONE DETECTED NONE DETECTED   Tetrahydrocannabinol NONE DETECTED NONE DETECTED   Barbiturates NONE DETECTED NONE DETECTED    Comment:        DRUG SCREEN FOR MEDICAL PURPOSES ONLY.  IF CONFIRMATION IS NEEDED FOR ANY PURPOSE, NOTIFY LAB WITHIN 5 DAYS.        LOWEST DETECTABLE LIMITS FOR URINE DRUG SCREEN Drug Class       Cutoff (ng/mL) Amphetamine      1000 Barbiturate      200 Benzodiazepine   200 Tricyclics       300 Opiates          300 Cocaine          300 THC              50 Performed at [facility]     Physical Findings: AIMS: Facial and Oral Movements Muscles of Facial Expression: None, normal Lips and Perioral Area: None, normal Jaw: None, normal Tongue: None, normal,Extremity Movements Upper (arms, wrists, hands, fingers): None, normal Lower (legs, knees, ankles, toes): None, normal, Trunk Movements Neck, shoulders, hips: None, normal, Overall Severity Severity of abnormal movements (highest score from questions above): None,  normal Incapacitation due to abnormal movements: None, normal Patient's awareness of abnormal movements (rate only patient's report): No Awareness, Dental Status Current problems with teeth and/or dentures?: No Does patient usually wear dentures?: No  CIWA:  CIWA-Ar Total: 1 COWS:  COWS Total Score: 1  Treatment Plan Summary: Daily contact with patient to assess and evaluate symptoms and progress in treatment and Medication management Supportive approach/coping skills Cocaine dependence; continue to work a relapse prevention plan Depression; continue to work  with the Wellbutrin XL 300 mg in AM CBT/mindfulness  Medical Decision Making:  Review of Psycho-Social Stressors (1), Review or order clinical lab tests (1) and Review of Medication Regimen & Side Effects (2)     Vineta Carone A 09/13/2014, 5:55 PM

## 2014-09-13 NOTE — [facility] Group Notes (Signed)
[facility] LCSW Group Therapy 09/13/2014  1:15 PM   Type of Therapy: Group Therapy  Participation Level: Did Not Attend. Patient invited to participate but declined.   Shevelle Smither, MSW, LCSWA Clinical Social Worker [facility] [phone]   

## 2014-09-13 NOTE — Progress Notes (Signed)
Patient ID: Jose Hays, male   DOB: 18-Apr-1975, 39 y.o.   MRN: [id number]  Pt currently presents with a flat affect and depressed behavior. Pt reports that "My ex called me and asked me if I was going to be out by Friday. She said if I wasn't she is going to leave my daughter with her irresponsible son. I'm worried about that. I know I need to go to longer term treatment, she just knows what to say to push my buttons, now I don't know what to do." Pt slightly more irritable today than previously, pt torn about going to further treatment post discharge.   Pt provided with medications per providers orders. Pt's labs and vitals were monitored throughout the day. Pt supported emotionally and encouraged to express concerns and questions. Pt educated on medications. Pt given a 1:1. Pt demonstrated coping skills for anxiety and assertiveness.   Pt's safety ensured with 15 minute and environmental checks. Pt currently denies SI/HI and A/V hallucinations. Pt verbally agrees to seek staff if SI/HI or A/VH occurs and to consult with staff before acting on these thoughts. Will continue POC.

## 2014-09-13 NOTE — Progress Notes (Signed)
D: Pt denies SI/HI/AVH. Pt is pleasant and cooperative. Pt stated he has to make some difficult decisions. Pt stated what he has to do will keep him from seeing his children a lot in the next year. Pt worried , but pt knows th decision he has to make, but fear of progressing, growing, changing, and the un-known has him pondering over the obvious solution to his problems. Pt appears to have a lot of insight, but pt has a lot of fear of the future do to its uncertainy.   A: Pt was offered support and encouragement. Pt was given scheduled medications. Pt was encourage to attend groups. Q 15 minute checks were done for safety.   R:Pt attends groups and interacts well with peers and staff. Pt is taking medication.Pt receptive to treatment and safety maintained on unit.

## 2014-09-13 NOTE — Progress Notes (Signed)
Pt was invited to attend the evening NA speaker meeting, however he stayed in his room and slept.

## 2014-09-13 NOTE — Progress Notes (Signed)
Recreation Therapy Notes  Date: 07.27.16 Time: 9:30 am Location: 300 [address] Group Room  Group Topic: Stress Management  Goal Area(s) Addresses:  Patient will verbalize importance of using healthy stress management.  Patient will identify positive emotions associated with healthy stress management.   Intervention: Stress Management  Activity :  Guided Imagery Script.  LRT introduced the technique of guided imagery.  Hays script was used to deliver the technique to the patients.  Patients were asked to follow the script read Hays loud by LRT to engage in the technique of guided imagery.  Education:  Stress Management, Discharge Planning.   Education Outcome: Acknowledges edcuation/In group clarification offered/Needs additional education  Clinical Observations/Feedback: Patient did not attend group.   Tammee Thielke, LRT/CTRS         Jose Hays 09/13/2014 12:17 PM 

## 2014-09-13 NOTE — Progress Notes (Signed)
D: Patient in his room on approach.  Patient states he has been sad today.  Patient states he does not know what he is going to do.  Patient states he has been in bed today.  Patient states he is passive SI but verbally contracts for safety.  Patient denies HI and denies AVH. A: Staff to monitor Q 15 mins for safety.  Encouragement and support offered.  Patient did not take scheduled medications tonight R: Patient remains safe on the unit.  Patient did not attend group tonight.  Patient visible on the unit and not interacting with peers.

## 2014-09-13 NOTE — Plan of Care (Signed)
Problem: Alteration in mood & ability to function due to Goal: LTG-Pt reports reduction in suicidal thoughts (Patient reports reduction in suicidal thoughts and is able to verbalize a safety plan for whenever patient is feeling suicidal)  Outcome: Progressing Pt denies SI at this time     

## 2014-09-13 NOTE — [facility] Group Notes (Signed)
[facility] LCSW Aftercare Discharge Planning Group Note  09/13/2014  8:45 AM  Participation Quality: Did Not Attend. Patient invited to participate but declined.  Keishawn Darsey, MSW, LCSWA Clinical Social Worker [facility] [phone]    

## 2014-09-14 LAB — RAPID URINE DRUG SCREEN, HOSP PERFORMED
Amphetamines: NOT DETECTED
BARBITURATES: NOT DETECTED
Benzodiazepines: NOT DETECTED
Cocaine: POSITIVE — AB
Opiates: NOT DETECTED
Tetrahydrocannabinol: NOT DETECTED

## 2014-09-14 NOTE — [facility] Group Notes (Signed)
[facility] LCSW Group Therapy  09/14/2014 3:06 PM  Type of Therapy:  Group Therapy  Participation Level:  Minimal, briefly attended, left without contributing much to group  Summary of Progress/Problems:  Finding Balance in Life. Today's group focused on defining balance in one's own words, identifying things that can knock one off balance, and exploring healthy ways to maintain balance in life. Group members were asked to provide an example of a time when they felt off balance, describe how they handled that situation, and process healthier ways to regain balance in the future. Group members were asked to share the most important tool for maintaining balance that they learned while at [facility] and how they plan to apply this method after discharge.   Jose Hays 09/14/2014, 3:06 PM

## 2014-09-14 NOTE — [facility] Group Notes (Signed)
[facility] Group Notes:  (Nursing/MHT/Case Management/Adjunct)  Date:  09/14/2014  Time:  10:16 AM  Type of Therapy:  Nurse Education  Participation Level:  Did Not Attend   Loren Racer 09/14/2014, 10:16 AM

## 2014-09-14 NOTE — Tx Team (Addendum)
Interdisciplinary Treatment Plan Update (Adult) Date: 09/14/2014   Time Reviewed: 9:30 AM  Progress in Treatment: Attending groups: Minimally Participating in groups: Minimally Taking medication as prescribed: Yes Tolerating medication: Yes Family/Significant other contact made: No, patient has declined for collateral contact Patient understands diagnosis: Yes Discussing patient identified problems/goals with staff: Yes Medical problems stabilized or resolved: Yes Denies suicidal/homicidal ideation: Yes Issues/concerns per patient self-inventory: Yes Other:  New problem(s) identified: N/A  Discharge Plan or Barriers:   09/14/2014: CSW continuing to assess. Patient unsure of where he plans to stay at discharge- may return to apartment, camp out in woods in [place]., or go to [employer].  Reason for Continuation of Hospitalization:  Depression Anxiety Medication Stabilization   Comments: N/A  Estimated length of stay: 1-2 days   Patient is a 39 year old African American male admitted for SI and depression, reports his relationship with children's mother as a stressor. Patient lives in [place]. Patient has one previous admission to [facility] in 01/2014. Patient will benefit from crisis stabilization, medication evaluation, group therapy, and psycho education in addition to case management for discharge planning. Patient and CSW reviewed pt's identified goals and treatment plan. Pt verbalized understanding and agreed to treatment plan.     Review of initial/current patient goals per problem list:  1. Goal(s): Patient will participate in aftercare plan   Met: No   Target date: 1-2 days   As evidenced by: Patient will participate within aftercare plan AEB aftercare provider and housing plan at discharge being identified.  7/22: Goal not met: CSW assessing for appropriate referrals for pt and will have follow up secured prior to d/c.  7/26: Goal not met:  CSW assessing for appropriate referrals for pt and will have follow up secured prior to d/c. 7/28: Goal met:   Plans to return home and follow up w [facility] and [facility].  Will be given bus pass for [facility] if needed    2. Goal (s): Patient will exhibit decreased depressive symptoms and suicidal ideations.   Met: No   Target date: 1-2 days   As evidenced by: Patient will utilize self rating of depression at 3 or below and demonstrate decreased signs of depression or be deemed stable for discharge by MD.  7/22: Goal not met: Pt presents with flat affect and depressed mood.  Pt admitted with depression rating of 10.  Pt to show decreased sign of depression and a rating of 3 or less before d/c.    7/26: Goal not met: Pt presents with flat affect and depressed mood.  Pt admitted with depression rating of 10.  Pt to show decreased sign of depression and a rating of 3 or less before d/c.  7/28:  Goal not met, patient continues to present w depressed mood and flat affect, endorses depressive sx, hopelessness  7/29:  Goal not met, pt upset that he has to take bus home at dc, wants taxi.  States "now my depression is at 6", appears to be circumstantial, MD assessing for discharge stability  3. Goal(s): Patient will demonstrate decreased signs and symptoms of anxiety.   Met: No   Target date: 1-2 days   As evidenced by: Patient will utilize self rating of anxiety at 3 or below and demonstrated decreased signs of anxiety, or be deemed stable for discharge by MD  7/22: Goal not met: Pt presents with anxious mood and affect.  Pt admitted with anxiety rating of 10.  Pt  to show decreased sign of anxiety and a rating of 3 or less before d/c.  7/26: Goal not met: Pt presents with anxious mood and affect.  Pt admitted with anxiety rating of 10.  Pt to show decreased sign of anxiety and a rating of 3 or less before d/c. 7/28:  Patient continues to present w withdrawn mood, little interaction w peers and  staff, appears anxious.  7/29:  Patient upset at having to take bus - "Donnald Garre never taken the bus in my life" - says he wants taxi, upset that [facility] will not provide, "now my anxiety is 8".  Anxiety appears to be situationally related.        4. Goal(s): Patient will demonstrate decreased signs of withdrawal due to substance abuse   Met: Yes   Target date: 1-2 days   As evidenced by: Patient will produce a CIWA/COWS score of 0, have stable vitals signs, and no symptoms of withdrawal  7/22: Goal met: No withdrawal symptoms reported at this time per medical chart.     Attendees: Patient:    Family:    Physician:Dr. Sabra Heck 09/14/2014 9:30 AM  Nursing: Kerby Nora RN 09/14/2014 9:30 AM  Clinical Social Worker: Eduard Clos,  [place] 09/14/2014 9:30 AM  Other: Edwyna Shell, LCSW 09/14/2014 9:30 AM  Other: Lucinda Dell, [place] Sessions Liaison 09/14/2014 9:30 AM  Other:  Lars Pinks, RN CM 09/14/2014 9:30 AM  Other: Ave Filter, NP 09/14/2014 9:30 AM  Other:       Scribe for Treatment Team:  Edwyna Shell, LCSW

## 2014-09-14 NOTE — Progress Notes (Signed)
[facility] MD Progress Note  09/14/2014 6:03 PM Jose Hays  MRN:  [id number] Subjective:  Papa continues to endorse worry about what is going to happen once he gets out of here. He states he really wants to get his life back together, wants to move past what happened with his EX GF and focus on himself and get healthy for his child's sake Principal Problem: Severe recurrent major depression without psychotic features Diagnosis:   Patient Active Problem List   Diagnosis Date Noted  . Severe recurrent major depression without psychotic features [F33.2] 09/08/2014  . Cocaine abuse with cocaine-induced mood disorder [F14.14] 09/08/2014  . Mood disorder [F39] 09/07/2014   Total Time spent with patient: 30 minutes   Past Medical History:  Past Medical History  Diagnosis Date  . Coronary artery disease   . Hypertension    History reviewed. No pertinent past surgical history. Family History: History reviewed. No pertinent family history. Social History:  History  Alcohol Use No     History  Drug Use  . Yes  . Special: Cocaine, Marijuana    History   Social History  . Marital Status: Single    Spouse Name: N/A  . Number of Children: N/A  . Years of Education: N/A   Social History Main Topics  . Smoking status: Current Every Day Smoker -- 0.50 packs/day  . Smokeless tobacco: Never Used  . Alcohol Use: No  . Drug Use: Yes    Special: Cocaine, Marijuana  . Sexual Activity: Not on file   Other Topics Concern  . None   Social History Narrative   Additional History:    Sleep: Fair  Appetite:  Fair   Assessment:   Musculoskeletal: Strength & Muscle Tone: within normal limits Gait & Station: normal Patient leans: normal   Psychiatric Specialty Exam: Physical Exam  Review of Systems  Constitutional: Negative.   HENT: Negative.   Eyes: Negative.   Respiratory: Negative.   Cardiovascular: Negative.   Gastrointestinal: Negative.   Genitourinary: Negative.    Musculoskeletal: Negative.   Skin: Negative.   Neurological: Negative.   Endo/Heme/Allergies: Negative.   Psychiatric/Behavioral: Positive for depression and substance abuse.    Blood pressure 143/94, pulse 70, temperature 98.2 F (36.8 C), temperature source Oral, resp. rate 20, height  (2.032 m), weight 173.728 kg (383 lb).Body mass index is 42.07 kg/(m^2).  General Appearance: Fairly Groomed  Patent attorney::  Fair  Speech:  Clear and Coherent  Volume:  Normal  Mood:  Anxious and worried  Affect:  Appropriate  Thought Process:  Coherent and Goal Directed  Orientation:  Full (Time, Place, and Person)  Thought Content:  symptoms events worries concerns  Suicidal Thoughts:  No  Homicidal Thoughts:  No  Memory:  Immediate;   Fair Recent;   Fair Remote;   Fair  Judgement:  Fair  Insight:  Present  Psychomotor Activity:  Restlessness  Concentration:  Fair  Recall:  [employer] of Knowledge:Fair  Language: Fair  Akathisia:  No  Handed:  Right  AIMS (if indicated):     Assets:  Desire for Improvement  ADL's:  Intact  Cognition: WNL  Sleep:  Number of Hours: 5.5     Current Medications: Current Facility-Administered Medications  Medication Dose Route Frequency Provider Last Rate Last Dose  . acetaminophen (TYLENOL) tablet 650 mg  650 mg Oral Q4H PRN Earney Navy, NP      . alum & mag hydroxide-simeth (MAALOX/MYLANTA) 200-200-20 MG/5ML suspension 30 mL  30 mL Oral PRN Earney Navy, NP      . buPROPion (WELLBUTRIN XL) 24 hr tablet 300 mg  300 mg Oral Daily Rachael Fee, MD   300 mg at 09/14/14 0820  . hydrOXYzine (ATARAX/VISTARIL) tablet 50 mg  50 mg Oral TID PRN Worthy Flank, NP   50 mg at 09/08/14 2129  . ibuprofen (ADVIL,MOTRIN) tablet 600 mg  600 mg Oral Q8H PRN Earney Navy, NP   600 mg at 09/13/14 0936  . magnesium hydroxide (MILK OF MAGNESIA) suspension 30 mL  30 mL Oral Daily PRN Earney Navy, NP      . nicotine (NICODERM CQ - dosed in  mg/24 hours) patch 21 mg  21 mg Transdermal Daily PRN Earney Navy, NP   21 mg at 09/11/14 0830  . nicotine (NICODERM CQ - dosed in mg/24 hours) patch 21 mg  21 mg Transdermal Daily Rachael Fee, MD   21 mg at 09/14/14 0816  . ondansetron (ZOFRAN) tablet 4 mg  4 mg Oral Q8H PRN Earney Navy, NP      . QUEtiapine (SEROQUEL) tablet 100 mg  100 mg Oral QHS Rachael Fee, MD   100 mg at 09/12/14 2249    Lab Results:  Results for orders placed or performed during the hospital encounter of 09/07/14 (from the past 48 hour(s))  Urine rapid drug screen (hosp performed)     Status: Abnormal   Collection Time: 09/14/14  7:02 AM  Result Value Ref Range   Opiates NONE DETECTED NONE DETECTED   Cocaine POSITIVE (A) NONE DETECTED   Benzodiazepines NONE DETECTED NONE DETECTED   Amphetamines NONE DETECTED NONE DETECTED   Tetrahydrocannabinol NONE DETECTED NONE DETECTED   Barbiturates NONE DETECTED NONE DETECTED    Comment:        DRUG SCREEN FOR MEDICAL PURPOSES ONLY.  IF CONFIRMATION IS NEEDED FOR ANY PURPOSE, NOTIFY LAB WITHIN 5 DAYS.        LOWEST DETECTABLE LIMITS FOR URINE DRUG SCREEN Drug Class       Cutoff (ng/mL) Amphetamine      1000 Barbiturate      200 Benzodiazepine   200 Tricyclics       300 Opiates          300 Cocaine          300 THC              50 Performed at [facility]     Physical Findings: AIMS: Facial and Oral Movements Muscles of Facial Expression: None, normal Lips and Perioral Area: None, normal Jaw: None, normal Tongue: None, normal,Extremity Movements Upper (arms, wrists, hands, fingers): None, normal Lower (legs, knees, ankles, toes): None, normal, Trunk Movements Neck, shoulders, hips: None, normal, Overall Severity Severity of abnormal movements (highest score from questions above): None, normal Incapacitation due to abnormal movements: None, normal Patient's awareness of abnormal movements (rate only patient's report): No  Awareness, Dental Status Current problems with teeth and/or dentures?: No Does patient usually wear dentures?: No  CIWA:  CIWA-Ar Total: 1 COWS:  COWS Total Score: 1  Treatment Plan Summary: Daily contact with patient to assess and evaluate symptoms and progress in treatment and Medication management Supportive approach/coping skills Cocaine abuse; work a relapse prevention plan Depression; continue to work with the Wellbutrin as well as a CBT/mindfulness Explore placement options  Medical Decision Making:  Review of Psycho-Social Stressors (1), Review of Medication Regimen & Side Effects (  2) and Review of New Medication or Change in Dosage (2)     Audryna Wendt A 09/14/2014, 6:03 PM

## 2014-09-14 NOTE — Progress Notes (Signed)
Patient ID: Jose Hays, male   DOB: 1975-11-08, 39 y.o.   MRN: [id number] D: Patient denies SI/HI and auditory and visual hallucinations. Patient flat and depressed. Focused on having a drug screen negative for all substances. Stated he wants another drug screen today after being told the one this AM was positive for cocaine. Patient states he does not want to discharge until his screen is negative for all substances.  A: Patient given emotional support from RN. Patient given medications per MD orders. Patient encouraged to attend groups and unit activities. Patient encouraged to come to staff with any questions or concerns.  R: Patient not attending all groups. Patient pleasant. Will continue to monitor patient for safety.

## 2014-09-15 MED ORDER — NICOTINE 21 MG/24HR TD PT24: 21.0000 mg | MEDICATED_PATCH | Freq: Every day | TRANSDERMAL | Status: DC | PRN

## 2014-09-15 MED ORDER — QUETIAPINE FUMARATE 100 MG PO TABS: 100.0000 mg | ORAL_TABLET | Freq: Every day | ORAL | Status: DC

## 2014-09-15 MED ORDER — BUPROPION HCL ER (XL) 300 MG PO TB24: 300.0000 mg | ORAL_TABLET | Freq: Every day | ORAL | Status: DC

## 2014-09-15 MED ORDER — HYDROXYZINE HCL 50 MG PO TABS: 50.0000 mg | ORAL_TABLET | Freq: Three times a day (TID) | ORAL | Status: DC | PRN

## 2014-09-15 NOTE — [facility] Group Notes (Signed)
[facility] LCSW Aftercare Discharge Planning Group Note   09/15/2014 10:16 AM  Participation Quality:  Did not attend, in bed, anticipating discharge  Mood/Affect:  Irritable  Depression Rating:  6  Anxiety Rating:  8  Thoughts of Suicide:  No Will you contract for safety?   NA  Current AVH:  No  Plan for Discharge/Comments:  Return home, [facility]  Transportation Means: Will be given bus pass and PART fare, is demanding taxi as he says he has never taken bus in the past  Supports:  uanble to assess  Sallee Lange

## 2014-09-15 NOTE — Plan of Care (Signed)
Problem: Alteration in mood Goal: STG-Patient is able to discuss feelings and issues (Patient is able to discuss feelings and issues leading to depression)  Outcome: Progressing Pt discussed desire to discharge with a negative cocaine test result. Want to leave hospital with a clean slat

## 2014-09-15 NOTE — Progress Notes (Signed)
Patient ID: Jose Hays, male   DOB: 1975-09-21, 39 y.o.   MRN: [id number] D: Patient in room meditating on approach. Pt reports feeling anxious about his positive cocaine test results. Pt reports he is scheduled to discharge tomorrow but wants to leave with a negative result. Pt want to leave with a clean slat from the hospital while he start to rebuild his life. Pt denies SI/HI/AVH and pain. Cooperative with assessment. No acute distressed noted at this time.   A: Met with pt 1:1.  Support and encouragement provided to attend groups and engage in milieu. Pt encouraged to discuss feelings and come to staff with any question or concerns.   R: Patient remains safe is safe.

## 2014-09-15 NOTE — [facility] Suicide Risk Assessment (Signed)
[facility] Discharge Suicide Risk Assessment   Demographic Factors:  Male  Total Time spent with patient: 30 minutes  Musculoskeletal: Strength & Muscle Tone: within normal limits Gait & Station: normal Patient leans: normal  Psychiatric Specialty Exam: Physical Exam  Review of Systems  Constitutional: Negative.   HENT: Negative.   Eyes: Negative.   Respiratory: Negative.   Cardiovascular: Negative.   Gastrointestinal: Negative.   Genitourinary: Negative.   Musculoskeletal: Negative.   Skin: Negative.   Neurological: Negative.   Endo/Heme/Allergies: Negative.   Psychiatric/Behavioral: Positive for substance abuse.    Blood pressure 118/79, pulse 68, temperature 98 F (36.7 C), temperature source Oral, resp. rate 20, height 6\' 8"  (2.032 m), weight 173.728 kg (383 lb).Body mass index is 42.07 kg/(m^2).  General Appearance: Fairly Groomed  Patent attorney::  Fair  Speech:  Clear and Coherent409  Volume:  Normal  Mood:  worried  Affect:  Appropriate  Thought Process:  Coherent and Goal Directed  Orientation:  Full (Time, Place, and Person)  Thought Content:  plans as he moves on, relapse prevention plan  Suicidal Thoughts:  No  Homicidal Thoughts:  No  Memory:  Immediate;   Fair Recent;   Fair Remote;   Fair  Judgement:  Fair  Insight:  Present  Psychomotor Activity:  Normal  Concentration:  Fair  Recall:  [employer] of Knowledge:Fair  Language: Fair  Akathisia:  No  Handed:  Right  AIMS (if indicated):     Assets:  Desire for Improvement Talents/Skills  Sleep:  Number of Hours: 5.5  Cognition: WNL  ADL's:  Intact   Have you used any form of tobacco in the last 30 days? (Cigarettes, Smokeless Tobacco, Cigars, and/or Pipes): Yes  Has this patient used any form of tobacco in the last 30 days? (Cigarettes, Smokeless Tobacco, Cigars, and/or Pipes) Yes, Prescription not provided because: nicotine patches given  Mental Status Per Nursing Assessment::   On Admission:   Suicidal ideation indicated by patient  Current Mental Status by Physician: In full contact with reality. There are no active S/S of withdrawal. There are no active SI plans or intent. He is going to stay with a friend in [facility] and eventually might made it to [place]. 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 He states he is committed to abstinence. Wants to focus on getting his life back together so he can be there for his daughter   Loss Factors: Loss of significant relationship and Financial problems/change in socioeconomic status  Historical Factors: NA  Risk Reduction Factors:   Sense of responsibility to family  Continued Clinical Symptoms:  Alcohol/Substance Abuse/Dependencies  Cognitive Features That Contribute To Risk:  None    Suicide Risk:  Minimal: No identifiable suicidal ideation.  Patients presenting with no risk factors but with morbid ruminations; may be classified as minimal risk based on the severity of the depressive symptoms  Principal Problem: Severe recurrent major depression without psychotic features Discharge Diagnoses:  Patient Active Problem List   Diagnosis Date Noted  . Severe recurrent major depression without psychotic features [F33.2] 09/08/2014  . Cocaine abuse with cocaine-induced mood disorder [F14.14] 09/08/2014  . Mood disorder [F39] 09/07/2014    Follow-up Information    Follow up with [facility] On 09/20/2014.   Why:  Therapy appointment on Wednesday August 3rd at 10am with Theodoro Grist  [place]. Please call office if you need to reschedule appointment.   Contact information:   [address] [place] [address] [phone]      Follow up with [facility] On 09/18/2014.   Why:  Assessment for medication management services on Monday August 1st at 1pm with Nyra Jabs. Please call office if you need to reschedule appointment.    Contact information:   [address]  [place], [place] [address] [phone]      Plan Of Care/Follow-up recommendations:  Activity:  as tolerated Diet:  regular Follow up [facility] as above Is patient on multiple antipsychotic therapies at discharge:  No   Has Patient had three or more failed trials of antipsychotic monotherapy by history:  No  Recommended Plan for Multiple Antipsychotic Therapies: NA    Symeon Puleo A 09/15/2014, 4:49 PM

## 2014-09-15 NOTE — Progress Notes (Signed)
  [facility] Adult Case Management Discharge Plan :  Will you be returning to the same living situation after discharge:  Yes,  return home At discharge, do you have transportation home?: Yes,  will be given bus pass and PART bus fare; wants taxi Do you have the ability to pay for your medications: No.  [facility] will assist  Release of information consent forms completed and in the chart;  Patient's signature needed at discharge.  Patient to Follow up at: Follow-up Information    Follow up with [facility] On 09/20/2014.   Why:  Therapy appointment on Wednesday August 3rd at 10am with Rolan Lipa. Please call office if you need to reschedule appointment.   Contact information:   [address] [place] [address] [phone]      Follow up with [facility] On 09/18/2014.   Why:  Assessment for medication management services on Monday August 1st at 1pm with Nyra Jabs. Please call office if you need to reschedule appointment.   Contact information:   [address]  [place], [place] [address] Phone:([phone]      Safety Planning and Suicide Prevention discussed: No., reviewed w patient and brochure provided by CSW.  Have you used any form of tobacco in the last 30 days? (Cigarettes, Smokeless Tobacco, Cigars, and/or Pipes): Yes  Has patient been referred to the Quitline?: Patient refused referral  Sallee Lange 09/15/2014, 10:36 AM

## 2014-09-15 NOTE — Discharge Summary (Signed)
Physician Discharge Summary Note  Patient:  Jose Hays is an 39 y.o., male MRN:  [id number] DOB:  Oct 09, 1975 Patient phone:  [phone] (home)  Patient address:   [address] [place] [address],   Total Time spent with patient: 1 hour  Date of Admission:  09/07/2014  Date of Discharge: 09-15-14  Reason for Admission:  Worsening symptoms of depression, Cocaine abuse  Principal Problem: Severe recurrent major depression without psychotic features Discharge Diagnoses: Patient Active Problem List   Diagnosis Date Noted  . Severe recurrent major depression without psychotic features [F33.2] 09/08/2014  . Cocaine abuse with cocaine-induced mood disorder [F14.14] 09/08/2014  . Mood disorder [F39] 09/07/2014    Musculoskeletal: Strength & Muscle Tone: within normal limits Gait & Station: normal Patient leans: N/A  Psychiatric Specialty Exam: Physical Exam  Psychiatric: His speech is normal and behavior is normal. Judgment and thought content normal. His mood appears not anxious. His affect is not angry, not blunt, not labile and not inappropriate. Cognition and memory are normal. He does not exhibit a depressed mood.    Review of Systems  Constitutional: Negative.   HENT: Negative.   Eyes: Negative.   Respiratory: Negative.   Cardiovascular: Negative.   Gastrointestinal: Negative.   Genitourinary: Negative.   Musculoskeletal: Negative.   Skin: Negative.   Neurological: Negative.   Endo/Heme/Allergies: Negative.   Psychiatric/Behavioral: Positive for depression (Stable) and substance abuse (Hx Cocaine abuse ). Negative for suicidal ideas, hallucinations and memory loss. The patient has insomnia (Stable). The patient is not nervous/anxious.     Blood pressure 118/79, pulse 68, temperature 98 F (36.7 C), temperature source Oral, resp. rate 20, height 6\' 8"  (2.032 m), weight 173.728 kg (383 lb).Body mass index is 42.07 kg/(m^2).  See Md's SRA   Have you used  any form of tobacco in the last 30 days? (Cigarettes, Smokeless Tobacco, Cigars, and/or Pipes): Yes  Has this patient used any form of tobacco in the last 30 days? (Cigarettes, Smokeless Tobacco, Cigars, and/or Pipes): Yes, provided with nicotine patch for smoking cessation.  Past Medical History:  Past Medical History  Diagnosis Date  . Coronary artery disease   . Hypertension    History reviewed. No pertinent past surgical history.  Family History: History reviewed. No pertinent family history.  Social History:  History  Alcohol Use No     History  Drug Use  . Yes  . Special: Cocaine, Marijuana    History   Social History  . Marital Status: Single    Spouse Name: N/A  . Number of Children: N/A  . Years of Education: N/A   Social History Main Topics  . Smoking status: Current Every Day Smoker -- 0.50 packs/day  . Smokeless tobacco: Never Used  . Alcohol Use: No  . Drug Use: Yes    Special: Cocaine, Marijuana  . Sexual Activity: Not on file   Other Topics Concern  . None   Social History Narrative   Risk to Self: Is patient at risk for suicide?: Yes Risk to Others: No Prior Inpatient Therapy: Yes Prior Outpatient Therapy: Yes  Level of Care:  OP  Hospital Course:  39 Y/o male who states that his life has been ina down spiral. States he has been doing everything he can to stay sane,  keep himself safe. Has a baby daughter 49 M/O baby. Her mother has "drained him, manipulated him, accused him". States that he told her if he was not to get help he  was going to be dead before his birthday. Has 3 boys in [place]. States he lived with this lady, she left, while pregnant with his child and married someone else from another country. States she told him she made a mistake, wanted to get back with him, but she continues to be with the other guy. No alcohol cocaine every now and then.  Dionis was admitted to the hospital with his UDS test reports showing positive Cocaine. He  did admit having been using cocaine here & there during his admission assessment. However, his reason for admission was worsening symptoms of depression requiring mood stabilization treatment. After evaluation of his symptoms, Ryland was started on medication regimen for his presenting symptoms. His medication regimen included; Wellbutrin XL 300 mg daily for depression, Hydroxyzine 50 mg for anxiety, Nicotine patch 21 mg for smoking cessation & Seroquel 100 mg for mood control. He was also enrolled & participated in the group counseling sessions being offered and held on this unit, he learned coping skills that should help him cope better and maintain mood stability after discharge. He presented no other significant pre-existing health issues that required treatment and or monitoring. Ashford tolerated his treatment regimen without any significant adverse effects and or reactions reported.  Couper's symptoms were evaluated on daily basis by a clinical provider to assure his symptoms are responding well to his treatment regimen. And they were. This is evidenced by his reports of decreasing symptoms, improved mood, sleep, appetite and presentation of good affect. He is currently being discharged to continue psychiatric [facility] and [facility] He is provided with all the necessary information required to make this appointment without problems.   On this day of his hospital discharge, Hiroyuki is in much improved condition than upon admission. Kamilo contracted for his safety and felt more in control of his mood. His symptoms were reported as significantly decreased or resolved completely. The patient denies any SIHI & voiced no AVH. He is instructed & motivated to continue taking medications with a goal of continued improvement in mental health. The patient was provided with some sample of his [facility] discharge medications. He left [facility] in no apparent distress with all belongings. [facility]  provided patient with a bus pass.  Consults:  psychiatry  Significant Diagnostic Studies:  labs: CBC with diff, CMP, UDS, toxicology tests, U/A, results reviewed, stable  Discharge Vitals:   Blood pressure 118/79, pulse 68, temperature 98 F (36.7 C), temperature source Oral, resp. rate 20, height 6\' 8"  (2.032 m), weight 173.728 kg (383 lb). Body mass index is 42.07 kg/(m^2). Lab Results:   Results for orders placed or performed during the hospital encounter of 09/07/14 (from the past 72 hour(s))  Urine rapid drug screen (hosp performed)     Status: Abnormal   Collection Time: 09/14/14  7:02 AM  Result Value Ref Range   Opiates NONE DETECTED NONE DETECTED   Cocaine POSITIVE (A) NONE DETECTED   Benzodiazepines NONE DETECTED NONE DETECTED   Amphetamines NONE DETECTED NONE DETECTED   Tetrahydrocannabinol NONE DETECTED NONE DETECTED   Barbiturates NONE DETECTED NONE DETECTED    Comment:        DRUG SCREEN FOR MEDICAL PURPOSES ONLY.  IF CONFIRMATION IS NEEDED FOR ANY PURPOSE, NOTIFY LAB WITHIN 5 DAYS.        LOWEST DETECTABLE LIMITS FOR URINE DRUG SCREEN Drug Class       Cutoff (ng/mL) Amphetamine      1000 Barbiturate  200 Benzodiazepine   200 Tricyclics       300 Opiates          300 Cocaine          300 THC              50 Performed at [facility]     Physical Findings: AIMS: Facial and Oral Movements Muscles of Facial Expression: None, normal Lips and Perioral Area: None, normal Jaw: None, normal Tongue: None, normal,Extremity Movements Upper (arms, wrists, hands, fingers): None, normal Lower (legs, knees, ankles, toes): None, normal, Trunk Movements Neck, shoulders, hips: None, normal, Overall Severity Severity of abnormal movements (highest score from questions above): None, normal Incapacitation due to abnormal movements: None, normal Patient's awareness of abnormal movements (rate only patient's report): No Awareness, Dental  Status Current problems with teeth and/or dentures?: No Does patient usually wear dentures?: No  CIWA:  CIWA-Ar Total: 1 COWS:  COWS Total Score: 1  See Psychiatric Specialty Exam and Suicide Risk Assessment completed by Attending Physician prior to discharge.  Discharge destination:  Home  Is patient on multiple antipsychotic therapies at discharge:  No   Has Patient had three or more failed trials of antipsychotic monotherapy by history:  No  Recommended Plan for Multiple Antipsychotic Therapies: NA    Medication List    TAKE these medications      Indication   buPROPion 300 MG 24 hr tablet  Commonly known as:  WELLBUTRIN XL  Take 1 tablet (300 mg total) by mouth daily. For depression   Indication:  Major Depressive Disorder     hydrOXYzine 50 MG tablet  Commonly known as:  ATARAX/VISTARIL  Take 1 tablet (50 mg total) by mouth 3 (three) times daily as needed for anxiety.   Indication:  Anxiety     nicotine 21 mg/24hr patch  Commonly known as:  NICODERM CQ - dosed in mg/24 hours  Place 1 patch (21 mg total) onto the skin daily as needed (Nicotine Craving).   Indication:  Nicotine Addiction     QUEtiapine 100 MG tablet  Commonly known as:  SEROQUEL  Take 1 tablet (100 mg total) by mouth at bedtime. For mood control   Indication:  Mood control       Follow-up Information    Follow up with [facility] On 09/20/2014.   Why:  Therapy appointment on Wednesday August 3rd at 10am with Rolan Lipa. Please call office if you need to reschedule appointment.   Contact information:   [address] [place] [address] [phone]      Follow up with [facility] On 09/18/2014.   Why:  Assessment for medication management services on Monday August 1st at 1pm with Nyra Jabs. Please call office if you need to reschedule appointment.   Contact information:   [address],  [place], [place] [address] [phone]     Follow-up recommendations:  Activity:  As tolerated Diet: As recommended by your primary care doctor. Keep all scheduled follow-up appointments as recommended.    Comments: Take all your medications as prescribed by your mental healthcare provider. Report any adverse effects and or reactions from your medicines to your outpatient provider promptly. Patient is instructed and cautioned to not engage in alcohol and or illegal drug use while on prescription medicines. In the event of worsening symptoms, patient is instructed to call the crisis hotline, 911 and or go to the nearest ED for appropriate evaluation  and treatment of symptoms. Follow-up with your primary care provider for your other medical issues, concerns and or health care needs.   Total Discharge Time: Greater than 30 minutes  Signed: Sanjuana Kava, PMHNP, FNP-BC 09/15/2014, 4:26 PM  I personally assessed the patient and formulated the plan Madie Reno A. Dub Mikes, M.D.

## 2014-09-15 NOTE — Progress Notes (Signed)
Recreation Therapy Notes  Date: 07.29.16 Time: 9:30 am Location: [address] Group Room  Group Topic: Stress Management  Goal Area(s) Addresses:  Patient will verbalize importance of using healthy stress management.  Patient will identify positive emotions associated with healthy stress management.   Intervention: Stress Management  Activity :  Progressive Muscle Relaxation.  LRT introduced and educated patients on the technique of progressive muscle relaxation.  A script was used to deliver the technique to the patients.  Patients were asked to follow the script read a loud by the LRT to engage in practicing the stress management technique.  Education:  Stress Management, Discharge Planning.   Education Outcome: Acknowledges edcuation/In group clarification offered/Needs additional education  Clinical Observations/Feedback: Patient did not attend group.   Caroll Rancher, LRT/CTRS         Caroll Rancher A 09/15/2014 3:17 PM

## 2014-09-15 NOTE — Progress Notes (Signed)
Patient ID: Stanely Sexson, male   DOB: 05/13/1975, 39 y.o.   MRN: [id number] D: Patient denies SI/HI and auditory and visual hallucinations. Patient continues to be concerned about positive drug screen yesterday. Requested results of the urine taken last PM.  A: Patient given emotional support from RN. Patient given medications per MD orders. Call made to lab regarding drug screen results.  R: Patient remains cooperative and appropriate.  Results of screen not available. Will continue to monitor patient for safety.

## 2014-09-15 NOTE — Progress Notes (Signed)
Discharge note:  Patient received all personal belongings, prescriptions and medication samples.  Discharge instructions and follow up reviewed and patient indicated understanding.  He denies SI/HI/AVH.  Patient left ambulatory with [employer].

## 2014-09-15 NOTE — [facility] Group Notes (Signed)
[facility] LCSW Group Therapy  09/15/2014 2:31 PM  Type of Therapy:  Group Therapy  Participation Level:  Did Not Attend Summary of Progress/Problems: The topic for today was feelings about relapse. Pt discussed what relapse prevention is to them and identified triggers that they are on the path to relapse. Pt processed their feeling towards relapse and was able to relate to peers. Pt discussed coping skills that can be used for relapse prevention.      Sallee Lange 09/15/2014, 2:31 PM

## 2016-08-16 ENCOUNTER — Emergency Department (HOSPITAL_COMMUNITY)
Admission: EM | Admit: 2016-08-16 | Discharge: 2016-08-18 | Disposition: A | Discharge status: Other Acute Inpatient Hospital | Payer: Self-pay | Attending: Emergency Medicine | Admitting: Emergency Medicine

## 2016-08-16 ENCOUNTER — Encounter (HOSPITAL_COMMUNITY): Payer: Self-pay | Admitting: Emergency Medicine

## 2016-08-16 DIAGNOSIS — F1410 Cocaine abuse, uncomplicated: Secondary | ICD-10-CM | POA: Diagnosis present

## 2016-08-16 DIAGNOSIS — F332 Major depressive disorder, recurrent severe without psychotic features: Secondary | ICD-10-CM | POA: Diagnosis present

## 2016-08-16 DIAGNOSIS — F333 Major depressive disorder, recurrent, severe with psychotic symptoms: Secondary | ICD-10-CM | POA: Insufficient documentation

## 2016-08-16 DIAGNOSIS — I10 Essential (primary) hypertension: Secondary | ICD-10-CM | POA: Insufficient documentation

## 2016-08-16 DIAGNOSIS — F17200 Nicotine dependence, unspecified, uncomplicated: Secondary | ICD-10-CM | POA: Insufficient documentation

## 2016-08-16 DIAGNOSIS — R45851 Suicidal ideations: Secondary | ICD-10-CM | POA: Insufficient documentation

## 2016-08-16 DIAGNOSIS — I2510 Atherosclerotic heart disease of native coronary artery without angina pectoris: Secondary | ICD-10-CM | POA: Insufficient documentation

## 2016-08-16 LAB — CBC
HCT: 46.9 % (ref 39.0–52.0)
Hemoglobin: 15.6 g/dL (ref 13.0–17.0)
MCH: 27 pg (ref 26.0–34.0)
MCHC: 33.3 g/dL (ref 30.0–36.0)
MCV: 81.1 fL (ref 78.0–100.0)
PLATELETS: 168 10*3/uL (ref 150–400)
RBC: 5.78 MIL/uL (ref 4.22–5.81)
RDW: 15.4 % (ref 11.5–15.5)
WBC: 8.3 10*3/uL (ref 4.0–10.5)

## 2016-08-16 LAB — COMPREHENSIVE METABOLIC PANEL
ALT: 19 U/L (ref 17–63)
ANION GAP: 6 (ref 5–15)
AST: 23 U/L (ref 15–41)
Albumin: 3.9 g/dL (ref 3.5–5.0)
Alkaline Phosphatase: 46 U/L (ref 38–126)
BILIRUBIN TOTAL: 0.9 mg/dL (ref 0.3–1.2)
BUN: 12 mg/dL (ref 6–20)
CALCIUM: 8.7 mg/dL — AB (ref 8.9–10.3)
CO2: 27 mmol/L (ref 22–32)
Chloride: 106 mmol/L (ref 101–111)
Creatinine, Ser: 1.23 mg/dL (ref 0.61–1.24)
GFR calc Af Amer: >60 mL/min (ref >60)
Glucose, Bld: 92 mg/dL (ref 65–99)
POTASSIUM: 4.3 mmol/L (ref 3.5–5.1)
Sodium: 139 mmol/L (ref 135–145)
TOTAL PROTEIN: 6.8 g/dL (ref 6.5–8.1)

## 2016-08-16 LAB — RAPID URINE DRUG SCREEN, HOSP PERFORMED
Amphetamines: NOT DETECTED
Barbiturates: NOT DETECTED
Benzodiazepines: NOT DETECTED
Cocaine: POSITIVE — AB
Opiates: NOT DETECTED
Tetrahydrocannabinol: POSITIVE — AB

## 2016-08-16 LAB — ACETAMINOPHEN LEVEL: Acetaminophen (Tylenol), Serum: <10 ug/mL — ABNORMAL LOW (ref 10–30)

## 2016-08-16 LAB — ETHANOL

## 2016-08-16 LAB — SALICYLATE LEVEL: Salicylate Lvl: <7 mg/dL (ref 2.8–30.0)

## 2016-08-16 MED ORDER — IBUPROFEN 200 MG PO TABS: 600.0000 mg | ORAL_TABLET | Freq: Three times a day (TID) | ORAL | Status: DC | PRN

## 2016-08-16 MED ORDER — ZOLPIDEM TARTRATE 5 MG PO TABS: 5.0000 mg | ORAL_TABLET | Freq: Every evening | ORAL | Status: DC | PRN

## 2016-08-16 NOTE — ED Triage Notes (Signed)
Pt has multiple plans to kill himself. Left daughter a suicide note. Pt was told he could not see his daughter unless he got his life together. Attempted to kill himself 2 weeks ago and was seen in [facility] for this. Has been struggling with depression since an arm injury and loss of brother 2 years ago.  No HI. Used cocaine yesterday.

## 2016-08-16 NOTE — ED Notes (Signed)
Placed pt belongings (1 belongings bag and 1 plaid backpack) in pt belongings cabinet behind triage nurse's station. Pt denies that he has any belongings that need to be locked up with security.

## 2016-08-16 NOTE — ED Notes (Signed)
Report to include situation, background, assessment and recommendations from Janie Rambo RN. Patient sleeping, respirations regular and unlabored. Q15 minute rounds and security camera observation to continue.   

## 2016-08-16 NOTE — ED Notes (Signed)
Pt ambulatory w/o difficulty from triage 

## 2016-08-16 NOTE — ED Notes (Signed)
Hourly rounding reveals patient sleeping in room. No complaints, stable, in no acute distress. Q15 minute rounds and monitoring via Security Cameras to continue. 

## 2016-08-16 NOTE — ED Notes (Signed)
Bed: [facility]4 Expected date:  Expected time:  Means of arrival:  Comments: 
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
## 2016-08-16 NOTE — ED Notes (Signed)
Eating supper.

## 2016-08-16 NOTE — ED Notes (Signed)
Hourly rounding reveals patient in room. No complaints, stable, in no acute distress. Q15 minute rounds and monitoring via Security Cameras to continue.Snack and beverage given. 

## 2016-08-17 DIAGNOSIS — F1290 Cannabis use, unspecified, uncomplicated: Secondary | ICD-10-CM

## 2016-08-17 DIAGNOSIS — F4310 Post-traumatic stress disorder, unspecified: Secondary | ICD-10-CM

## 2016-08-17 DIAGNOSIS — S59911A Unspecified injury of right forearm, initial encounter: Secondary | ICD-10-CM

## 2016-08-17 DIAGNOSIS — R45851 Suicidal ideations: Secondary | ICD-10-CM

## 2016-08-17 DIAGNOSIS — G4700 Insomnia, unspecified: Secondary | ICD-10-CM

## 2016-08-17 DIAGNOSIS — F17210 Nicotine dependence, cigarettes, uncomplicated: Secondary | ICD-10-CM

## 2016-08-17 DIAGNOSIS — F332 Major depressive disorder, recurrent severe without psychotic features: Secondary | ICD-10-CM

## 2016-08-17 DIAGNOSIS — Z811 Family history of alcohol abuse and dependence: Secondary | ICD-10-CM

## 2016-08-17 DIAGNOSIS — F1410 Cocaine abuse, uncomplicated: Secondary | ICD-10-CM | POA: Diagnosis present

## 2016-08-17 MED ORDER — TRAZODONE HCL 100 MG PO TABS: 100.0000 mg | ORAL_TABLET | Freq: Every evening | ORAL | Status: DC | PRN

## 2016-08-17 MED ORDER — FLUOXETINE HCL 20 MG PO CAPS
20.0000 mg | ORAL_CAPSULE | Freq: Every day | ORAL | Status: DC
  Administered 2016-08-17 – 2016-08-18 (×2): 20 mg via ORAL
  Filled 2016-08-17 (×2): qty 1

## 2016-08-17 MED ORDER — HYDROXYZINE HCL 25 MG PO TABS
25.0000 mg | ORAL_TABLET | Freq: Four times a day (QID) | ORAL | Status: DC | PRN
  Administered 2016-08-17 – 2016-08-18 (×2): 25 mg via ORAL
  Filled 2016-08-17 (×2): qty 1

## 2016-08-17 NOTE — ED Notes (Signed)
Hourly rounding reveals patient sleeping in room. No complaints, stable, in no acute distress. Q15 minute rounds and monitoring via Security Cameras to continue. 

## 2016-08-17 NOTE — ED Notes (Signed)
Hourly rounding reveals patient in room. No complaints, stable, in no acute distress. Q15 minute rounds and monitoring via Security Cameras to continue. 

## 2016-08-17 NOTE — ED Provider Notes (Signed)
[facility]-EMERGENCY DEPT Provider Note   CSN: [id number] Arrival date & time: 08/16/16  1442     History   Chief Complaint Chief Complaint  Patient presents with  . Suicidal    HPI Jose Hays is a 41 y.o. male.  HPI  Pt with hx of CAD and HTN comes in with cc of depression. PT admits to cocaine abuse. He reports that over the past few days his depression has gotten worse and he has had thoughts of killing himself. He actually wrote a letter to his kids and their mother detailing why he wants to commit suicide - and the mother of his kids convinced her to get help. Pt hant taken any psych meds in months and has been coping with cocaine.  Pt denies nausea, emesis, fevers, chills, chest pains, shortness of breath, headaches, abdominal pain, uti like symptoms.   Past Medical History:  Diagnosis Date  . Coronary artery disease   . Hypertension     Patient Active Problem List   Diagnosis Date Noted  . Severe recurrent major depression without psychotic features (HCC) 09/08/2014  . Cocaine abuse with cocaine-induced mood disorder (HCC) 09/08/2014  . Mood disorder (HCC) 09/07/2014    History reviewed. No pertinent surgical history.     Home Medications    Prior to Admission medications   Medication Sig Start Date End Date Taking? Authorizing Provider  ibuprofen (ADVIL,MOTRIN) 200 MG tablet Take 200-600 mg by mouth every 6 (six) hours as needed for moderate pain.   Yes [provider]  buPROPion (WELLBUTRIN XL) 300 MG 24 hr tablet Take 1 tablet (300 mg total) by mouth daily. For depression Patient not taking: Reported on 08/16/2016 09/15/14   Armandina StammerNwoko, Agnes I, NP  hydrOXYzine (ATARAX/VISTARIL) 50 MG tablet Take 1 tablet (50 mg total) by mouth 3 (three) times daily as needed for anxiety. Patient not taking: Reported on 08/16/2016 09/15/14   Armandina StammerNwoko, Agnes I, NP  nicotine (NICODERM CQ - DOSED IN MG/24 HOURS) 21 mg/24hr patch Place 1 patch (21 mg total) onto the skin daily as  needed (Nicotine Craving). Patient not taking: Reported on 08/16/2016 09/15/14   Armandina StammerNwoko, Agnes I, NP  QUEtiapine (SEROQUEL) 100 MG tablet Take 1 tablet (100 mg total) by mouth at bedtime. For mood control Patient not taking: Reported on 08/16/2016 09/15/14   Sanjuana KavaNwoko, Agnes I, NP    Family History History reviewed. No pertinent family history.  Social History Social History  Substance Use Topics  . Smoking status: Current Every Day Smoker    Packs/day: 0.50  . Smokeless tobacco: Never Used  . Alcohol use No     Allergies   Patient has no known allergies.   Review of Systems Review of Systems  All other systems reviewed and are negative.    Physical Exam Updated Vital Signs BP 116/75   Pulse (!) 59   Temp 98.3 F (36.8 C) (Oral)   Resp 20   Ht 6' 6.5" (1.994 m)   Wt (!) 169.2 kg (373 lb)   SpO2 100%   BMI 42.56 kg/m   Physical Exam  Constitutional: He is oriented to person, place, and time. He appears well-developed.  HENT:  Head: Atraumatic.  Neck: Neck supple.  Cardiovascular: Normal rate.   Pulmonary/Chest: Effort normal.  Neurological: He is alert and oriented to person, place, and time.  Skin: Skin is warm.  Psychiatric:  Suicidal thoughts, no psychoses  Nursing note and vitals reviewed.    ED Treatments / Results  Labs (all labs ordered are listed, but only abnormal results are displayed) Labs Reviewed  COMPREHENSIVE METABOLIC PANEL - Abnormal; Notable for the following:       Result Value   Calcium 8.7 (*)    All other components within normal limits  ACETAMINOPHEN LEVEL - Abnormal; Notable for the following:    Acetaminophen (Tylenol), Serum <10 (*)    All other components within normal limits  RAPID URINE DRUG SCREEN, HOSP PERFORMED - Abnormal; Notable for the following:    Cocaine POSITIVE (*)    Tetrahydrocannabinol POSITIVE (*)    All other components within normal limits  ETHANOL  SALICYLATE LEVEL  CBC    EKG  EKG  Interpretation None       Radiology No results found.  Procedures Procedures (including critical care time)  Medications Ordered in ED Medications  ibuprofen (ADVIL,MOTRIN) tablet 600 mg (not administered)  zolpidem (AMBIEN) tablet 5 mg (not administered)     Initial Impression / Assessment and Plan / ED Course  I have reviewed the triage vital signs and the nursing notes.  Pertinent labs & imaging results that were available during my care of the patient were reviewed by me and considered in my medical decision making (see chart for details).     Pt with SI. Medically cleared for psych eval. Has cocaine abuse hx, but has no chest pain ,dib at this time.  Final Clinical Impressions(s) / ED Diagnoses   Final diagnoses:  Suicidal ideation  Severe episode of recurrent major depressive disorder, without psychotic features [facility])    New Prescriptions New Prescriptions   No medications on file     Derwood Kaplan, MD 08/17/16 [phone]

## 2016-08-17 NOTE — ED Notes (Signed)
Report to include Situation, Background, Assessment, and Recommendations received from Laronica RN. Patient alert and oriented, warm and dry, in no acute distress. Patient denies SI, HI, AVH and pain. Patient made aware of Q15 minute rounds and security cameras for their safety. Patient instructed to come to me with needs or concerns.  

## 2016-08-17 NOTE — Consult Note (Signed)
[facility] Face-to-Face Psychiatry Consult   Reason for Consult:  Suicidal ideations with suicide note Referring Physician:  EDP Patient Identification: Jose Hays MRN:  [id number] Principal Diagnosis: Major depressive disorder, recurrent severe without psychotic features [facility]) Diagnosis:   Patient Active Problem List   Diagnosis Date Noted  . Cocaine abuse [F14.10] 08/17/2016    Priority: High  . Major depressive disorder, recurrent severe without psychotic features ([place]) [F33.2] 09/08/2014    Priority: High    Total Time spent with patient: 45 minutes  Subjective:   Jose Hays is a 41 y.o. male patient admitted with suicide plan.  HPI:  41 yo male who presented to the ED after using cocaine and leaving a suicide note stating he was going to kill himself.  He was admitted at [facility] 2 weeks ago but left in 2 days because of their "unprofessional behavior", did not follow up with [facility] as recommended.  He has been using marijuana and cocaine daily.  Jose Hays is currently seeking disability for an accident two years ago where he cut his arm on the job, NO loss of movement or function.  When he was at [facility], he left AMA because he was demanding double portions and an extra mattress, not vested in treatment there.  He claims [facility] helped him in the past and wants to try again as he is suicidal.  Already requested double portions but denied due to 3 meals plus snacks already in place.    Past Psychiatric History: depression, substance abuse  Risk to Self: Suicidal Ideation: Yes-Currently Present Suicidal Intent: No-Not Currently/Within Last 6 Months Is patient at risk for suicide?: Yes Suicidal Plan?: No-Not Currently/Within Last 6 Months Access to Means: No What has been your use of drugs/alcohol within the last 12 months?: daily cocaine use and social marijuana use  How many times?: 2 Triggers for Past Attempts: Spouse contact Intentional Self Injurious Behavior: None Risk to Others:  Homicidal Ideation: No Thoughts of Harm to Others: No Current Homicidal Intent: No Current Homicidal Plan: No Access to Homicidal Means: No History of harm to others?: No Assessment of Violence: None Noted Does patient have access to weapons?: No Criminal Charges Pending?: No Does patient have a court date: No (pt has to pay a fine in Oct 2018) Prior Inpatient Therapy: Prior Inpatient Therapy: Yes Prior Therapy Dates: 2015, 2016, 2018 Prior Therapy Facilty/Provider(s): [facility], [facility] Reason for Treatment: MDD, SA, SI Prior Outpatient Therapy: Prior Outpatient Therapy: No Does patient have an ACCT team?: No Does patient have Intensive In-House Services?  : No Does patient have [facility] services? : No Does patient have P4CC services?: No  Past Medical History:  Past Medical History:  Diagnosis Date  . Coronary artery disease   . Hypertension    History reviewed. No pertinent surgical history. Family History: History reviewed. No pertinent family history. Family Psychiatric  History: none Social History:  History  Alcohol Use No     History  Drug Use  . Types: Cocaine, Marijuana    Social History   Social History  . Marital status: Single    Spouse name: N/A  . Number of children: N/A  . Years of education: N/A   Social History Main Topics  . Smoking status: Current Every Day Smoker    Packs/day: 0.50  . Smokeless tobacco: Never Used  . Alcohol use No  . Drug use: Yes    Types: Cocaine, Marijuana  . Sexual activity: Not Asked   Other Topics Concern  .  None   Social History Narrative  . None   Additional Social History:    Allergies:  No Known Allergies  Labs:  Results for orders placed or performed during the hospital encounter of 08/16/16 (from the past 48 hour(s))  Rapid urine drug screen (hospital performed)     Status: Abnormal   Collection Time: 08/16/16  3:08 PM  Result Value Ref Range   Opiates NONE DETECTED NONE DETECTED   Cocaine POSITIVE (A) NONE  DETECTED   Benzodiazepines NONE DETECTED NONE DETECTED   Amphetamines NONE DETECTED NONE DETECTED   Tetrahydrocannabinol POSITIVE (A) NONE DETECTED   Barbiturates NONE DETECTED NONE DETECTED    Comment:        DRUG SCREEN FOR MEDICAL PURPOSES ONLY.  IF CONFIRMATION IS NEEDED FOR ANY PURPOSE, NOTIFY LAB WITHIN 5 DAYS.        LOWEST DETECTABLE LIMITS FOR URINE DRUG SCREEN Drug Class       Cutoff (ng/mL) Amphetamine      1000 Barbiturate      200 Benzodiazepine   409 Tricyclics       811 Opiates          300 Cocaine          300 THC              50   Comprehensive metabolic panel     Status: Abnormal   Collection Time: 08/16/16  4:35 PM  Result Value Ref Range   Sodium 139 135 - 145 mmol/L   Potassium 4.3 3.5 - 5.1 mmol/L   Chloride 106 101 - 111 mmol/L   CO2 27 22 - 32 mmol/L   Glucose, Bld 92 65 - 99 mg/dL   BUN 12 6 - 20 mg/dL   Creatinine, Ser 1.23 0.61 - 1.24 mg/dL   Calcium 8.7 (L) 8.9 - 10.3 mg/dL   Total Protein 6.8 6.5 - 8.1 g/dL   Albumin 3.9 3.5 - 5.0 g/dL   AST 23 15 - 41 U/L   ALT 19 17 - 63 U/L   Alkaline Phosphatase 46 38 - 126 U/L   Total Bilirubin 0.9 0.3 - 1.2 mg/dL   GFR calc non Af Amer >60 >60 mL/min   GFR calc Af Amer >60 >60 mL/min    Comment: (NOTE) The eGFR has been calculated using the CKD EPI equation. This calculation has not been validated in all clinical situations. eGFR's persistently <60 mL/min signify possible Chronic Kidney Disease.    Anion gap 6 5 - 15  Ethanol     Status: None   Collection Time: 08/16/16  4:35 PM  Result Value Ref Range   Alcohol, Ethyl (B) <5 <5 mg/dL    Comment:        LOWEST DETECTABLE LIMIT FOR SERUM ALCOHOL IS 5 mg/dL FOR MEDICAL PURPOSES ONLY   Salicylate level     Status: None   Collection Time: 08/16/16  4:35 PM  Result Value Ref Range   Salicylate Lvl <9.1 2.8 - 30.0 mg/dL  Acetaminophen level     Status: Abnormal   Collection Time: 08/16/16  4:35 PM  Result Value Ref Range   Acetaminophen  (Tylenol), Serum <10 (L) 10 - 30 ug/mL    Comment:        THERAPEUTIC CONCENTRATIONS VARY SIGNIFICANTLY. A RANGE OF 10-30 ug/mL MAY BE AN EFFECTIVE CONCENTRATION FOR MANY PATIENTS. HOWEVER, SOME ARE BEST TREATED AT CONCENTRATIONS OUTSIDE THIS RANGE. ACETAMINOPHEN CONCENTRATIONS >150 ug/mL AT 4 HOURS AFTER INGESTION AND >50  ug/mL AT 12 HOURS AFTER INGESTION ARE OFTEN ASSOCIATED WITH TOXIC REACTIONS.   cbc     Status: None   Collection Time: 08/16/16  4:35 PM  Result Value Ref Range   WBC 8.3 4.0 - 10.5 K/uL   RBC 5.78 4.22 - 5.81 MIL/uL   Hemoglobin 15.6 13.0 - 17.0 g/dL   HCT 46.9 39.0 - 52.0 %   MCV 81.1 78.0 - 100.0 fL   MCH 27.0 26.0 - 34.0 pg   MCHC 33.3 30.0 - 36.0 g/dL   RDW 15.4 11.5 - 15.5 %   Platelets 168 150 - 400 K/uL    Current Facility-Administered Medications  Medication Dose Route Frequency Provider Last Rate Last Dose  . ibuprofen (ADVIL,MOTRIN) tablet 600 mg  600 mg Oral Q8H PRN Nanavati, Ankit, MD      . zolpidem (AMBIEN) tablet 5 mg  5 mg Oral QHS PRN Varney Biles, MD       Current Outpatient Prescriptions  Medication Sig Dispense Refill  . ibuprofen (ADVIL,MOTRIN) 200 MG tablet Take 200-600 mg by mouth every 6 (six) hours as needed for moderate pain.    Marland Kitchen buPROPion (WELLBUTRIN XL) 300 MG 24 hr tablet Take 1 tablet (300 mg total) by mouth daily. For depression (Patient not taking: Reported on 08/16/2016) 30 tablet 0  . hydrOXYzine (ATARAX/VISTARIL) 50 MG tablet Take 1 tablet (50 mg total) by mouth 3 (three) times daily as needed for anxiety. (Patient not taking: Reported on 08/16/2016) 45 tablet 0  . nicotine (NICODERM CQ - DOSED IN MG/24 HOURS) 21 mg/24hr patch Place 1 patch (21 mg total) onto the skin daily as needed (Nicotine Craving). (Patient not taking: Reported on 08/16/2016) 28 patch 0  . QUEtiapine (SEROQUEL) 100 MG tablet Take 1 tablet (100 mg total) by mouth at bedtime. For mood control (Patient not taking: Reported on 08/16/2016) 30 tablet 0     Musculoskeletal: Strength & Muscle Tone: within normal limits Gait & Station: normal Patient leans: N/A  Psychiatric Specialty Exam: Physical Exam  Constitutional: He is oriented to person, place, and time. He appears well-developed and well-nourished.  HENT:  Head: Normocephalic.  Neck: Normal range of motion.  Respiratory: Effort normal.  Musculoskeletal: Normal range of motion.  Neurological: He is alert and oriented to person, place, and time.  Psychiatric: His speech is normal and behavior is normal. Judgment normal. Cognition and memory are normal. He exhibits a depressed mood. He expresses suicidal ideation. He expresses suicidal plans.    Review of Systems  Psychiatric/Behavioral: Positive for depression, substance abuse and suicidal ideas.  All other systems reviewed and are negative.   Blood pressure 116/75, pulse (!) 59, temperature 98.3 F (36.8 C), temperature source Oral, resp. rate 20, height 6' 6.5" (1.994 m), weight (!) 169.2 kg (373 lb), SpO2 100 %.Body mass index is 42.56 kg/m.  General Appearance: Casual  Eye Contact:  Good  Speech:  Normal Rate  Volume:  Normal  Mood:  Depressed  Affect:  Congruent  Thought Process:  Coherent and Descriptions of Associations: Intact  Orientation:  Full (Time, Place, and Person)  Thought Content:  Rumination  Suicidal Thoughts:  Yes.  with intent/plan  Homicidal Thoughts:  No  Memory:  Immediate;   Poor Recent;   Poor Remote;   Fair  Judgement:  Fair  Insight:  Fair  Psychomotor Activity:  Decreased  Concentration:  Concentration: Fair and Attention Span: Fair  Recall:  [employer] of Knowledge:  Fair  Language:  Kermit Balo  Akathisia:  No  Handed:  Right  AIMS (if indicated):     Assets:  Leisure Time Physical Health Resilience  ADL's:  Intact  Cognition:  WNL  Sleep:        Treatment Plan Summary: Daily contact with patient to assess and evaluate symptoms and progress in treatment, Medication management  and Plan major depressive disorder, recurrent severe without psychosis:  -Crisis stabilization -Medication management:  Restart Prozac 20 mg daily for depression and Trazodone 100 mg at bedtime PRN sleep -Individual and substance abuse counseling  Disposition: Recommend psychiatric Inpatient admission when medically cleared.  Waylan Boga, NP 08/17/2016 11:27 AM  Patient seen face-to-face for psychiatric evaluation, chart reviewed and case discussed with the physician extender and developed treatment plan. Reviewed the information documented and agree with the treatment plan. Corena Pilgrim, MD

## 2016-08-17 NOTE — Progress Notes (Signed)
Per psychiatrist Jannifer FranklinAkintayo and DNP Shaune PollackLord, patient meets inpatient criteria. CSW [facility]'s [facility]: [facility].   Jose SickleKimberly Dezi Hays, LCSWA [facility]  Clinical Social Worker [phone]

## 2016-08-18 ENCOUNTER — Inpatient Hospital Stay (HOSPITAL_COMMUNITY)
Admission: AD | Admit: 2016-08-18 | Discharge: 2016-08-28 | DRG: 885 | Disposition: A | Discharge status: Home / Self Care | Payer: Federal, State, Local not specified - Other | Source: Intra-hospital | Attending: Psychiatry | Admitting: Psychiatry

## 2016-08-18 ENCOUNTER — Encounter (HOSPITAL_COMMUNITY): Payer: Self-pay | Admitting: *Deleted

## 2016-08-18 DIAGNOSIS — F332 Major depressive disorder, recurrent severe without psychotic features: Secondary | ICD-10-CM | POA: Diagnosis present

## 2016-08-18 DIAGNOSIS — T50902D Poisoning by unspecified drugs, medicaments and biological substances, intentional self-harm, subsequent encounter: Secondary | ICD-10-CM | POA: Diagnosis not present

## 2016-08-18 DIAGNOSIS — I2510 Atherosclerotic heart disease of native coronary artery without angina pectoris: Secondary | ICD-10-CM | POA: Diagnosis present

## 2016-08-18 DIAGNOSIS — F1410 Cocaine abuse, uncomplicated: Secondary | ICD-10-CM | POA: Diagnosis present

## 2016-08-18 DIAGNOSIS — Z62810 Personal history of physical and sexual abuse in childhood: Secondary | ICD-10-CM | POA: Diagnosis present

## 2016-08-18 DIAGNOSIS — I10 Essential (primary) hypertension: Secondary | ICD-10-CM | POA: Diagnosis present

## 2016-08-18 DIAGNOSIS — F17210 Nicotine dependence, cigarettes, uncomplicated: Secondary | ICD-10-CM | POA: Diagnosis present

## 2016-08-18 DIAGNOSIS — F4310 Post-traumatic stress disorder, unspecified: Secondary | ICD-10-CM | POA: Clinically undetermined

## 2016-08-18 DIAGNOSIS — G4700 Insomnia, unspecified: Secondary | ICD-10-CM | POA: Diagnosis present

## 2016-08-18 DIAGNOSIS — E7800 Pure hypercholesterolemia, unspecified: Secondary | ICD-10-CM | POA: Diagnosis present

## 2016-08-18 DIAGNOSIS — S59911A Unspecified injury of right forearm, initial encounter: Secondary | ICD-10-CM | POA: Diagnosis present

## 2016-08-18 MED ORDER — IBUPROFEN 600 MG PO TABS: 600.0000 mg | ORAL_TABLET | Freq: Three times a day (TID) | ORAL | Status: DC | PRN

## 2016-08-18 MED ORDER — MAGNESIUM HYDROXIDE 400 MG/5ML PO SUSP: 30.0000 mL | Freq: Every day | ORAL | Status: DC | PRN

## 2016-08-18 MED ORDER — NICOTINE 21 MG/24HR TD PT24
21.0000 mg | MEDICATED_PATCH | Freq: Every day | TRANSDERMAL | Status: DC
  Administered 2016-08-19 – 2016-08-28 (×10): 21 mg via TRANSDERMAL
  Filled 2016-08-18 (×13): qty 1

## 2016-08-18 MED ORDER — ACETAMINOPHEN 325 MG PO TABS: 650.0000 mg | ORAL_TABLET | Freq: Four times a day (QID) | ORAL | Status: DC | PRN

## 2016-08-18 MED ORDER — ALUM & MAG HYDROXIDE-SIMETH 200-200-20 MG/5ML PO SUSP: 30.0000 mL | ORAL | Status: DC | PRN

## 2016-08-18 MED ORDER — HYDROXYZINE HCL 25 MG PO TABS
25.0000 mg | ORAL_TABLET | Freq: Four times a day (QID) | ORAL | Status: DC | PRN
  Administered 2016-08-21 – 2016-08-28 (×8): 25 mg via ORAL
  Filled 2016-08-18 (×6): qty 1
  Filled 2016-08-18: qty 10
  Filled 2016-08-18 (×2): qty 1

## 2016-08-18 MED ORDER — FLUOXETINE HCL 20 MG PO CAPS
20.0000 mg | ORAL_CAPSULE | Freq: Every day | ORAL | Status: DC
  Filled 2016-08-18 (×2): qty 1

## 2016-08-18 MED ORDER — TRAZODONE HCL 100 MG PO TABS
100.0000 mg | ORAL_TABLET | Freq: Every evening | ORAL | Status: DC | PRN
  Administered 2016-08-21 – 2016-08-27 (×7): 100 mg via ORAL
  Filled 2016-08-18 (×3): qty 1
  Filled 2016-08-18: qty 7
  Filled 2016-08-18 (×4): qty 1
  Filled 2016-08-18 (×2): qty 7
  Filled 2016-08-18: qty 1

## 2016-08-18 NOTE — Consult Note (Signed)
[name] Face-to-Face Psychiatry Consult   Reason for Consult:  Suicidal ideations with suicide note Referring Physician:  EDP Patient Identification: Jose Hays MRN:  [id number] Principal Diagnosis: Major depressive disorder, recurrent severe without psychotic features [facility]) Diagnosis:   Patient Active Problem List   Diagnosis Date Noted  . Cocaine abuse [F14.10] 08/17/2016    Priority: High  . Major depressive disorder, recurrent severe without psychotic features ([place]) [F33.2] 09/08/2014    Priority: High    Total Time spent with patient: 30 minutes  Subjective:   Jose Hays is a 41 y.o. male patient admitted with suicide plan.  HPI:  41 yo male who presented to the ED after using cocaine and leaving a suicide note stating he was going to kill himself.  Continues to endorse suicidal ideations and plan to kill himself.  He also continues to want more food despite 3 meals and snacks.    Past Psychiatric History: depression, substance abuse  Risk to Self: Suicidal Ideation: Yes-Currently Present Suicidal Intent: No-Not Currently/Within Last 6 Months Is patient at risk for suicide?: Yes Suicidal Plan?: No-Not Currently/Within Last 6 Months Access to Means: No What has been your use of drugs/alcohol within the last 12 months?: daily cocaine use and social marijuana use  How many times?: 2 Triggers for Past Attempts: Spouse contact Intentional Self Injurious Behavior: None Risk to Others: Homicidal Ideation: No Thoughts of Harm to Others: No Current Homicidal Intent: No Current Homicidal Plan: No Access to Homicidal Means: No History of harm to others?: No Assessment of Violence: None Noted Does patient have access to weapons?: No Criminal Charges Pending?: No Does patient have a court date: No (pt has to pay a fine in Oct 2018) Prior Inpatient Therapy: Prior Inpatient Therapy: Yes Prior Therapy Dates: 2015, 2016, 2018 Prior Therapy Facilty/Provider(s): [facility], [facility] Reason for  Treatment: MDD, SA, SI Prior Outpatient Therapy: Prior Outpatient Therapy: No Does patient have an ACCT team?: No Does patient have Intensive In-House Services?  : No Does patient have [facility] services? : No Does patient have P4CC services?: No  Past Medical History:  Past Medical History:  Diagnosis Date  . Coronary artery disease   . Hypertension    History reviewed. No pertinent surgical history. Family History: History reviewed. No pertinent family history. Family Psychiatric  History: none Social History:  History  Alcohol Use No     History  Drug Use  . Types: Cocaine, Marijuana    Social History   Social History  . Marital status: Single    Spouse name: N/A  . Number of children: N/A  . Years of education: N/A   Social History Main Topics  . Smoking status: Current Every Day Smoker    Packs/day: 0.50  . Smokeless tobacco: Never Used  . Alcohol use No  . Drug use: Yes    Types: Cocaine, Marijuana  . Sexual activity: Not Asked   Other Topics Concern  . None   Social History Narrative  . None   Additional Social History:    Allergies:  No Known Allergies  Labs:  Results for orders placed or performed during the hospital encounter of 08/16/16 (from the past 48 hour(s))  Rapid urine drug screen (hospital performed)     Status: Abnormal   Collection Time: 08/16/16  3:08 PM  Result Value Ref Range   Opiates NONE DETECTED NONE DETECTED   Cocaine POSITIVE (A) NONE DETECTED   Benzodiazepines NONE DETECTED NONE DETECTED   Amphetamines NONE DETECTED NONE DETECTED  Tetrahydrocannabinol POSITIVE (A) NONE DETECTED   Barbiturates NONE DETECTED NONE DETECTED    Comment:        DRUG SCREEN FOR MEDICAL PURPOSES ONLY.  IF CONFIRMATION IS NEEDED FOR ANY PURPOSE, NOTIFY LAB WITHIN 5 DAYS.        LOWEST DETECTABLE LIMITS FOR URINE DRUG SCREEN Drug Class       Cutoff (ng/mL) Amphetamine      1000 Barbiturate      200 Benzodiazepine   009 Tricyclics        381 Opiates          300 Cocaine          300 THC              50   Comprehensive metabolic panel     Status: Abnormal   Collection Time: 08/16/16  4:35 PM  Result Value Ref Range   Sodium 139 135 - 145 mmol/L   Potassium 4.3 3.5 - 5.1 mmol/L   Chloride 106 101 - 111 mmol/L   CO2 27 22 - 32 mmol/L   Glucose, Bld 92 65 - 99 mg/dL   BUN 12 6 - 20 mg/dL   Creatinine, Ser 1.23 0.61 - 1.24 mg/dL   Calcium 8.7 (L) 8.9 - 10.3 mg/dL   Total Protein 6.8 6.5 - 8.1 g/dL   Albumin 3.9 3.5 - 5.0 g/dL   AST 23 15 - 41 U/L   ALT 19 17 - 63 U/L   Alkaline Phosphatase 46 38 - 126 U/L   Total Bilirubin 0.9 0.3 - 1.2 mg/dL   GFR calc non Af Amer >60 >60 mL/min   GFR calc Af Amer >60 >60 mL/min    Comment: (NOTE) The eGFR has been calculated using the CKD EPI equation. This calculation has not been validated in all clinical situations. eGFR's persistently <60 mL/min signify possible Chronic Kidney Disease.    Anion gap 6 5 - 15  Ethanol     Status: None   Collection Time: 08/16/16  4:35 PM  Result Value Ref Range   Alcohol, Ethyl (B) <5 <5 mg/dL    Comment:        LOWEST DETECTABLE LIMIT FOR SERUM ALCOHOL IS 5 mg/dL FOR MEDICAL PURPOSES ONLY   Salicylate level     Status: None   Collection Time: 08/16/16  4:35 PM  Result Value Ref Range   Salicylate Lvl <8.2 2.8 - 30.0 mg/dL  Acetaminophen level     Status: Abnormal   Collection Time: 08/16/16  4:35 PM  Result Value Ref Range   Acetaminophen (Tylenol), Serum <10 (L) 10 - 30 ug/mL    Comment:        THERAPEUTIC CONCENTRATIONS VARY SIGNIFICANTLY. A RANGE OF 10-30 ug/mL MAY BE AN EFFECTIVE CONCENTRATION FOR MANY PATIENTS. HOWEVER, SOME ARE BEST TREATED AT CONCENTRATIONS OUTSIDE THIS RANGE. ACETAMINOPHEN CONCENTRATIONS >150 ug/mL AT 4 HOURS AFTER INGESTION AND >50 ug/mL AT 12 HOURS AFTER INGESTION ARE OFTEN ASSOCIATED WITH TOXIC REACTIONS.   cbc     Status: None   Collection Time: 08/16/16  4:35 PM  Result Value Ref Range    WBC 8.3 4.0 - 10.5 K/uL   RBC 5.78 4.22 - 5.81 MIL/uL   Hemoglobin 15.6 13.0 - 17.0 g/dL   HCT 46.9 39.0 - 52.0 %   MCV 81.1 78.0 - 100.0 fL   MCH 27.0 26.0 - 34.0 pg   MCHC 33.3 30.0 - 36.0 g/dL   RDW 15.4 11.5 - 15.5 %  Platelets 168 150 - 400 K/uL    Current Facility-Administered Medications  Medication Dose Route Frequency Provider Last Rate Last Dose  . FLUoxetine (PROZAC) capsule 20 mg  20 mg Oral Daily Patrecia Pour, NP   20 mg at 08/18/16 1117  . hydrOXYzine (ATARAX/VISTARIL) tablet 25 mg  25 mg Oral Q6H PRN Patrecia Pour, NP   25 mg at 08/18/16 1120  . ibuprofen (ADVIL,MOTRIN) tablet 600 mg  600 mg Oral Q8H PRN Nanavati, Ankit, MD      . traZODone (DESYREL) tablet 100 mg  100 mg Oral QHS PRN Patrecia Pour, NP       Current Outpatient Prescriptions  Medication Sig Dispense Refill  . ibuprofen (ADVIL,MOTRIN) 200 MG tablet Take 200-600 mg by mouth every 6 (six) hours as needed for moderate pain.    Marland Kitchen buPROPion (WELLBUTRIN XL) 300 MG 24 hr tablet Take 1 tablet (300 mg total) by mouth daily. For depression (Patient not taking: Reported on 08/16/2016) 30 tablet 0  . hydrOXYzine (ATARAX/VISTARIL) 50 MG tablet Take 1 tablet (50 mg total) by mouth 3 (three) times daily as needed for anxiety. (Patient not taking: Reported on 08/16/2016) 45 tablet 0  . nicotine (NICODERM CQ - DOSED IN MG/24 HOURS) 21 mg/24hr patch Place 1 patch (21 mg total) onto the skin daily as needed (Nicotine Craving). (Patient not taking: Reported on 08/16/2016) 28 patch 0  . QUEtiapine (SEROQUEL) 100 MG tablet Take 1 tablet (100 mg total) by mouth at bedtime. For mood control (Patient not taking: Reported on 08/16/2016) 30 tablet 0    Musculoskeletal: Strength & Muscle Tone: within normal limits Gait & Station: normal Patient leans: N/A  Psychiatric Specialty Exam: Physical Exam  Constitutional: He is oriented to person, place, and time. He appears well-developed and well-nourished.  HENT:  Head:  Normocephalic.  Neck: Normal range of motion.  Respiratory: Effort normal.  Musculoskeletal: Normal range of motion.  Neurological: He is alert and oriented to person, place, and time.  Psychiatric: His speech is normal and behavior is normal. Judgment normal. Cognition and memory are normal. He exhibits a depressed mood. He expresses suicidal ideation. He expresses suicidal plans.    Review of Systems  Psychiatric/Behavioral: Positive for depression, substance abuse and suicidal ideas.  All other systems reviewed and are negative.   Blood pressure 111/70, pulse (!) 50, temperature 97.8 F (36.6 C), temperature source Oral, resp. rate 16, height 6' 6.5" (1.994 m), weight (!) 169.2 kg (373 lb), SpO2 98 %.Body mass index is 42.56 kg/m.  General Appearance: Casual  Eye Contact:  Good  Speech:  Normal Rate  Volume:  Normal  Mood:  Depressed  Affect:  Congruent  Thought Process:  Coherent and Descriptions of Associations: Intact  Orientation:  Full (Time, Place, and Person)  Thought Content:  Rumination  Suicidal Thoughts:  Yes.  with intent/plan  Homicidal Thoughts:  No  Memory:  Immediate;   Poor Recent;   Poor Remote;   Fair  Judgement:  Fair  Insight:  Fair  Psychomotor Activity:  Decreased  Concentration:  Concentration: Fair and Attention Span: Fair  Recall:  [employer] of Knowledge:  Fair  Language:  Good  Akathisia:  No  Handed:  Right  AIMS (if indicated):     Assets:  Leisure Time Physical Health Resilience  ADL's:  Intact  Cognition:  WNL  Sleep:        Treatment Plan Summary: Daily contact with patient to assess and evaluate symptoms  and progress in treatment, Medication management and Plan major depressive disorder, recurrent severe without psychosis:  -Crisis stabilization -Medication management:  Continued Prozac 20 mg daily for depression and Trazodone 100 mg at bedtime PRN sleep -Individual and substance abuse counseling  Disposition: Recommend  psychiatric Inpatient admission when medically cleared.  Waylan Boga, NP 08/18/2016 12:30 PM  Patient seen face-to-face for psychiatric evaluation, chart reviewed and case discussed with the physician extender and developed treatment plan. Reviewed the information documented and agree with the treatment plan. Corena Pilgrim, MD

## 2016-08-18 NOTE — ED Notes (Signed)
Pt discharged ambulatory with [facility] driver.  Pt was in no distress at discharge.  All belongings were sent with pt.

## 2016-08-18 NOTE — ED Notes (Signed)
Hourly rounding reveals patient sleeping in room. No complaints, stable, in no acute distress. Q15 minute rounds and monitoring via Security Cameras to continue. 

## 2016-08-18 NOTE — Progress Notes (Signed)
Admission Note:  41 year old male who presents, in no acute distress, for the treatment of SI and Depression. Patient appears flat and depressed. Patient was calm and cooperative with admission process. Patient presents with passive SI and contracts for safety upon admission. Patient reports that he wrote a suicide note which was found by the mother of his child.  Patient reports previous suicide attempts with the last one being "2 weeks ago" where patient reportedly cut his wrist.  Patient denies AVH.  Patient verbalizes increased depression.  Patient identifies multiple stressors over the last few years to include "relationship issues with mother of his daughter", "work injury", "brother passed away from a work injury".  Patient reports that he wrote a suicide note, prior to admission.  Patient reports cocaine use.  Patient states "I feel like I need to use cocaine on a daily bases" and states "It numbs me so I don't have to think about it".  Patient reports that he is waiting on disability.  [facility], patient would like "a clear head to be able to think properly and make good decisions", "Learn how to cope", "get a support system", "change mindset", "Finances".  Skin was assessed.  Patient has a surgical scar on right arm from a work injury, resulting in limited motion on right hand.  Patient searched and no contraband found, POC and unit policies explained and understanding verbalized. Consents obtained. Patient had no additional questions or concerns.

## 2016-08-18 NOTE — Progress Notes (Signed)
Writer introduced self to pt. Medications reviewed with pt. Pt verbalized needing tx for substance abuse on 300. Per Julieanne Cottonina, AC., no bed available on [address] hall. Order placed for pt to program on [address] hall.

## 2016-08-18 NOTE — BH Assessment (Signed)
[facility] Assessment Progress Note  Per Jose MinsMojeed Akintayo, MD, this pt requires psychiatric hospitalization at this time.  Jose Heinrichina Tate, RN, [facility] R[facility].  Pt has signed Voluntary Admission and Consent for Treatment, as well as Consent to Release Information to no one, and signed forms have been fax[facility] to [facility].  Pt's nurse, Jose Hays, has been notified, and agrees to send original paperwork along with pt via [facility], and to call report to [phone].  Jose Canninghomas Zora Glendenning, MA Triage Specialist [phone]

## 2016-08-18 NOTE — Progress Notes (Signed)
08/18/16 1357:  LRT went to pt room to offer activities, pt was sleep.  Jose Hays, LRT/CTRS 

## 2016-08-18 NOTE — Progress Notes (Signed)
Adult Psychoeducational Group Note  Date:  08/18/2016 Time:  10:40 PM  Group Topic/Focus:  Wrap-Up Group:   The focus of this group is to help patients review their daily goal of treatment and discuss progress on daily workbooks.  Participation Level:  Minimal  Participation Quality:  Appropriate, Attentive, Sharing and Supportive  Affect:  Appropriate  Cognitive:  Appropriate  Insight: Appropriate and Good  Engagement in Group:  Developing/Improving and Supportive  Modes of Intervention:  Discussion, Education, Socialization and Support  Additional Comments:  Pt shared in group his goal while he is here to work on "bettering himself" and pt stated he would like to learn "coping skills for positive self talk."  Karleen HampshireFox, Kelin Nixon Brittini 08/18/2016, 10:40 PM

## 2016-08-18 NOTE — Progress Notes (Deleted)
Subjective/Objective No new subjective & objective note has been filed under this hospital service since the last note was generated.   Scheduled Meds: . FLUoxetine  20 mg Oral Daily   Continuous Infusions: PRN Meds:hydrOXYzine, ibuprofen, traZODone  Vital signs in last 24 hours: Temp:  [97.8 F (36.6 C)-98 F (36.7 C)] 97.8 F (36.6 C) (07/02 0615) Pulse Rate:  [50-62] 50 (07/02 0615) Resp:  [16-20] 16 (07/02 0615) BP: (109-127)/(61-101) 111/70 (07/02 0615) SpO2:  [98 %-100 %] 98 % (07/02 0615)  Intake/Output last 3 shifts: No intake/output data recorded. Intake/Output this shift: No intake/output data recorded.  Problem Assessment/Plan No new Assessment & Plan notes have been filed under this hospital service since the last note was generated. Service: Psychiatry

## 2016-08-18 NOTE — Tx Team (Signed)
Initial Treatment Plan 08/18/2016 7:07 PM Garald BaldingJohn Macrae [id number]    PATIENT STRESSORS: Financial difficulties Loss of relationship Marital or family conflict Substance abuse   PATIENT STRENGTHS: Ability for insight Average or above average intelligence Capable of independent living Communication skills Motivation for treatment/growth   PATIENT IDENTIFIED PROBLEMS: At risk for suicide  Substance Abuse  "a clear head to be able to think properly and make good decisions"  "Learn how to cope"  "Get a support system"  "Change mindset"  "finances"         DISCHARGE CRITERIA:  Ability to meet basic life and health needs Improved stabilization in mood, thinking, and/or behavior Motivation to continue treatment in a less acute level of care Need for constant or close observation no longer present Verbal commitment to aftercare and medication compliance Withdrawal symptoms are absent or subacute and managed without 24-hour nursing intervention  PRELIMINARY DISCHARGE PLAN: Attend 12-step recovery group Outpatient therapy Return to previous living arrangement  PATIENT/FAMILY INVOLVEMENT: This treatment plan has been presented to and reviewed with the patient, Garald BaldingJohn Hribar.  The patient and family have been given the opportunity to ask questions and make suggestions.  Carleene OverlieMiddleton, Earlin Sweeden P, RN 08/18/2016, 7:07 PM

## 2016-08-19 ENCOUNTER — Encounter (HOSPITAL_COMMUNITY): Payer: Self-pay | Admitting: Psychiatry

## 2016-08-19 DIAGNOSIS — S59911A Unspecified injury of right forearm, initial encounter: Secondary | ICD-10-CM | POA: Diagnosis present

## 2016-08-19 DIAGNOSIS — F332 Major depressive disorder, recurrent severe without psychotic features: Principal | ICD-10-CM

## 2016-08-19 DIAGNOSIS — F1410 Cocaine abuse, uncomplicated: Secondary | ICD-10-CM | POA: Clinically undetermined

## 2016-08-19 DIAGNOSIS — F4310 Post-traumatic stress disorder, unspecified: Secondary | ICD-10-CM | POA: Clinically undetermined

## 2016-08-19 MED ORDER — QUETIAPINE FUMARATE 100 MG PO TABS
100.0000 mg | ORAL_TABLET | Freq: Every day | ORAL | Status: DC
  Administered 2016-08-19 – 2016-08-24 (×6): 100 mg via ORAL
  Filled 2016-08-19 (×10): qty 1

## 2016-08-19 MED ORDER — SERTRALINE HCL 50 MG PO TABS
50.0000 mg | ORAL_TABLET | Freq: Every day | ORAL | Status: DC
  Administered 2016-08-19 – 2016-08-23 (×5): 50 mg via ORAL
  Filled 2016-08-19 (×10): qty 1
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
## 2016-08-19 NOTE — [facility] Group Notes (Signed)
[facility] Group Notes:  (Nursing/MHT/Case Management/Adjunct)  Date:  08/19/2016  Time:  2:04 PM  Type of Therapy:  Psychoeducational Skills  Participation Level:  Active  Participation Quality:  Sharing  Affect:  Appropriate  Cognitive:  Alert  Insight:  Good  Engagement in Group:  Engaged  Modes of Intervention:  Discussion  Summary of Progress/Problems: Pt stated his goal is "100% accountability, zero excuses."   Bethann PunchesJane O Toa Mia 08/19/2016, 2:04 PM

## 2016-08-19 NOTE — Progress Notes (Signed)
Recreation Therapy Notes  Animal-Assisted Activity (AAA) Program Checklist/Progress Notes Patient Eligibility Criteria Checklist & Daily Group note for Rec TxIntervention  Date: 07.03.2018 Time: 3:00pm [address]    AAA/T Program Assumption of Risk Form signed by Patient/ or Parent Legal Guardian Yes  Patient is free of allergies or sever asthma Yes  Patient reports no fear of animals Yes  Patient reports no history of cruelty to animals Yes  Patient understands his/her participation is voluntary Yes  Patient washes hands before animal contact Yes  Patient washes hands after animal contact Yes  Behavioral Response: Appropriate   Education:Hand Washing, Appropriate Animal Interaction   Education Outcome: Acknowledges education.   Clinical Observations/Feedback: Patient attended session and respectfully observed peer interaction with therapy dog.    Marykay Lexenise L Katiejo Gilroy, LRT/CTRS        Khalon Cansler L 08/19/2016 3:17 PM

## 2016-08-19 NOTE — [facility] Group Notes (Signed)
[facility] LCSW Group Therapy 08/19/2016 1:15 PM  Type of Therapy: Group Therapy- Feelings about Diagnosis  Participation Level: Active   Participation Quality:  Appropriate  Affect:  Appropriate  Cognitive: Alert and Oriented   Insight:  Developing   Engagement in Therapy: Developing/Improving and Engaged   Modes of Intervention: Clarification, Confrontation, Discussion, Education, Exploration, Limit-setting, Orientation, Problem-solving, Rapport Building, Dance movement psychotherapisteality Testing, Socialization and Support  Description of Group:   This group will allow patients to explore their thoughts and feelings about diagnoses they have received. Patients will be guided to explore their level of understanding and acceptance of these diagnoses. Facilitator will encourage patients to process their thoughts and feelings about the reactions of others to their diagnosis, and will guide patients in identifying ways to discuss their diagnosis with significant others in their lives. This group will be process-oriented, with patients participating in exploration of their own experiences as well as giving and receiving support and challenge from other group members.  Summary of Progress/Problems:  Pt states that he was relieved to receive a diagnosis because it felt good to finally be able to put a name to the symptoms that he was experiencing. Pt states that he is grateful to be in the hospital and get the help that he needs.  Therapeutic Modalities:   Cognitive Behavioral Therapy Solution Focused Therapy Motivational Interviewing Relapse Prevention Therapy  Donnelly StagerLynn Brayam Boeke, MSW, LCSW 08/19/2016 3:51 PM
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
## 2016-08-19 NOTE — Progress Notes (Signed)
Adult Psychoeducational Group Note  Date:  08/19/2016 Time:  9:35 PM  Group Topic/Focus:  Wrap-Up Group:   The focus of this group is to help patients review their daily goal of treatment and discuss progress on daily workbooks.  Participation Level:  Active  Participation Quality:  Appropriate  Affect:  Appropriate  Cognitive:  Alert  Insight: Appropriate  Engagement in Group:  Engaged  Modes of Intervention:  Discussion  Additional Comments:  Patient stated having a great day. Patient's goal for today was to attend all groups.   Jose Hays Syrianna Schillaci 08/19/2016, 9:35 PM
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
## 2016-08-19 NOTE — Progress Notes (Signed)
DAR Note Pt has been present in the milieu, attended group and actively participated. Pt sated is goal is to get all the help he can to better himself. Pt has been pleasant and calm while interacting with peers and staff. Pt Prozac medication withheld as pt stated he some reaction the last time he took it. As self inventory, pt had a fair night sleep, fair appetite, low energy and good concentration. Rate depression at 6 hopelessness at 4, and anxiety at 6. Pt's safety ensured with 15 minute and environmental checks. Pt currently denies SI/HI and A/V hallucinations. Pt verbally agrees to seek staff if SI/HI or A/VH occurs and to consult with staff before acting on these thoughts. Will continue POC.

## 2016-08-19 NOTE — Progress Notes (Signed)
[name]. Jose Hays had been up and visible in milieu this evening, spoke about how he was thankful to be here and that he came here because there were things that he needed to work on. He spoke about how he beats himself up over things and spoke about being here in the past but not taking the program seriously and spoke about focusing on getting the most out of the program this time. Rayansh did not receive or request any medications, did mention having chronic pain in right arm but says that he tries to deal with it and did not need any medication for pain or sleep. A. Support and encouragement provided. R. Safety maintained, will continue to monitor.

## 2016-08-20 ENCOUNTER — Encounter (HOSPITAL_COMMUNITY): Payer: Self-pay | Admitting: Behavioral Health

## 2016-08-20 DIAGNOSIS — Z811 Family history of alcohol abuse and dependence: Secondary | ICD-10-CM

## 2016-08-20 DIAGNOSIS — F17210 Nicotine dependence, cigarettes, uncomplicated: Secondary | ICD-10-CM

## 2016-08-20 DIAGNOSIS — E7800 Pure hypercholesterolemia, unspecified: Secondary | ICD-10-CM

## 2016-08-20 DIAGNOSIS — F39 Unspecified mood [affective] disorder: Secondary | ICD-10-CM

## 2016-08-20 DIAGNOSIS — F1290 Cannabis use, unspecified, uncomplicated: Secondary | ICD-10-CM

## 2016-08-20 DIAGNOSIS — G4700 Insomnia, unspecified: Secondary | ICD-10-CM

## 2016-08-20 DIAGNOSIS — F419 Anxiety disorder, unspecified: Secondary | ICD-10-CM

## 2016-08-20 LAB — LIPID PANEL
CHOL/HDL RATIO: 5 ratio
CHOLESTEROL: 204 mg/dL — AB (ref 0–200)
HDL: 41 mg/dL (ref >40)
LDL Cholesterol: 147 mg/dL — ABNORMAL HIGH (ref 0–99)
TRIGLYCERIDES: 80 mg/dL (ref <150)
VLDL: 16 mg/dL (ref 0–40)

## 2016-08-20 LAB — TSH: TSH: 2.53 u[IU]/mL (ref 0.350–4.500)

## 2016-08-20 MED ORDER — OMEGA-3-ACID ETHYL ESTERS 1 G PO CAPS
1.0000 g | ORAL_CAPSULE | Freq: Two times a day (BID) | ORAL | Status: DC
  Administered 2016-08-20 – 2016-08-28 (×18): 1 g via ORAL
  Filled 2016-08-20 (×5): qty 1
  Filled 2016-08-20: qty 14
  Filled 2016-08-20 (×12): qty 1
  Filled 2016-08-20: qty 14
  Filled 2016-08-20 (×4): qty 1

## 2016-08-20 NOTE — Progress Notes (Signed)
Recreation Therapy Notes  Date: 08/20/16 Time: 0930 Location: 300 Hall Group Room  Group Topic: Stress Management  Goal Area(s) Addresses:  Patient will verbalize importance of using healthy stress management.  Patient will identify positive emotions associated with healthy stress management.   Intervention: Stress Management  Activity :  Meditation.  LRT introduced the stress management technique of meditation.  LRT played meditation from the Calm app which focused on gratitude.  Patients were to follow along as the meditation played in order to fully engage in the activity.  Education:  Stress Management, Discharge Planning.   Education Outcome: Acknowledges edcuation/In group clarification offered/Needs additional education  Clinical Observations/Feedback:  Pt did not attend group.   Mykira Hofmeister, LRT/CTRS         Phila Shoaf A 08/20/2016 12:02 PM 

## 2016-08-20 NOTE — Progress Notes (Signed)
D: Pt denies SI/HI/AVH. Pt is pleasant and cooperative. Pt stated he had a rough start to his day but it got better. Pt was seen in the dayroom interacting with peers for a little while this evening.   A: Pt was offered support and encouragement. Pt was given scheduled medications. Pt was encourage to attend groups. Q 15 minute checks were done for safety.    R:Pt attends groups and interacts well with peers and staff. Pt is taking medication. Pt has no complaints.Pt receptive to treatment and safety maintained on unit.

## 2016-08-20 NOTE — Progress Notes (Signed)
D: Pt denies SI/HI/AVH. Pt is pleasant and cooperative. Pt stated he had a good day, feeling better, like he is improving. Pt stated he did not want to leave until he felt comfortable about his plan on D/C with possible re-hab  A: Pt was offered support and encouragement. Pt was given scheduled medications. Pt was encourage to attend groups. Q 15 minute checks were done for safety.   R: Pt is taking medication. Pt has no complaints.Pt receptive to treatment and safety maintained on unit.

## 2016-08-20 NOTE — Tx Team (Signed)
Interdisciplinary Treatment and Diagnostic Plan Update  08/20/2016 Time of Session: 9:30am Jose Hays MRN: [id number]  Principal Diagnosis: Major depressive disorder, recurrent severe without psychotic features ([place])  Secondary Diagnoses: Principal Problem:   Major depressive disorder, recurrent severe without psychotic features ([place]) Active Problems:   Cocaine use disorder, mild, abuse   PTSD (post-traumatic stress disorder)   Right forearm injury   Current Medications:  Current Facility-Administered Medications  Medication Dose Route Frequency Provider Last Rate Last Dose  . acetaminophen (TYLENOL) tablet 650 mg  650 mg Oral Q6H PRN Patrecia Pour, NP      . alum & mag hydroxide-simeth (MAALOX/MYLANTA) 200-200-20 MG/5ML suspension 30 mL  30 mL Oral Q4H PRN Patrecia Pour, NP      . hydrOXYzine (ATARAX/VISTARIL) tablet 25 mg  25 mg Oral Q6H PRN Patrecia Pour, NP      . ibuprofen (ADVIL,MOTRIN) tablet 600 mg  600 mg Oral Q8H PRN Patrecia Pour, NP      . magnesium hydroxide (MILK OF MAGNESIA) suspension 30 mL  30 mL Oral Daily PRN Patrecia Pour, NP      . nicotine (NICODERM CQ - dosed in mg/24 hours) patch 21 mg  21 mg Transdermal Daily Eappen, Saramma, MD   21 mg at 08/20/16 0800  . omega-3 acid ethyl esters (LOVAZA) capsule 1 g  1 g Oral BID Mordecai Maes, NP   1 g at 08/20/16 1209  . QUEtiapine (SEROQUEL) tablet 100 mg  100 mg Oral QHS Mordecai Maes, NP   100 mg at 08/19/16 2205  . sertraline (ZOLOFT) tablet 50 mg  50 mg Oral Daily Mordecai Maes, NP   50 mg at 08/20/16 0807  . traZODone (DESYREL) tablet 100 mg  100 mg Oral QHS PRN Patrecia Pour, NP        PTA Medications: Prescriptions Prior to Admission  Medication Sig Dispense Refill Last Dose  . buPROPion (WELLBUTRIN XL) 300 MG 24 hr tablet Take 1 tablet (300 mg total) by mouth daily. For depression (Patient not taking: Reported on 08/16/2016) 30 tablet 0 Completed Course at Unknown time  . hydrOXYzine  (ATARAX/VISTARIL) 50 MG tablet Take 1 tablet (50 mg total) by mouth 3 (three) times daily as needed for anxiety. (Patient not taking: Reported on 08/16/2016) 45 tablet 0 Completed Course at Unknown time  . ibuprofen (ADVIL,MOTRIN) 200 MG tablet Take 200-600 mg by mouth every 6 (six) hours as needed for moderate pain.   Past Month at Unknown time  . nicotine (NICODERM CQ - DOSED IN MG/24 HOURS) 21 mg/24hr patch Place 1 patch (21 mg total) onto the skin daily as needed (Nicotine Craving). (Patient not taking: Reported on 08/16/2016) 28 patch 0 Completed Course at Unknown time  . QUEtiapine (SEROQUEL) 100 MG tablet Take 1 tablet (100 mg total) by mouth at bedtime. For mood control (Patient not taking: Reported on 08/16/2016) 30 tablet 0 Completed Course at Unknown time    Treatment Modalities: Medication Management, Group therapy, Case management,  1 to 1 session with clinician, Psychoeducation, Recreational therapy.  Patient Stressors: Financial difficulties Loss of relationship Marital or family conflict Substance abuse  Patient Strengths: Ability for insight Average or above average intelligence Capable of independent living Agricultural engineer for treatment/growth  Physician Treatment Plan for Primary Diagnosis: Major depressive disorder, recurrent severe without psychotic features ([place]) Long Term Goal(s): Improvement in symptoms so as ready for discharge  Short Term Goals: Ability to identify changes in lifestyle to reduce recurrence  of condition will improve Ability to verbalize feelings will improve Compliance with prescribed medications will improve Ability to disclose and discuss suicidal ideas Ability to identify and develop effective coping behaviors will improve  Medication Management: Evaluate patient's response, side effects, and tolerance of medication regimen.  Therapeutic Interventions: 1 to 1 sessions, Unit Group sessions and Medication  administration.  Evaluation of Outcomes: Not Met  Physician Treatment Plan for Secondary Diagnosis: Principal Problem:   Major depressive disorder, recurrent severe without psychotic features ([place]) Active Problems:   Cocaine use disorder, mild, abuse   PTSD (post-traumatic stress disorder)   Right forearm injury   Long Term Goal(s): Improvement in symptoms so as ready for discharge  Short Term Goals: Ability to identify changes in lifestyle to reduce recurrence of condition will improve Ability to verbalize feelings will improve Compliance with prescribed medications will improve Ability to disclose and discuss suicidal ideas Ability to identify and develop effective coping behaviors will improve  Medication Management: Evaluate patient's response, side effects, and tolerance of medication regimen.  Therapeutic Interventions: 1 to 1 sessions, Unit Group sessions and Medication administration.  Evaluation of Outcomes: Not Met   RN Treatment Plan for Primary Diagnosis: Major depressive disorder, recurrent severe without psychotic features ([place]) Long Term Goal(s): Knowledge of disease and therapeutic regimen to maintain health will improve  Short Term Goals: Ability to verbalize feelings will improve, Ability to disclose and discuss suicidal ideas and Ability to identify and develop effective coping behaviors will improve  Medication Management: RN will administer medications as ordered by provider, will assess and evaluate patient's response and provide education to patient for prescribed medication. RN will report any adverse and/or side effects to prescribing provider.  Therapeutic Interventions: 1 on 1 counseling sessions, Psychoeducation, Medication administration, Evaluate responses to treatment, Monitor vital signs and CBGs as ordered, Perform/monitor CIWA, COWS, AIMS and Fall Risk screenings as ordered, Perform wound care treatments as ordered.  Evaluation of Outcomes: Not  Met   LCSW Treatment Plan for Primary Diagnosis: Major depressive disorder, recurrent severe without psychotic features ([place]) Long Term Goal(s): Safe transition to appropriate next level of care at discharge, Engage patient in therapeutic group addressing interpersonal concerns.  Short Term Goals: Engage patient in aftercare planning with referrals and resources, Identify triggers associated with mental health/substance abuse issues and Increase skills for wellness and recovery  Therapeutic Interventions: Assess for all discharge needs, 1 to 1 time with Social worker, Explore available resources and support systems, Assess for adequacy in community support network, Educate family and significant other(s) on suicide prevention, Complete Psychosocial Assessment, Interpersonal group therapy.  Evaluation of Outcomes: Not Met   Progress in Treatment: Attending groups: Yes  Participating in groups: Yes Taking medication as prescribed: Yes, MD continues to assess for medication changes as needed Toleration medication: Yes, no side effects reported at this time Family/Significant other contact made: No, CSW attempting to make contact with friend Patient understands diagnosis: Continuing to assess Discussing patient identified problems/goals with staff: Yes Medical problems stabilized or resolved: Yes Denies suicidal/homicidal ideation: Yes Issues/concerns per patient self-inventory: None Other: N/A  New problem(s) identified: None identified at this time.   New Short Term/Long Term Goal(s): None identified at this time.   Discharge Plan or Barriers: Pt will return home and follow-up with outpatient services at [facility]  Reason for Continuation of Hospitalization: Depression Medication stabilization Suicidal ideation  Estimated Length of Stay: 3-5 days  Attendees: Patient: 08/20/2016  12:26 PM  Physician: Dr. Merton Border, MD  08/20/2016  12:26 PM  Nursing: Susie Cassette; RN 08/20/2016  12:26  PM  RN Care Manager: Lars Pinks, RN 08/20/2016  12:26 PM  Social Worker: Adriana Reams, LCSW 08/20/2016  12:26 PM  Recreational Therapist:  08/20/2016  12:26 PM  Other: Lindell Spar, NP; Mordecai Maes, NP; Catalina Pizza, NP 08/20/2016  12:26 PM  Other:  08/20/2016  12:26 PM  Other: 08/20/2016  12:26 PM    Scribe for Treatment Team: Gladstone Lighter, LCSW 08/20/2016 12:26 PM

## 2016-08-20 NOTE — Plan of Care (Signed)
Problem: Safety: Goal: Periods of time without injury will increase Outcome: Progressing Pt safe on the unit at this time   

## 2016-08-20 NOTE — Progress Notes (Signed)
Pt rated his day a 6. Pt goal for tomorrow is to learn more about the behaviors he been experiencing and ways to cope with them.

## 2016-08-20 NOTE — [facility] Group Notes (Signed)
[facility] Planning [facility] Note   08/20/2016  8:45 AM  Participation Quality: Pt invited. Did not attend.  Jonathon JordanLynn B Penelope Fittro, MSW, LCSWA 08/20/2016 9:52 AM

## 2016-08-20 NOTE — Progress Notes (Signed)
[facility]  08/20/2016 10:13 AM Jose BaldingJohn Endsley  MRN:  [id number]  Subjective:  " Just a little fatigued this morning. I adjusted well yesterday on the unit."  Objective: Face to face evaluation completed and chart reviewed. Patient [facility] for increased depressive symptoms and suicidal thoughts, During this evaluation patient is alert and oriented x4, calm, and cooperative.  He seems more depressed today compared to yesterday. His affect is flat. He continues to endorse both depressive mood and some  anxiety. Denies SI or homicidal thoughts with plan or intent. Denies AVH and does not appear internally preoccupied. Seems well adjusted to the unit and is attending and participating in unit milieu with disruptive behaviors. Endorses good appetite and sleeping pattern. Continues to endorse the desire to receive treatment for substance abuse once discharged. He denies withdrawal symptoms at this time. He is able to contract for safety on the unit.     Principal Problem: Major depressive disorder, recurrent severe without psychotic features (HCC) Diagnosis:   Patient Active Problem List   Diagnosis Date Noted  . 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 PTSD (post-traumatic stress disorder) [F43.10] 08/19/2016  . Right forearm injury [S59.911A] 08/19/2016  . Cocaine abuse [F14.10] 08/17/2016  . Major depressive disorder, recurrent severe without psychotic features (HCC) [F33.2] 09/08/2014   Total Time spent with patient: 20 minutes  Past Psychiatric History: Depression, SI, substance abuse. As per past discharge notes, patients past medications included Prozac 20 mg po, Seroquel 100 mg po daily at bedtime, Wellbutrin XL 300 mg po daily,  Trazodone. Past outpatient services as per note with most recent was [facility], and [facility].   Past Medical History:  Past Medical History:  Diagnosis Date  . Coronary artery disease    . Hypertension    History reviewed. No pertinent surgical history. Family History:  Family History  Problem Relation Age of Onset  . Alcohol abuse Father    Family Psychiatric  History: Family history of mental illness and substance abuse Social History:  History  Alcohol Use No     History  Drug Use  . Types: Cocaine, Marijuana    Social History   Social History  . Marital status: Single    Spouse name: N/A  . Number of children: N/A  . Years of education: N/A   Social History Main Topics  . Smoking status: Current Every Day Smoker    Packs/day: 0.50  . Smokeless tobacco: Never Used  . Alcohol use No  . Drug use: Yes    Types: Cocaine, Marijuana  . Sexual activity: Not Asked   Other Topics Concern  . None   Social History Narrative  . None   Additional Social History:     Sleep: Good  Appetite:  Good  Current Medications: Current Facility-Administered Medications  Medication Dose Route Frequency Provider Last Rate Last Dose  . acetaminophen (TYLENOL) tablet 650 mg  650 mg Oral Q6H PRN Charm RingsLord, Jamison Y, NP      . alum & mag hydroxide-simeth (MAALOX/MYLANTA) 200-200-20 MG/5ML suspension 30 mL  30 mL Oral Q4H PRN Charm RingsLord, Jamison Y, NP      . hydrOXYzine (ATARAX/VISTARIL) tablet 25 mg  25 mg Oral Q6H PRN Charm RingsLord, Jamison Y, NP      . ibuprofen (ADVIL,MOTRIN) tablet 600 mg  600 mg Oral Q8H PRN Charm RingsLord, Jamison Y, NP      . magnesium hydroxide (MILK OF MAGNESIA) suspension  30 mL  30 mL Oral Daily PRN Charm Rings, NP      . nicotine (NICODERM CQ - dosed in mg/24 hours) patch 21 mg  21 mg Transdermal Daily Eappen, Saramma, MD   21 mg at 08/20/16 0800  . QUEtiapine (SEROQUEL) tablet 100 mg  100 mg Oral QHS Denzil Magnuson, NP   100 mg at 08/19/16 2205  . sertraline (ZOLOFT) tablet 50 mg  50 mg Oral Daily Denzil Magnuson, NP   50 mg at 08/20/16 0807  . traZODone (DESYREL) tablet 100 mg  100 mg Oral QHS PRN Charm Rings, NP        Lab Results:  Results for orders  placed or performed during the hospital encounter of 08/18/16 (from the past 48 hour(s))  TSH     Status: None   Collection Time: 08/20/16  6:05 AM  Result Value Ref Range   TSH 2.530 0.350 - 4.500 uIU/mL    Comment: Performed by a 3rd Generation assay with a functional sensitivity of <=0.01 uIU/mL. Performed at [facility], [address] [place], [place] [address]   Lipid panel     Status: Abnormal   Collection Time: 08/20/16  6:05 AM  Result Value Ref Range   Cholesterol 204 (H) 0 - 200 mg/dL   Triglycerides 80 <132 mg/dL   HDL 41 >44 mg/dL   Total CHOL/HDL Ratio 5.0 RATIO   VLDL 16 0 - 40 mg/dL   LDL Cholesterol 010 (H) 0 - 99 mg/dL    Comment:        Total Cholesterol/HDL:CHD Risk Coronary Heart Disease Risk Table                     Men   Women  1/2 Average Risk   3.4   3.3  Average Risk       5.0   4.4  2 X Average Risk   9.6   7.1  3 X Average Risk  23.4   11.0        Use the calculated Patient Ratio above and the CHD Risk Table to determine the patient's CHD Risk.        ATP III CLASSIFICATION (LDL):  <100     mg/dL   Optimal  272-536  mg/dL   Near or Above                    Optimal  130-159  mg/dL   Borderline  644-034  mg/dL   High  >742     mg/dL   Very High Performed at [facility], [address], [place], [place] [address]     Blood Alcohol level:  Lab Results  Component Value Date   ETH <5 08/16/2016   ETH <5 09/07/2014    Metabolic Disorder Labs: No results found for: HGBA1C, MPG No results found for: PROLACTIN Lab Results  Component Value Date   CHOL 204 (H) 08/20/2016   TRIG 80 08/20/2016   HDL 41 08/20/2016   CHOLHDL 5.0 08/20/2016   VLDL 16 08/20/2016   LDLCALC 147 (H) 08/20/2016    Physical Findings: AIMS: Facial and Oral Movements Muscles of Facial Expression: None, normal Lips and Perioral Area: None, normal Jaw: None, normal Tongue: None, normal,Extremity Movements Upper (arms, wrists, hands,  fingers): None, normal Lower (legs, knees, ankles, toes): None, normal, Trunk Movements Neck, shoulders, hips: None, normal, Overall Severity Severity of abnormal movements (highest score from questions  above): None, normal Incapacitation due to abnormal movements: None, normal Patient's awareness of abnormal movements (rate only patient's report): No Awareness, Dental Status Current problems with teeth and/or dentures?: No Does patient usually wear dentures?: No  CIWA:    COWS:     Musculoskeletal: Strength & Muscle Tone: within normal limits Gait & Station: normal Patient leans: N/A  Psychiatric Specialty Exam: Physical Exam  Nursing note and vitals reviewed. Constitutional: He is oriented to person, place, and time.  Neurological: He is alert and oriented to person, place, and time.    Review of Systems  Psychiatric/Behavioral: Positive for depression and substance abuse. Negative for hallucinations, memory loss and suicidal ideas. The patient is nervous/anxious. The patient does not have insomnia.   All other systems reviewed and are negative.   Blood pressure (!) 100/44, pulse (!) 53, temperature (!) 97.4 F (36.3 C), resp. rate 16, height 6\' 6"  (1.981 m), weight (!) 369 lb 8 oz (167.6 kg).Body mass index is 42.7 kg/m.  General Appearance: Fairly Groomed  Eye Contact:  Good  Speech:  Clear and Coherent and Normal Rate  Volume:  Normal  Mood:  Depressed  Affect:  Depressed and Flat  Thought Process:  Coherent, Goal Directed, Linear and Descriptions of Associations: Intact  Orientation:  Full (Time, Place, and Person)  Thought Content:  Logical denies AVH.No ruminations, preoccupations   Suicidal Thoughts:  No  Homicidal Thoughts:  No  Memory:  Immediate;   Fair Recent;   Fair  Judgement:  Fair  Insight:  Fair  Psychomotor Activity:  Normal  Concentration:  Concentration: Fair and Attention Span: Fair  Recall:  [employer] of Knowledge:  Fair  Language:  Good   Akathisia:  Negative  Handed:  Right  AIMS (if indicated):     Assets:  Desire for Improvement Resilience  ADL's:  Intact  Cognition:  WNL  Sleep:  Number of Hours: 6.5     Treatment Plan Summary: Daily contact with patient to assess and evaluate symptoms and progress in treatment   Medication management: Psychiatric conditions are unstable at this time. To reduce current symptoms to base line and improve the patient's overall level of functioning will continue the following treatment plan without adjustment;   Mood control: Continue Seroquel 25 mg po daily at bedtime for mood control.  Depression:  Continue  Zoloft 50 mg po daily for depression management   Anxiety: Continue Zoloft 50 mg po daily and Vistaril 25 mg po TID as needed for anxiety.  Insomnia:  Continue Trazodone 100 mg po daily at bedtime for insomnia.   Nicotine Dependence: Continue Nicoderm patch 21 mg TD for smoking cessation.  Hypercholesterolemia-Start Lovaza 1 g bid. Educated about diet and exercise. Advised to follow-up with PCP for further evaluation.   Will monitor response to medications and adjust plan as     Other:  Safety: Will continue 15 minute observation for safety checks. Patient is able to contract for safety on the unit at this time  Labs: Cholesterol 204 LDL 147. TSH normal. HgbA1c in process.   Continue to develop treatment plan to decrease risk of relapse upon discharge and to reduce the need for readmission.  Psycho-social education regarding relapse prevention and self care.  Health care follow up as needed for medical problems.  Continue to attend and participate in therapy.   Denzil Magnuson, NP 08/20/2016, 10:13 AM

## 2016-08-20 NOTE — Progress Notes (Addendum)
Patient ID: Jose BaldingJohn Hays, male   DOB: 08-13-1975, 41 y.o.   MRN: [id number]

## 2016-08-20 NOTE — Progress Notes (Signed)
Patient ID: Jose BaldingJohn Hays, male   DOB: [birth year], 41 y.o.   MRN: [id number]  D: Patient denies SI/HI and auditory and visual hallucinations. Patient concerned about making sure he has a plan in place for after dc. Patient states his mood is improved.  A: Patient given emotional support from RN. Patient given medications per MD orders. Patient encouraged to attend groups and unit activities. Patient encouraged to come to staff with any questions or concerns.  R: Patient remains cooperative and appropriate. Will continue to monitor patient for safety.

## 2016-08-21 ENCOUNTER — Encounter (HOSPITAL_COMMUNITY): Payer: Self-pay | Admitting: Behavioral Health

## 2016-08-21 LAB — HEMOGLOBIN A1C
HEMOGLOBIN A1C: 5.5 % (ref 4.8–5.6)
Mean Plasma Glucose: 111 mg/dL

## 2016-08-21 NOTE — Progress Notes (Signed)
Patient ID: Jose BaldingJohn Hays, male   DOB: October 08, 1975, 41 y.o.   MRN: [id number]  Pt currently presents with a blunted affect and guarded behavior. Pt remains in the milieu tonight, interacts minimally with peers. Pt reports to writer that "I have these episodes where I am angry, irritable and anxious." Pt reports he did not sleep well last night. Pt reports intermittent sleep with current medication regimen.   Pt provided with medications per providers orders. Pt's labs and vitals were monitored throughout the night. Pt given a 1:1 about emotional and mental status. Pt supported and encouraged to express concerns and questions. Pt educated on medications and relaxation coping skills.   Pt's safety ensured with 15 minute and environmental checks. Endorses passive SI that has decreased in frequency. No plan. Pt currently denies HI and A/V hallucinations. Pt verbally agrees to seek staff if SI worsens, HI or A/VH occurs and to consult with staff before acting on any harmful thoughts. Identified listening to music as a healthy coping skill he uses at home. Will continue POC.

## 2016-08-21 NOTE — Progress Notes (Signed)
Patient attended group and said that his day was a 7. Patient said that his day started rough and its up to a 7. He was excited to watch his peer and her kids get alone.

## 2016-08-21 NOTE — Progress Notes (Signed)
NSG 7a-7p shift:   D:  Pt. Has been isolative, guarded, and politely resistant to attending groups this shift, stating that he is very tired.  He appears sullen and depressed.  Pt's Goal today is to get better physically.  A: Support, education, and encouragement provided as needed.  Level 3 checks continued for safety.  Medications administered as ordered.  R: Pt.  minimally receptive to intervention/s, but eventually completed his self inventory.  Safety maintained.  Joaquin MusicMary Velvie Thomaston, RN

## 2016-08-21 NOTE — Progress Notes (Signed)
[facility] MD Progress Note  08/21/2016 11:13 AM Jose Hays  MRN:  [id number]  Subjective:  " Doing better today. Yesterday I wasn't doing that well thinking about my daughter. I was having some negative thoughts. I took the Seroquel last night and It didn't make me feel as fatigue.."  Objective: Face to face evaluation completed and chart reviewed. Patient admitted to [facility] for increased depressive symptoms and suicidal thoughts. During this evaluation patient is alert and oriented x4, calm, and cooperative.  He continues to present with a depressed mood yet shows some improvement. He denies SI at this time however did endorse yesterday he was having the thoughts. He is able to contract and maintain safety on the unit. He continues to endorse both depressive symptoms and anxiety with slight improvement compared to admission. He denies homicidal thoughts with plan or intent. Denies AVH and does not appear internally preoccupied. Continues to participate in unit milieu without disruptive behaviors noted or reported.  Endorses good appetite and endorses that he is not sleeping well although he has not utilized prescription for Trazodone. He denies withdrawal symptoms at this time. Endorsed some dizziness and fatigue yesterday which he thought was secondary to Seroquel however does report improvement in those symptoms. He reports compliance with medication management.     Principal Problem: Major depressive disorder, recurrent severe without psychotic features (HCC) Diagnosis:   Patient Active Problem List   Diagnosis Date Noted  . 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 PTSD (post-traumatic stress disorder) [F43.10] 08/19/2016  . Right forearm injury [S59.911A] 08/19/2016  . Cocaine abuse [F14.10] 08/17/2016  . Major depressive disorder, recurrent severe without psychotic features (HCC) [F33.2] 09/08/2014   Total Time spent with patient: 20 minutes  Past Psychiatric History:  Depression, SI, substance abuse. As per past discharge notes, patients past medications included Prozac 20 mg po, Seroquel 100 mg po daily at bedtime, Wellbutrin XL 300 mg po daily,  Trazodone. Past outpatient services as per note with most recent was [facility], [facility].   Past Medical History:  Past Medical History:  Diagnosis Date  . Coronary artery disease   . Hypertension    History reviewed. No pertinent surgical history. Family History:  Family History  Problem Relation Age of Onset  . Alcohol abuse Father    Family Psychiatric  History: Family history of mental illness and substance abuse Social History:  History  Alcohol Use No     History  Drug Use  . Types: Cocaine, Marijuana    Social History   Social History  . Marital status: Single    Spouse name: N/A  . Number of children: N/A  . Years of education: N/A   Social History Main Topics  . Smoking status: Current Every Day Smoker    Packs/day: 0.50  . Smokeless tobacco: Never Used  . Alcohol use No  . Drug use: Yes    Types: Cocaine, Marijuana  . Sexual activity: Not Asked   Other Topics Concern  . None   Social History Narrative  . None   Additional Social History:     Sleep: Good  Appetite:  Good  Current Medications: Current Facility-Administered Medications  Medication Dose Route Frequency Provider Last Rate Last Dose  . acetaminophen (TYLENOL) tablet 650 mg  650 mg Oral Q6H PRN Charm Rings, NP      . alum & mag hydroxide-simeth (MAALOX/MYLANTA) 200-200-20 MG/5ML suspension 30 mL  30 mL  Oral Q4H PRN Charm Rings, NP      . hydrOXYzine (ATARAX/VISTARIL) tablet 25 mg  25 mg Oral Q6H PRN Charm Rings, NP      . ibuprofen (ADVIL,MOTRIN) tablet 600 mg  600 mg Oral Q8H PRN Charm Rings, NP      . magnesium hydroxide (MILK OF MAGNESIA) suspension 30 mL  30 mL Oral Daily PRN Charm Rings, NP      . nicotine (NICODERM CQ - dosed in mg/24  hours) patch 21 mg  21 mg Transdermal Daily Jomarie Longs, MD   21 mg at 08/21/16 0907  . omega-3 acid ethyl esters (LOVAZA) capsule 1 g  1 g Oral BID Denzil Magnuson, NP   1 g at 08/21/16 0905  . QUEtiapine (SEROQUEL) tablet 100 mg  100 mg Oral QHS Denzil Magnuson, NP   100 mg at 08/20/16 2220  . sertraline (ZOLOFT) tablet 50 mg  50 mg Oral Daily Denzil Magnuson, NP   50 mg at 08/21/16 0905  . traZODone (DESYREL) tablet 100 mg  100 mg Oral QHS PRN Charm Rings, NP        Lab Results:  Results for orders placed or performed during the hospital encounter of 08/18/16 (from the past 48 hour(s))  TSH     Status: None   Collection Time: 08/20/16  6:05 AM  Result Value Ref Range   TSH 2.530 0.350 - 4.500 uIU/mL    Comment: Performed by a 3rd Generation assay with a functional sensitivity of <=0.01 uIU/mL. Performed at [facility], [address], [place], [place] [address]   Hemoglobin A1c     Status: None   Collection Time: 08/20/16  6:05 AM  Result Value Ref Range   Hgb A1c MFr Bld 5.5 4.8 - 5.6 %    Comment: (NOTE)         Pre-diabetes: 5.7 - 6.4         Diabetes: >6.4         Glycemic control for adults with diabetes: <7.0    Mean Plasma Glucose 111 mg/dL    Comment: (NOTE) Performed At: [facility] [address] [place], [place] [id number] Mila Homer MD [id number] Performed at [facility], [address], [place], [place] [address]   Lipid panel     Status: Abnormal   Collection Time: 08/20/16  6:05 AM  Result Value Ref Range   Cholesterol 204 (H) 0 - 200 mg/dL   Triglycerides 80 <784 mg/dL   HDL 41 >69 mg/dL   Total CHOL/HDL Ratio 5.0 RATIO   VLDL 16 0 - 40 mg/dL   LDL Cholesterol 629 (H) 0 - 99 mg/dL    Comment:        Total Cholesterol/HDL:CHD Risk Coronary Heart Disease Risk Table                     Men   Women  1/2 Average Risk   3.4   3.3  Average Risk       5.0   4.4  2 X Average Risk   9.6    7.1  3 X Average Risk  23.4   11.0        Use the calculated Patient Ratio above and the CHD Risk Table to determine the patient's CHD Risk.        ATP III CLASSIFICATION (LDL):  <100     mg/dL   Optimal  528-413  mg/dL   Near or Above                    Optimal  130-159  mg/dL   Borderline  811-914  mg/dL   High  >782     mg/dL   Very High Performed at [facility], [address] [place], [place] [address]     Blood Alcohol level:  Lab Results  Component Value Date   [facility] <5 08/16/2016   ETH <5 09/07/2014    Metabolic Disorder Labs: Lab Results  Component Value Date   HGBA1C 5.5 08/20/2016   MPG 111 08/20/2016   No results found for: PROLACTIN Lab Results  Component Value Date   CHOL 204 (H) 08/20/2016   TRIG 80 08/20/2016   HDL 41 08/20/2016   CHOLHDL 5.0 08/20/2016   VLDL 16 08/20/2016   LDLCALC 147 (H) 08/20/2016    Physical Findings: AIMS: Facial and Oral Movements Muscles of Facial Expression: None, normal Lips and Perioral Area: None, normal Jaw: None, normal Tongue: None, normal,Extremity Movements Upper (arms, wrists, hands, fingers): None, normal Lower (legs, knees, ankles, toes): None, normal, Trunk Movements Neck, shoulders, hips: None, normal, Overall Severity Severity of abnormal movements (highest score from questions above): None, normal Incapacitation due to abnormal movements: None, normal Patient's awareness of abnormal movements (rate only patient's report): No Awareness, Dental Status Current problems with teeth and/or dentures?: No Does patient usually wear dentures?: No  CIWA:    COWS:     Musculoskeletal: Strength & Muscle Tone: within normal limits Gait & Station: normal Patient leans: N/A  Psychiatric Specialty Exam: Physical Exam  Nursing note and vitals reviewed. Constitutional: He is oriented to person, place, and time.  Neurological: He is alert and oriented to person, place, and time.    Review of Systems   Psychiatric/Behavioral: Positive for depression and substance abuse. Negative for hallucinations, memory loss and suicidal ideas. The patient is nervous/anxious. The patient does not have insomnia.   All other systems reviewed and are negative.   Blood pressure 116/70, pulse 67, temperature 98.6 F (37 C), temperature source Oral, resp. rate 17, height 6\' 6"  (1.981 m), weight (!) 369 lb 8 oz (167.6 kg).Body mass index is 42.7 kg/m.  General Appearance: Fairly Groomed  Eye Contact:  Good  Speech:  Clear and Coherent and Normal Rate  Volume:  Normal  Mood:  Depressed  Affect:  Appropriate  Thought Process:  Coherent, Goal Directed, Linear and Descriptions of Associations: Intact  Orientation:  Full (Time, Place, and Person)  Thought Content:  Logical denies AVH.No ruminations, preoccupations   Suicidal Thoughts:  No  Homicidal Thoughts:  No  Memory:  Immediate;   Fair Recent;   Fair  Judgement:  Fair  Insight:  Fair  Psychomotor Activity:  Normal  Concentration:  Concentration: Fair and Attention Span: Fair  Recall:  [employer] of Knowledge:  Fair  Language:  Good  Akathisia:  Negative  Handed:  Right  AIMS (if indicated):     Assets:  Desire for Improvement Resilience  ADL's:  Intact  Cognition:  WNL  Sleep:  Number of Hours: 6.75     Treatment Plan Summary: Daily contact with patient to assess and evaluate symptoms and progress in treatment   Medication management: Patient continues to present with a depressed mood although shows improvement in affect. He endorses some intermittent SI yesterday yet denies them at current,. He is able to contract for saftey on the  unit at this time. To reduce current symptoms to base line and improve the patient's overall level of functioning will continue the following treatment plan without adjustment;   Mood control: Continue Seroquel 25 mg po daily at bedtime for mood control. Patient endorsed some fatigue and jitteriness yesterday  however endorses improvement.   Depression:  Continue  Zoloft 50 mg po daily for depression management   Anxiety: Continue Zoloft 50 mg po daily and Vistaril 25 mg po TID as needed for anxiety.  Insomnia:  Continue Trazodone 100 mg po daily at bedtime for insomnia.   Nicotine Dependence: Continue Nicoderm patch 21 mg TD for smoking cessation.  Hypercholesterolemia-continue Lovaza 1 g bid. Educated about diet and exercise. A  Will monitor response to medications and adjust plan as     Other:  Safety: Will continue 15 minute observation for safety checks. Patient is able to contract for safety on the unit at this time  Labs: HgbA1c normal. .   Continue to develop treatment plan to decrease risk of relapse upon discharge and to reduce the need for readmission.  Psycho-social education regarding relapse prevention and self care.  Health care follow up as needed for medical problems.Cholesterol 204 LDL 147.  Continue to attend and participate in therapy.   Denzil MagnusonLaShunda Ulrick Methot, NP 08/21/2016, 11:13 AM   Patient ID: Jose BaldingJohn Hays, male   DOB: 1975/11/03, 41 y.o.   MRN: [id number]

## 2016-08-21 NOTE — [facility] Group Notes (Signed)
[facility] LCSW Group Therapy 08/21/2016 1:15pm  Type of Therapy and Topic: Group Therapy: Avoiding Self-Sabotaging and Enabling Behaviors   Participation Level: Active  Description of Group:  Learn how to identify obstacles, self-sabotaging and enabling behaviors, what are they, why do we do them and what needs do these behaviors meet? Discuss unhealthy relationships and how to have positive healthy boundaries with those that sabotage and enable. Explore aspects of self-sabotage and enabling in yourself and how to limit these self-destructive behaviors in everyday life. A scaling question is used to help patient look at where they are now in their motivation to change, from 1 to 10 (lowest to highest motivation).   Therapeutic Goals:  1. Patient will identify one obstacle that relates to self-sabotage and enabling behaviors 2. Patient will identify one personal self-sabotaging or enabling behavior they did prior to admission 3. Patient able to establish a plan to change the above identified behavior they did prior to admission:  4. Patient will demonstrate ability to communicate their needs through discussion and/or role plays.  Summary of Patient Progress:  Pt states that he self-sabotages by isolating. Pt reports that he has a lot of people in his life that care about him but he has pushed them away in multiple different ways. Pt states that once he was finally ready to reach out there was no one available to help him.  Therapeutic Modalities:  Cognitive Behavioral Therapy  Person-Centered Therapy  Motivational Interviewing   Jose StagerLynn Anira Hays, MSW, [facility] 08/21/2016 3:34 PM

## 2016-08-21 NOTE — [facility] Suicide Risk Assessment (Signed)
[facility] INPATIENT:  Family/Significant Other Suicide Prevention Education  Suicide Prevention Education:  Contact Attempts:  Iran PlanasChris P (pt's friend) [phone] has been identified by the patient as the family member/significant other with whom the patient will be residing, and identified as the person(s) who will aid the patient in the event of a mental health crisis.  With written consent from the patient, two attempts were made to provide suicide prevention education, prior to and/or following the patient's discharge.  We were unsuccessful in providing suicide prevention education.  A suicide education pamphlet was given to the patient to share with family/significant other.  Date and time of first attempt: 08/21/16 at 12:45PM (voicemail left requesting call back at his earliest convenience).  Date and time of second attempt: 08/22/14 at 1:24PM   Pulte HomesHeather N Smart LCSW 08/21/2016, 1:23 PM

## 2016-08-21 NOTE — Plan of Care (Signed)
Problem: Safety: Goal: Periods of time without injury will increase Outcome: Progressing Pt safe on the unit at this time   

## 2016-08-22 NOTE — [facility] Group Notes (Signed)
[facility] LCSW Group Therapy 08/22/2016 1:15 PM  Type of Therapy: Group Therapy- Emotion Regulation  Participation Level: Pt invited. Did not attend.   Donnelly StagerLynn Korie Brabson, MSW, LCSWA 08/22/2016 2:54 PM

## 2016-08-22 NOTE — Progress Notes (Signed)
[facility] MD Progress Note  08/22/2016 12:21 PM Toluwani Yadav  MRN:  [id number]  Subjective:  "I am feeling drowsy and just woke up from the bed and getting adjusted to my medications and asks not to make any changes and feels they are slowly working for him."   Objective: Face to face evaluation completed and chart reviewed by this MD. Patient admitted to [facility] for increased depressive symptoms and suicidal thoughts.   During this evaluation patient is alert and oriented x4, calm, and cooperative.  He has been with a depressed mood and anxious. He has so so sleep and less anxious and jitters today. He has passive suicide ideation and denied acute SI at this time. He is able to contract and maintain safety on the unit. He has limited improvement in his depressive symptoms and anxiety with current medication regimen. He denies homicidal thoughts with plan or intent. Denies AVH and does not appear internally preoccupied. Continues to participate in unit milieu without disruptive behaviors noted or reported.  Endorses not sleeping well although he has not utilized prescription for Trazodone. He reports compliance with medication management.    Principal Problem: Major depressive disorder, recurrent severe without psychotic features (HCC) Diagnosis:   Patient Active Problem List   Diagnosis Date Noted  . 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 PTSD (post-traumatic stress disorder) [F43.10] 08/19/2016  . Right forearm injury [S59.911A] 08/19/2016  . Cocaine abuse [F14.10] 08/17/2016  . Major depressive disorder, recurrent severe without psychotic features (HCC) [F33.2] 09/08/2014   Total Time spent with patient: 20 minutes  Past Psychiatric History: Depression, SI, substance abuse. As per past discharge notes, patients past medications included Prozac 20 mg po, Seroquel 100 mg po daily at bedtime, Wellbutrin XL 300 mg po daily,  Trazodone. Past outpatient services as per note with most  recent was [facility], [facility], and [facility].   Past Medical History:  Past Medical History:  Diagnosis Date  . Coronary artery disease   . Hypertension    History reviewed. No pertinent surgical history. Family History:  Family History  Problem Relation Age of Onset  . Alcohol abuse Father    Family Psychiatric  History: Family history of mental illness and substance abuse Social History:  History  Alcohol Use No     History  Drug Use  . Types: Cocaine, Marijuana    Social History   Social History  . Marital status: Single    Spouse name: N/A  . Number of children: N/A  . Years of education: N/A   Social History Main Topics  . Smoking status: Current Every Day Smoker    Packs/day: 0.50  . Smokeless tobacco: Never Used  . Alcohol use No  . Drug use: Yes    Types: Cocaine, Marijuana  . Sexual activity: Not Asked   Other Topics Concern  . None   Social History Narrative  . None   Additional Social History:     Sleep: Good  Appetite:  Good  Current Medications: Current Facility-Administered Medications  Medication Dose Route Frequency Provider Last Rate Last Dose  . acetaminophen (TYLENOL) tablet 650 mg  650 mg Oral Q6H PRN Charm Rings, NP      . alum & mag hydroxide-simeth (MAALOX/MYLANTA) 200-200-20 MG/5ML suspension 30 mL  30 mL Oral Q4H PRN Charm Rings, NP      . hydrOXYzine (ATARAX/VISTARIL) tablet 25 mg  25 mg Oral Q6H PRN Charm Rings,  NP   25 mg at 08/21/16 2152  . ibuprofen (ADVIL,MOTRIN) tablet 600 mg  600 mg Oral Q8H PRN Charm RingsLord, Jamison Y, NP      . magnesium hydroxide (MILK OF MAGNESIA) suspension 30 mL  30 mL Oral Daily PRN Charm RingsLord, Jamison Y, NP      . nicotine (NICODERM CQ - dosed in mg/24 hours) patch 21 mg  21 mg Transdermal Daily Eappen, Levin BaconSaramma, MD   21 mg at 08/22/16 1043  . omega-3 acid ethyl esters (LOVAZA) capsule 1 g  1 g Oral BID Denzil Magnusonhomas, Lashunda, NP   1 g at 08/22/16 1044  . QUEtiapine  (SEROQUEL) tablet 100 mg  100 mg Oral QHS Denzil Magnusonhomas, Lashunda, NP   100 mg at 08/21/16 2152  . sertraline (ZOLOFT) tablet 50 mg  50 mg Oral Daily Denzil Magnusonhomas, Lashunda, NP   50 mg at 08/22/16 1044  . traZODone (DESYREL) tablet 100 mg  100 mg Oral QHS PRN Charm RingsLord, Jamison Y, NP   100 mg at 08/21/16 2153    Lab Results:  No results found for this or any previous visit (from the past 48 hour(s)).  Blood Alcohol level:  Lab Results  Component Value Date   ETH <5 08/16/2016   ETH <5 09/07/2014    Metabolic Disorder Labs: Lab Results  Component Value Date   HGBA1C 5.5 08/20/2016   MPG 111 08/20/2016   No results found for: PROLACTIN Lab Results  Component Value Date   CHOL 204 (H) 08/20/2016   TRIG 80 08/20/2016   HDL 41 08/20/2016   CHOLHDL 5.0 08/20/2016   VLDL 16 08/20/2016   LDLCALC 147 (H) 08/20/2016    Physical Findings: AIMS: Facial and Oral Movements Muscles of Facial Expression: None, normal Lips and Perioral Area: None, normal Jaw: None, normal Tongue: None, normal,Extremity Movements Upper (arms, wrists, hands, fingers): None, normal Lower (legs, knees, ankles, toes): None, normal, Trunk Movements Neck, shoulders, hips: None, normal, Overall Severity Severity of abnormal movements (highest score from questions above): None, normal Incapacitation due to abnormal movements: None, normal Patient's awareness of abnormal movements (rate only patient's report): No Awareness, Dental Status Current problems with teeth and/or dentures?: No Does patient usually wear dentures?: No  CIWA:    COWS:     Musculoskeletal: Strength & Muscle Tone: within normal limits Gait & Station: normal Patient leans: N/A  Psychiatric Specialty Exam: Physical Exam  Nursing note and vitals reviewed. Constitutional: He is oriented to person, place, and time.  Neurological: He is alert and oriented to person, place, and time.    Review of Systems  Psychiatric/Behavioral: Positive for depression  and substance abuse. Negative for hallucinations, memory loss and suicidal ideas. The patient is nervous/anxious. The patient does not have insomnia.   All other systems reviewed and are negative.   Blood pressure 113/68, pulse 63, temperature 97.7 F (36.5 C), temperature source Oral, resp. rate 18, height 6\' 6"  (1.981 m), weight (!) 167.6 kg (369 lb 8 oz).Body mass index is 42.7 kg/m.  General Appearance: Fairly Groomed  Eye Contact:  Good  Speech:  Clear and Coherent and Normal Rate  Volume:  Normal  Mood:  Depressed  Affect:  Appropriate  Thought Process:  Coherent, Goal Directed, Linear and Descriptions of Associations: Intact  Orientation:  Full (Time, Place, and Person)  Thought Content:  Logical denies AVH.No ruminations, preoccupations   Suicidal Thoughts:  No  Homicidal Thoughts:  No  Memory:  Immediate;   Fair Recent;   Fair  Judgement:  Fair  Insight:  Fair  Psychomotor Activity:  Normal  Concentration:  Concentration: Fair and Attention Span: Fair  Recall:  [employer] of Knowledge:  Fair  Language:  Good  Akathisia:  Negative  Handed:  Right  AIMS (if indicated):     Assets:  Desire for Improvement Resilience  ADL's:  Intact  Cognition:  WNL  Sleep:  Number of Hours: 6.75     Treatment Plan Summary: Reviewed current treatment plan and patient slowly responding to the medication regimen and tolerating without severe side effects.   Daily contact with patient to assess and evaluate symptoms and progress in treatment   Medication management: Patient continues to present with a depressed mood although shows improvement in affect. He endorses some intermittent SI yesterday yet denies them at current,. He is able to contract for saftey on the unit at this time. To reduce current symptoms to base line and improve the patient's overall level of functioning will continue the following treatment plan without adjustment;   Mood control: Continue Seroquel 25 mg po daily at  bedtime for mood control. Patient endorsed some fatigue and jitteriness yesterday however endorses improvement.   Depression:  Continue  Zoloft 50 mg po daily for depression management   Anxiety: Continue Zoloft 50 mg po daily and Vistaril 25 mg po TID as needed for anxiety.  Insomnia:  Continue Trazodone 100 mg po daily at bedtime for insomnia.   Nicotine Dependence: Continue Nicoderm patch 21 mg TD for smoking cessation.  Hypercholesterolemia-continue Lovaza 1 g bid. Educated about diet and exercise. A  Will monitor response to medications and adjust plan as   Other:  Safety: Will continue 15 minute observation for safety checks. Patient is able to contract for safety on the unit at this time  Labs: HgbA1c normal. .   Continue to develop treatment plan to decrease risk of relapse upon discharge and to reduce the need for readmission.  Psycho-social education regarding relapse prevention and self care.  Health care follow up as needed for medical problems.Cholesterol 204 LDL 147.  Continue to attend and participate in therapy.   Leata Mouse, MD 08/22/2016, 12:21 PM

## 2016-08-22 NOTE — Progress Notes (Signed)
Patient ID: Jose BaldingJohn Hays, male   DOB: 03-26-1975, 41 y.o.   MRN: [id number]  Pt currently presents with a flat affect and depressed behavior. Pt reports to writer that their goal is to "try to be out of my room more." Pt states "I found myself tapping my foot today, that hasn't happened in a while." Pt reports good sleep with current medication regimen.   Pt provided with medications per providers orders. Pt's labs and vitals were monitored throughout the night. Pt given a 1:1 about emotional and mental status. Pt supported and encouraged to express concerns and questions. Pt educated on medications and CBT, needs reinforcement.   Pt's safety ensured with 15 minute and environmental checks. Endorses passive SI, only this morning. Pt currently denies HI and A/V hallucinations. Pt verbally agrees to seek staff if SI worsens, HI or A/VH occurs and to consult with staff before acting on any harmful thoughts. Will continue POC.

## 2016-08-22 NOTE — [facility] Group Notes (Signed)
Pt was involved in group provided insight for coping skills used while receiving treatment, Pt expressed he is feeling better about being here and is feeling good about encouraging others through telling [facility] story [facility].

## 2016-08-22 NOTE — Plan of Care (Signed)
Problem: Activity: Goal: Interest or engagement in activities will improve Outcome: Not Progressing Patient has not attended any unit activities, except meals.

## 2016-08-22 NOTE — Progress Notes (Signed)
Adult Psychoeducational Group Note  Date:  08/22/2016 Time:  8:51 PM  Group Topic/Focus:  Wrap-Up Group:   The focus of this group is to help patients review their daily goal of treatment and discuss progress on daily workbooks.  Participation Level:  Active  Participation Quality:  Appropriate  Affect:  Appropriate  Cognitive:  Alert  Insight: Appropriate  Engagement in Group:  Engaged  Modes of Intervention:  Discussion  Additional Comments:  Patient stated having an okay day. Patient's goal for today was to continue attending groups. Patient met goal.   Carnita Golob L Karenann Mcgrory 08/22/2016, 8:51 PM

## 2016-08-22 NOTE — Progress Notes (Signed)
Recreation Therapy Notes  Date: 08/22/16 Time: 0930 Location: 300 [address] Dayroom  Group Topic: Stress Management  Goal Area(s) Addresses:  Patient will verbalize importance of using healthy stress management.  Patient will identify positive emotions associated with healthy stress management.   Intervention: Stress Management  Activity :  Meditation.  LRT introduced the stress management technique of meditation.  LRT played a meditation from the calm app to allow patients to become centered and clear their minds and thoughts.  Patients were to follow along as the meditation played to fully engage in the activity.  Education:  Stress Management, Discharge Planning.   Education Outcome: Acknowledges edcuation/In group clarification offered/Needs additional education  Clinical Observations/Feedback: Pt did not attend group.   Rebbie Lauricella, LRT/CTRS         Sadi Arave A 08/22/2016 12:24 PM 

## 2016-08-22 NOTE — [facility] Group Notes (Signed)
Pt attended spiritual care group on grief and loss facilitated by chaplain Burnis KingfisherMatthew Ilsa Bonello   Group opened with brief discussion and psycho-social ed around grief and loss in relationships and in relation to self - identifying life patterns, circumstances, changes that cause losses. Established group norm of speaking from own life experience. Group goal of establishing open and affirming space for members to share loss and experience with grief, normalize grief experience and provide psycho social education and grief support.    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   Related death of brother due to work accident in same workplace.    Wondered when one is "done" grieving and noticed with group he was hopeful for having some control over his grief process that feels overwhelming.  Kanan states that he has used substances as coping mechanism and living by himself he has become increasingly isolated.  Others normalized these feelings and strategized around making connections.     [facility] Chaplain Burnis KingfisherMatthew Shruti Arrey, MDiv

## 2016-08-22 NOTE — Progress Notes (Signed)
Follow up with Kelin while rounding on 400 unit.    Chaplain provided emotional support as [facility].  Reports feeling progress, stating "I feel 40% better"   Describes his using as being a barrier to reaching out for the help he knew he needed.  Chaplain affirmed Ainsley's advocacy for himself.  Byrd relates beginning to process feelings of grief around his injury and death of brother.  Feels he has been able to experience these feelings "little by little" and not feel as overwhelmed.       [facility] Chaplain Burnis KingfisherMatthew Jeremaih Klima, MDiv

## 2016-08-22 NOTE — Progress Notes (Signed)
Data. Patient denies SI/HI/AVH. Patient has spent most of the shift in bed, except for meals. His hygiene is poor. Eye contact is poor and his affect is very flat and his mood is, "Depressed." On his self assessment he reports 6/10 for depression, 3/10 for hopelessness and 7/10 for anxiety. His goal today is, "Continue groups." Action. Emotional support and encouragement offered. Education provided on medication, indications and side effect. Q 15 minute checks done for safety. Response. Safety on the unit maintained through 15 minute checks.  Medications taken as prescribed. Patient did not attend groups. Remained calm and appropriate through out shift.

## 2016-08-23 MED ORDER — SERTRALINE HCL 100 MG PO TABS
100.0000 mg | ORAL_TABLET | Freq: Every day | ORAL | Status: DC
  Administered 2016-08-24 – 2016-08-25 (×2): 100 mg via ORAL
  Filled 2016-08-23 (×4): qty 1

## 2016-08-23 NOTE — [facility] Group Notes (Signed)
[facility] Group Notes:  (Nursing/MHT/Case Management/Adjunct)  Date:  08/23/2016  Time:  2:58 PM  Type of Therapy:  Psychoeducational Skills  Participation Level:  Active  Participation Quality:  Appropriate  Affect:  Appropriate  Cognitive:  Alert, Appropriate and Oriented  Insight:  Appropriate and Improving  Engagement in Group:  Developing/Improving  Modes of Intervention:  Discussion and Education  Summary of Progress/Problems: Group focused on communication skills, pt reported he was bullied by a previous employer.  [name], Lilee Aldea M 08/23/2016, 2:58 PM

## 2016-08-23 NOTE — Progress Notes (Signed)
Writer has observed patient up in the dayroom watching tv and interacting with select peers off and on. Wrietr spoke with him and he reports that he has had a good day. He reports thoughts of SI in the am but has stayed up in the dayroom talking to peers to get his mind off of the thoughts. He reports not having thoughts since this morning. He verbally contracts if thoughts return. He attended group and is aware of his hs medications to take later on. Support given and safety maintained on unit with 15 min checks.

## 2016-08-23 NOTE — Progress Notes (Signed)
[facility] MD Progress Note  08/23/2016 4:00 PM Jose Hays  MRN:  [id number]  Subjective:  "Im still struggling with my anxiety and learning different ways to cope. It is hard for me to handle certain situations when you have a train of thought. Im learning new ways to cope even though I haven't applied all of them yet. I don't think Im ready to be on my own yet. They told me I could leave when I was down in the emergency room but didn't think I wanted to leave so I told them I would wait for a bed here. Im just trying to feel better.  Since my injury I havent been able to play and playing the drums was my identity. "   Objective: Face to face evaluation completed and chart reviewed by this MD. Patient admitted to [facility] for increased depressive symptoms and suicidal thoughts.   During this evaluation patient is alert and oriented x4, calm, and cooperative.  He has been with a depressed mood and anxious. He continues to endorse significant levels of anxiety despite being on medication. He is observed talking and engaging well with his peers however he denies any improvement at this time. He states he is not ready to live on his own, Clinical research associate took time to identify his fears and anxiety surrounding living on his own vs staying in the hospital. He has passive suicide ideation " woke up this morning and I didn't want to be here. I pondered on it for a minute and then I got up for breakfast and those thoughts went away" and denied acute SI at this time. He is able to contract and maintain safety on the unit. He has limited improvement in his depressive symptoms and anxiety with current medication regimen. He denies homicidal thoughts with plan or intent. Denies AVH and does not appear internally preoccupied. Continues to participate in unit milieu without disruptive behaviors noted or reported.  Endorses not sleeping well although he has not utilized prescription for Trazodone. He reports compliance with medication  management.    Principal Problem: Major depressive disorder, recurrent severe without psychotic features (HCC) Diagnosis:   Patient Active Problem List   Diagnosis Date Noted  . 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 PTSD (post-traumatic stress disorder) [F43.10] 08/19/2016  . Right forearm injury [S59.911A] 08/19/2016  . Cocaine abuse [F14.10] 08/17/2016  . Major depressive disorder, recurrent severe without psychotic features (HCC) [F33.2] 09/08/2014   Total Time spent with patient: 20 minutes  Past Psychiatric History: Depression, SI, substance abuse. As per past discharge notes, patients past medications included Prozac 20 mg po, Seroquel 100 mg po daily at bedtime, Wellbutrin XL 300 mg po daily,  Trazodone. Past outpatient services as per note with most recent was [facility], and [facility].   Past Medical History:  Past Medical History:  Diagnosis Date  . Coronary artery disease   . Hypertension    History reviewed. No pertinent surgical history. Family History:  Family History  Problem Relation Age of Onset  . Alcohol abuse Father    Family Psychiatric  History: Family history of mental illness and substance abuse Social History:  History  Alcohol Use No     History  Drug Use  . Types: Cocaine, Marijuana    Social History   Social History  . Marital status: Single    Spouse name: N/A  . Number of children: N/A  . Years of education:  N/A   Social History Main Topics  . Smoking status: Current Every Day Smoker    Packs/day: 0.50  . Smokeless tobacco: Never Used  . Alcohol use No  . Drug use: Yes    Types: Cocaine, Marijuana  . Sexual activity: Not Asked   Other Topics Concern  . None   Social History Narrative  . None   Additional Social History:     Sleep: Good  Appetite:  Good  Current Medications: Current Facility-Administered Medications  Medication Dose Route Frequency Provider  Last Rate Last Dose  . acetaminophen (TYLENOL) tablet 650 mg  650 mg Oral Q6H PRN Charm Rings, NP      . alum & mag hydroxide-simeth (MAALOX/MYLANTA) 200-200-20 MG/5ML suspension 30 mL  30 mL Oral Q4H PRN Charm Rings, NP      . hydrOXYzine (ATARAX/VISTARIL) tablet 25 mg  25 mg Oral Q6H PRN Charm Rings, NP   25 mg at 08/22/16 2154  . ibuprofen (ADVIL,MOTRIN) tablet 600 mg  600 mg Oral Q8H PRN Charm Rings, NP      . magnesium hydroxide (MILK OF MAGNESIA) suspension 30 mL  30 mL Oral Daily PRN Charm Rings, NP      . nicotine (NICODERM CQ - dosed in mg/24 hours) patch 21 mg  21 mg Transdermal Daily Jomarie Longs, MD   21 mg at 08/23/16 0827  . omega-3 acid ethyl esters (LOVAZA) capsule 1 g  1 g Oral BID Denzil Magnuson, NP   1 g at 08/23/16 0826  . QUEtiapine (SEROQUEL) tablet 100 mg  100 mg Oral QHS Denzil Magnuson, NP   100 mg at 08/22/16 2154  . sertraline (ZOLOFT) tablet 50 mg  50 mg Oral Daily Denzil Magnuson, NP   50 mg at 08/23/16 0826  . traZODone (DESYREL) tablet 100 mg  100 mg Oral QHS PRN Charm Rings, NP   100 mg at 08/22/16 2154    Lab Results:  No results found for this or any previous visit (from the past 48 hour(s)).  Blood Alcohol level:  Lab Results  Component Value Date   ETH <5 08/16/2016   ETH <5 09/07/2014    Metabolic Disorder Labs: Lab Results  Component Value Date   HGBA1C 5.5 08/20/2016   MPG 111 08/20/2016   No results found for: PROLACTIN Lab Results  Component Value Date   CHOL 204 (H) 08/20/2016   TRIG 80 08/20/2016   HDL 41 08/20/2016   CHOLHDL 5.0 08/20/2016   VLDL 16 08/20/2016   LDLCALC 147 (H) 08/20/2016    Physical Findings: AIMS: Facial and Oral Movements Muscles of Facial Expression: None, normal Lips and Perioral Area: None, normal Jaw: None, normal Tongue: None, normal,Extremity Movements Upper (arms, wrists, hands, fingers): None, normal Lower (legs, knees, ankles, toes): None, normal, Trunk  Movements Neck, shoulders, hips: None, normal, Overall Severity Severity of abnormal movements (highest score from questions above): None, normal Incapacitation due to abnormal movements: None, normal Patient's awareness of abnormal movements (rate only patient's report): No Awareness, Dental Status Current problems with teeth and/or dentures?: No Does patient usually wear dentures?: No  CIWA:    COWS:     Musculoskeletal: Strength & Muscle Tone: within normal limits Gait & Station: normal Patient leans: N/A  Psychiatric Specialty Exam: Physical Exam  Nursing note and vitals reviewed. Constitutional: He is oriented to person, place, and time.  Neurological: He is alert and oriented to person, place, and time.    Review  of Systems  Psychiatric/Behavioral: Positive for depression and substance abuse. Negative for hallucinations, memory loss and suicidal ideas. The patient is nervous/anxious. The patient does not have insomnia.   All other systems reviewed and are negative.   Blood pressure 106/69, pulse 60, temperature 98 F (36.7 C), temperature source Oral, resp. rate 16, height 6\' 6"  (1.981 m), weight (!) 167.6 kg (369 lb 8 oz).Body mass index is 42.7 kg/m.  General Appearance: Fairly Groomed  Eye Contact:  Minimal  Speech:  Clear and Coherent and Normal Rate  Volume:  Normal  Mood:  Depressed, Hopeless and Worthless  Affect:  Congruent, Depressed and Flat  Thought Process:  Coherent, Goal Directed, Linear and Descriptions of Associations: Intact  Orientation:  Full (Time, Place, and Person)  Thought Content:  Logical denies AVH.No ruminations, preoccupations   Suicidal Thoughts:  No  Homicidal Thoughts:  No  Memory:  Immediate;   Fair Recent;   Fair  Judgement:  Fair  Insight:  Fair  Psychomotor Activity:  Normal  Concentration:  Concentration: Fair and Attention Span: Fair  Recall:  [employer]iservFair  Fund of Knowledge:  Fair  Language:  Good  Akathisia:  Negative  Handed:   Right  AIMS (if indicated):     Assets:  Desire for Improvement Resilience  ADL's:  Intact  Cognition:  WNL  Sleep:  Number of Hours: 6.25     Treatment Plan Summary: Reviewed current treatment plan and patient slowly responding to the medication regimen and tolerating without severe side effects.   Daily contact with patient to assess and evaluate symptoms and progress in treatment   Medication management: Patient continues to present with a depressed mood although shows improvement in affect. He endorses some intermittent SI yesterday yet denies them at current,. He is able to contract for saftey on the unit at this time. To reduce current symptoms to base line and improve the patient's overall level of functioning will continue the following treatment plan without adjustment;   Mood control: Increase Seroquel 100 mg po daily at bedtime for mood control. Patient endorsed some fatigue and jitteriness yesterday however endorses improvement.   Depression:  Increase Zoloft 100 mg po daily for depression management   Anxiety: Vistaril 25 mg po TID as needed for anxiety.  Insomnia:  Continue Trazodone 100 mg po daily at bedtime for insomnia.   Nicotine Dependence: Continue Nicoderm patch 21 mg TD for smoking cessation.  Hypercholesterolemia-continue Lovaza 1 g bid. Educated about diet and exercise. A  Will monitor response to medications and adjust plan as   Other:  Safety: Will continue 15 minute observation for safety checks. Patient is able to contract for safety on the unit at this time  Labs: HgbA1c normal. .   Continue to develop treatment plan to decrease risk of relapse upon discharge and to reduce the need for readmission.  Psycho-social education regarding relapse prevention and self care.  Health care follow up as needed for medical problems.Cholesterol 204 LDL 147.  Continue to attend and participate in therapy.   Truman Haywardakia S Starkes, FNP 08/23/2016, 4:00 PM

## 2016-08-23 NOTE — Progress Notes (Signed)
Nursing Shift Notes : Pt reports he hasn't slept well and feels tired today. " I just need to chill and stay away from all the stress in my life". Pt was given a stress star. Pt is compliant with medications. Appearance is disheveled and unkempt. Encouraged pt to shower. Maintained on q 15 minute checks

## 2016-08-23 NOTE — [facility] Group Notes (Signed)
[facility] Group Notes: (Clinical Social Work)   08/23/2016      Type of Therapy:  Group Therapy   Participation Level:  Did Not Attend despite MHT prompting   Shammas MantleMareida Grossman-Orr, LCSW 08/23/2016, 12:16 PM

## 2016-08-23 NOTE — Progress Notes (Signed)
Patient attended group and said that his day was a 7.  His coping skills for today were puzzling, socializing and watch tv.

## 2016-08-24 NOTE — Progress Notes (Signed)
[facility] MD Progress Note  08/24/2016 11:41 AM Jose Hays  MRN:  [id number]  Subjective:  "Im not doing any better than yesterday. I thought I was going to see my daughter yesterday but they never showed up. She is with her mom at her the moment. I haven't spoken to her since, and I expected her to stick to her words.  I had advised her of visiting times, and it lets me down and makes me angry. It hurts that I stick to my word and she doesn't give me the same respect. Ive made a lot of changes for her in the past to include being there physically as a parent. She decided to marry someone out of the country ([place] to give him citizenship. I let that go then and I just wanted her to give me the birth of my daughter. "   Per nursing: Writer has observed patient up in the dayroom watching tv and interacting with select peers off and on. Wrietr spoke with him and he reports that he has had a good day. He reports thoughts of SI in the am but has stayed up in the dayroom talking to peers to get his mind off of the thoughts. He reports not having thoughts since this morning. He verbally contracts if thoughts return. He attended group and is aware of his hs medications to take later on. Support given and safety maintained on unit with 15 min check.   Objective: Face to face evaluation completed and chart reviewed by this MD. Patient [facility] for increased depressive symptoms and suicidal thoughts.   During this evaluation patient is alert and oriented x4, calm, and cooperative. During today's evaluation he is dishevel, flat, monotone speech and depressed mood. He continues to endorse significant levels of anxiety despite being on medication. At times he is isolating himself. It is apparent he has a lot of grief, disgust and resentment towards his child's mother and her poor decision making. He endorses being depressed and hopeless after they did not show up for visitation. He denies suicidal thoughts, and  he is able to contract and maintain safety on the unit. He has limited improvement in his depressive symptoms and anxiety with current medication regimen. He denies homicidal thoughts with plan or intent. Denies AVH and does not appear internally preoccupied. Continues to participate in unit milieu without disruptive behaviors noted or reported.  Endorses not sleeping well although he has not utilized prescription for Trazodone. He reports compliance with medication management.   Principal Problem: Major depressive disorder, recurrent severe without psychotic features (HCC)   Diagnosis:   Patient Active Problem List   Diagnosis Date Noted  . 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 PTSD (post-traumatic stress disorder) [F43.10] 08/19/2016  . Right forearm injury [S59.911A] 08/19/2016  . Cocaine abuse [F14.10] 08/17/2016  . Major depressive disorder, recurrent severe without psychotic features (HCC) [F33.2] 09/08/2014   Total Time spent with patient: 20 minutes  Past Psychiatric History: Depression, SI, substance abuse. As per past discharge notes, patients past medications included Prozac 20 mg po, Seroquel 100 mg po daily at bedtime, Wellbutrin XL 300 mg po daily,  Trazodone. Past outpatient services as per note with most recent was [facility], [facility], [facility], and [facility].   Past Medical History:  Past Medical History:  Diagnosis Date  . Coronary artery disease   . Hypertension    History reviewed. No pertinent surgical history. Family History:  Family History  Problem Relation Age of Onset  . Alcohol abuse Father    Family Psychiatric  History: Family history of mental illness and substance abuse Social History:  History  Alcohol Use No     History  Drug Use  . Types: Cocaine, Marijuana    Social History   Social History  . Marital status: Single    Spouse name: N/A  . Number of children: N/A  . Years of education: N/A   Social  History Main Topics  . Smoking status: Current Every Day Smoker    Packs/day: 0.50  . Smokeless tobacco: Never Used  . Alcohol use No  . Drug use: Yes    Types: Cocaine, Marijuana  . Sexual activity: Not Asked   Other Topics Concern  . None   Social History Narrative  . None   Additional Social History:     Sleep: Good  Appetite:  Good  Current Medications: Current Facility-Administered Medications  Medication Dose Route Frequency Provider Last Rate Last Dose  . acetaminophen (TYLENOL) tablet 650 mg  650 mg Oral Q6H PRN Charm Rings, NP      . alum & mag hydroxide-simeth (MAALOX/MYLANTA) 200-200-20 MG/5ML suspension 30 mL  30 mL Oral Q4H PRN Charm Rings, NP      . hydrOXYzine (ATARAX/VISTARIL) tablet 25 mg  25 mg Oral Q6H PRN Charm Rings, NP   25 mg at 08/23/16 2138  . ibuprofen (ADVIL,MOTRIN) tablet 600 mg  600 mg Oral Q8H PRN Charm Rings, NP      . magnesium hydroxide (MILK OF MAGNESIA) suspension 30 mL  30 mL Oral Daily PRN Charm Rings, NP      . nicotine (NICODERM CQ - dosed in mg/24 hours) patch 21 mg  21 mg Transdermal Daily Eappen, Saramma, MD   21 mg at 08/24/16 1019  . omega-3 acid ethyl esters (LOVAZA) capsule 1 g  1 g Oral BID Denzil Magnuson, NP   1 g at 08/23/16 2139  . QUEtiapine (SEROQUEL) tablet 100 mg  100 mg Oral QHS Denzil Magnuson, NP   100 mg at 08/23/16 2138  . sertraline (ZOLOFT) tablet 100 mg  100 mg Oral Daily Malachy Chamber S, FNP   100 mg at 08/24/16 1019  . traZODone (DESYREL) tablet 100 mg  100 mg Oral QHS PRN Charm Rings, NP   100 mg at 08/23/16 2138    Lab Results:  No results found for this or any previous visit (from the past 48 hour(s)).  Blood Alcohol level:  Lab Results  Component Value Date   ETH <5 08/16/2016   ETH <5 09/07/2014    Metabolic Disorder Labs: Lab Results  Component Value Date   HGBA1C 5.5 08/20/2016   MPG 111 08/20/2016   No results found for: PROLACTIN Lab Results  Component Value Date    CHOL 204 (H) 08/20/2016   TRIG 80 08/20/2016   HDL 41 08/20/2016   CHOLHDL 5.0 08/20/2016   VLDL 16 08/20/2016   LDLCALC 147 (H) 08/20/2016    Physical Findings: AIMS: Facial and Oral Movements Muscles of Facial Expression: None, normal Lips and Perioral Area: None, normal Jaw: None, normal Tongue: None, normal,Extremity Movements Upper (arms, wrists, hands, fingers): None, normal Lower (legs, knees, ankles, toes): None, normal, Trunk Movements Neck, shoulders, hips: None, normal, Overall Severity Severity of abnormal movements (highest score from questions above): None, normal Incapacitation due to abnormal movements: None, normal Patient's awareness of abnormal movements (rate only  patient's report): No Awareness, Dental Status Current problems with teeth and/or dentures?: No Does patient usually wear dentures?: No  CIWA:    COWS:     Musculoskeletal: Strength & Muscle Tone: within normal limits Gait & Station: normal Patient leans: N/A  Psychiatric Specialty Exam: Physical Exam  Nursing note and vitals reviewed. Constitutional: He is oriented to person, place, and time.  Neurological: He is alert and oriented to person, place, and time.    Review of Systems  Psychiatric/Behavioral: Positive for depression and substance abuse. Negative for hallucinations, memory loss and suicidal ideas. The patient is nervous/anxious. The patient does not have insomnia.   All other systems reviewed and are negative.   Blood pressure (!) 99/59, pulse 66, temperature 97.8 F (36.6 C), temperature source Oral, resp. rate 16, height 6\' 6"  (1.981 m), weight (!) 167.6 kg (369 lb 8 oz).Body mass index is 42.7 kg/m.  General Appearance: Fairly Groomed  Eye Contact:  Minimal  Speech:  Clear and Coherent and Normal Rate  Volume:  Normal  Mood:  Depressed, Hopeless and Worthless  Affect:  Congruent, Depressed and Flat  Thought Process:  Coherent, Goal Directed, Linear and Descriptions of  Associations: Intact  Orientation:  Full (Time, Place, and Person)  Thought Content:  Logical denies AVH.No ruminations, preoccupations   Suicidal Thoughts:  No  Homicidal Thoughts:  No  Memory:  Immediate;   Fair Recent;   Fair  Judgement:  Fair  Insight:  Fair  Psychomotor Activity:  Normal  Concentration:  Concentration: Fair and Attention Span: Fair  Recall:  [employer] of Knowledge:  Fair  Language:  Good  Akathisia:  Negative  Handed:  Right  AIMS (if indicated):     Assets:  Desire for Improvement Resilience  ADL's:  Intact  Cognition:  WNL  Sleep:  Number of Hours: 6.75   Treatment Plan Summary: Reviewed current treatment plan and patient slowly responding to the medication regimen and tolerating without severe side effects.   Daily contact with patient to assess and evaluate symptoms and progress in treatment  Medication management: Patient continues to present with a depressed mood although shows improvement in affect. He endorses some intermittent SI yesterday yet denies them at current,. He is able to contract for saftey on the unit at this time. To reduce current symptoms to base line and improve the patient's overall level of functioning will continue the following treatment plan without adjustment;   Mood control: Continue Seroquel 100 mg po daily at bedtime for mood control. Patient endorsed some fatigue and jitteriness yesterday however endorses improvement.   Depression:  Continue Zoloft 100 mg po daily for depression management   Anxiety: Vistaril 25 mg po TID as needed for anxiety.  Insomnia:  Continue Trazodone 100 mg po daily at bedtime for insomnia.   Nicotine Dependence: Continue Nicoderm patch 21 mg TD for smoking cessation.  Hypercholesterolemia-continue Lovaza 1 g bid. Educated about diet and exercise. A  Will monitor response to medications and adjust plan as   Other:  Safety: Will continue 15 minute observation for safety checks. Patient is able  to contract for safety on the unit at this time  Labs: HgbA1c normal.  Continue to develop treatment plan to decrease risk of relapse upon discharge and to reduce the need for readmission.  Psycho-social education regarding relapse prevention and self care.  Health care follow up as needed for medical problems.Cholesterol 204 LDL 147.  Continue to attend and participate in therapy.  Truman Haywardakia S Starkes, FNP 08/24/2016, 11:41 AM

## 2016-08-24 NOTE — Progress Notes (Signed)
Patient attended group and said that his day was a 5.  He stated that he was disappointed because he had no visitor.

## 2016-08-24 NOTE — Progress Notes (Signed)
D Pt is seen OOB UAL on the 400 hall today.he tolerates this fairly well. HE remains sad, depressed and flat. HE shares his relationship problems with this Clinical research associatewriter, telling his recent story about hiow his GF is keeping his daughter from seeing him and how this impacts him negatively. A He completed his daily assessment and on this he wrote he has ahd SI today but he willingly contracted to not hurt himslef, with this Clinical research associatewriter . R Safety is in place.

## 2016-08-24 NOTE — [facility] Group Notes (Signed)
[facility]:  (Clinical Social Work)   07/27/2016    1:00-2:00PM  Summary of Progress/Problems:   The main focus of today's process group was to   1)  discuss importance of adding supports to stay well once out of the hospital  2)  generate ideas about what healthy supports can be added  3)  provide support regarding patient fears about discharge  4)  give examples of educational opportunities to learn about diagnoses  An emphasis was placed on using counselor, doctor, therapy groups, 12-step groups, and problem-specific support groups to expand supports.  We also discussed being a better supporter of oneself and making self-care a priority, talking at length about how if humans do not care first and foremost for themselves, they have nothing to give to or take care of others.  The patient stated he has no supports in his life and also said that if he was not in the hospital today he would likely be at home thinking about hurting himself.  He did not respond to any of the group ideas about how to become a self-support.    Type of Therapy:  Process Group with Motivational Interviewing  Participation Level:  Active  Participation Quality:  Attentive  Affect:  Depressed and Flat  Cognitive:  Appropriate and Oriented  Insight:  Developing/Improving  Engagement in Therapy:  Engaged  Modes of Intervention:   Education, Support and Processing  Jose MantleMareida Grossman-Orr, LCSW 07/27/2016    2:17 PM

## 2016-08-25 MED ORDER — SERTRALINE HCL 50 MG PO TABS
150.0000 mg | ORAL_TABLET | Freq: Every day | ORAL | Status: DC
  Administered 2016-08-26 – 2016-08-28 (×3): 150 mg via ORAL
  Filled 2016-08-25 (×3): qty 3
  Filled 2016-08-25: qty 21
  Filled 2016-08-25: qty 3

## 2016-08-25 MED ORDER — ARIPIPRAZOLE 2 MG PO TABS
2.0000 mg | ORAL_TABLET | Freq: Every day | ORAL | Status: DC
  Administered 2016-08-26: 2 mg via ORAL
  Filled 2016-08-25 (×2): qty 1

## 2016-08-25 NOTE — Progress Notes (Signed)
Nursing Progress Note 1900-0730  D) Patient presents with angry expression but is pleasant and cooperative with Clinical research associatewriter. Writer reviewed recent medication changes with patient. Patient agreeable to current medication regimen. Patient reports he has trouble "staying asleep" and agrees to Archivistinform writer if he had trouble sleeping tonight. Patient is seen interactive in the milieu and attended group. Patient denies SI/HI/AVH or pain. Patient contracts for safety on the unit. Patient provided PRN medications for sleep.  A) Emotional support given. 1:1 interaction and active listening provided. Patient medicated as prescribed. Medications and plan of care reviewed with patient. Patient verbalized understanding without further questions. Snacks and fluids provided. Opportunities for questions or concerns presented to patient. Patient encouraged to continue to work on treatment goals. Labs, vital signs and patient behavior monitored throughout shift. Patient safety maintained with q15 min safety checks. Low fall risk precautions in place and reviewed with patient; patient verbalized understanding.  R) Patient receptive to interaction with nurse. Patient remains safe on the unit. Will continue to monitor.

## 2016-08-25 NOTE — Progress Notes (Signed)
Recreation Therapy Notes  Date: 08/25/16 Time: 0930 Location: 300 Hall Dayroom  Group Topic: Stress Management  Goal Area(s) Addresses:  Patient will verbalize importance of using healthy stress management.  Patient will identify positive emotions associated with healthy stress management.   Intervention: Stress Management  Activity :  Guided Imagery.  LRT introduced the stress management technique of guided imagery.  LRT read a script that allowed patients to take a mental vacation through a summer meadow.  Patients were to follow along as script was read to engage in the activity.  Education: Stress Management, Discharge Planning.   Education Outcome: Acknowledges edcuation/In group clarification offered/Needs additional education  Clinical Observations/Feedback: Pt did not attend group.   Caroll RancherMarjette Roni Friberg, LRT/CTRS        Lillia AbedLindsay, Jens Siems A 08/25/2016 1:25 PM

## 2016-08-25 NOTE — [facility] Group Notes (Signed)
[facility] LCSW Group Therapy  08/25/2016 1:15pm  Type of Therapy: Group Therapy   Topic: Overcoming Obstacles  Participation Level: Active  Participation Quality: Appropriate   Affect: Appropriate  Cognitive: Appropriate and Oriented  Insight: Developing/Improving and Improving  Engagement in Therapy: Improving  Modes of Intervention: Discussion, Exploration, Problem-solving and Support  Description of Group:  In this group patients will be encouraged to explore what they see as obstacles to their own wellness and recovery. They will be guided to discuss their thoughts, feelings, and behaviors related to these obstacles. The group will process together ways to cope with barriers, with attention given to specific choices patients can make. Each patient will be challenged to identify changes they are motivated to make in order to overcome their obstacles. This group will be process-oriented, with patients participating in exploration of their own experiences as well as giving and receiving support and challenge from other group members.  Summary of Patient Progress:  Pt states that he has not thought about what obstacles he will face when he lives the hospital because he has not given any thought to when he might be going home. Pt reports that he is anxious about discharging because he is still experiencing a lot of symptoms.   Therapeutic Modalities:  Cognitive Behavioral Therapy Solution Focused Therapy Motivational Interviewing Relapse Prevention Therapy  Jonathon JordanLynn B Ethel Meisenheimer, MSW, Theresia MajorsLCSWA [phone]

## 2016-08-25 NOTE — Plan of Care (Signed)
Problem: Education: Goal: Emotional status will improve Outcome: Not Progressing Patient appears irritable.  He was asked to come to the window for medications and did not want staff to awaken him.  He is irritable and short with his answers.

## 2016-08-25 NOTE — Progress Notes (Signed)
Patient ID: Jose BaldingJohn Hays, male   DOB: Feb 16, 1976, 41 y.o.   MRN: [id number] D: Patient was irritable when he came up for his medications.  When asked if he was suicidal, he states, "I don't know how I feel.  I just got up."  Patient rates his depression as a 6; hopelessness and anxiety as a 7.  Patient listed having thoughts of self harm on his self inventory.  He is sleeping and eating well; his energy level is low and his concentration is poor.  His discharge is planned for 7/10 with a follow up with [facility].  Patient has sullen affect; his mood is depressed and anxious.  Patient is attending groups during the day and participating in his treatment. A: Continue to monitor medication management and MD orders.  Safety checks continued every 15 minutes per protocol.  Offer support and encouragement as needed. R: Patient remains irritable with little initiation with staff.

## 2016-08-25 NOTE — Progress Notes (Signed)
Psychoeducational Group Note  Date:  08/25/2016 Time: 1000  Group Topic/Focus:  Wellness Toolbox:   The focus of this group is to discuss various aspects of wellness, balancing those aspects and exploring ways to increase the ability to experience wellness.  Patients will create a wellness toolbox for use upon discharge.  Participation Level: Did Not Attend  Participation Quality:  Not Applicable  Affect:  Not Applicable  Cognitive:  Not Applicable  Insight:  Not Applicable  Engagement in Group: Not Applicable  Additional Comments:  Pt was asleep and could not attend group.  Leland Raver E 08/25/2016, 3:42 PM

## 2016-08-25 NOTE — Progress Notes (Addendum)
Writer has observed patient up in the dayroom interacting appropriately with peers. Patient attended group and participated. Patient took all scheduled medications.   Patient currently denies having pain, -si/hi/a/v hall. Support and encouragement offered, safety maintained on unit, will continue to monitor with 15 min checks.

## 2016-08-25 NOTE — Tx Team (Signed)
Interdisciplinary Treatment and Diagnostic Plan Update  08/25/2016 Time of Session: 9:30am Jose Hays MRN: [id number]  Principal Diagnosis: Major depressive disorder, recurrent severe without psychotic features (HCC)  Secondary Diagnoses: Principal Problem:   Major depressive disorder, recurrent severe without psychotic features (HCC) Active Problems:   Cocaine use disorder, mild, abuse   PTSD (post-traumatic stress disorder)   Right forearm injury   Current Medications:  Current Facility-Administered Medications  Medication Dose Route Frequency Provider Last Rate Last Dose  . acetaminophen (TYLENOL) tablet 650 mg  650 mg Oral Q6H PRN Charm Rings, NP      . alum & mag hydroxide-simeth (MAALOX/MYLANTA) 200-200-20 MG/5ML suspension 30 mL  30 mL Oral Q4H PRN Charm Rings, NP      . hydrOXYzine (ATARAX/VISTARIL) tablet 25 mg  25 mg Oral Q6H PRN Charm Rings, NP   25 mg at 08/24/16 2210  . ibuprofen (ADVIL,MOTRIN) tablet 600 mg  600 mg Oral Q8H PRN Charm Rings, NP      . magnesium hydroxide (MILK OF MAGNESIA) suspension 30 mL  30 mL Oral Daily PRN Charm Rings, NP      . nicotine (NICODERM CQ - dosed in mg/24 hours) patch 21 mg  21 mg Transdermal Daily Jomarie Longs, MD   21 mg at 08/25/16 0831  . omega-3 acid ethyl esters (LOVAZA) capsule 1 g  1 g Oral BID Denzil Magnuson, NP   1 g at 08/25/16 0831  . QUEtiapine (SEROQUEL) tablet 100 mg  100 mg Oral QHS Denzil Magnuson, NP   100 mg at 08/24/16 2210  . sertraline (ZOLOFT) tablet 100 mg  100 mg Oral Daily Truman Hayward, FNP   100 mg at 08/25/16 0831  . traZODone (DESYREL) tablet 100 mg  100 mg Oral QHS PRN Charm Rings, NP   100 mg at 08/24/16 2210    PTA Medications: Prescriptions Prior to Admission  Medication Sig Dispense Refill Last Dose  . buPROPion (WELLBUTRIN XL) 300 MG 24 hr tablet Take 1 tablet (300 mg total) by mouth daily. For depression (Patient not taking: Reported on 08/16/2016) 30 tablet 0  Completed Course at Unknown time  . hydrOXYzine (ATARAX/VISTARIL) 50 MG tablet Take 1 tablet (50 mg total) by mouth 3 (three) times daily as needed for anxiety. (Patient not taking: Reported on 08/16/2016) 45 tablet 0 Completed Course at Unknown time  . ibuprofen (ADVIL,MOTRIN) 200 MG tablet Take 200-600 mg by mouth every 6 (six) hours as needed for moderate pain.   Past Month at Unknown time  . nicotine (NICODERM CQ - DOSED IN MG/24 HOURS) 21 mg/24hr patch Place 1 patch (21 mg total) onto the skin daily as needed (Nicotine Craving). (Patient not taking: Reported on 08/16/2016) 28 patch 0 Completed Course at Unknown time  . QUEtiapine (SEROQUEL) 100 MG tablet Take 1 tablet (100 mg total) by mouth at bedtime. For mood control (Patient not taking: Reported on 08/16/2016) 30 tablet 0 Completed Course at Unknown time    Treatment Modalities: Medication Management, Group therapy, Case management,  1 to 1 session with clinician, Psychoeducation, Recreational therapy.  Patient Stressors: Financial difficulties Loss of relationship Marital or family conflict Substance abuse  Patient Strengths: Ability for insight Average or above average intelligence Capable of independent living Barrister's clerk for treatment/growth  Physician Treatment Plan for Primary Diagnosis: Major depressive disorder, recurrent severe without psychotic features (HCC) Long Term Goal(s): Improvement in symptoms so as ready for discharge  Short Term Goals: Ability to  identify changes in lifestyle to reduce recurrence of condition will improve Ability to verbalize feelings will improve Compliance with prescribed medications will improve Ability to disclose and discuss suicidal ideas Ability to identify and develop effective coping behaviors will improve  Medication Management: Evaluate patient's response, side effects, and tolerance of medication regimen.  Therapeutic Interventions: 1 to 1 sessions, Unit Group  sessions and Medication administration.  Evaluation of Outcomes: Adequate for Discharge  Physician Treatment Plan for Secondary Diagnosis: Principal Problem:   Major depressive disorder, recurrent severe without psychotic features (HCC) Active Problems:   Cocaine use disorder, mild, abuse   PTSD (post-traumatic stress disorder)   Right forearm injury   Long Term Goal(s): Improvement in symptoms so as ready for discharge  Short Term Goals: Ability to identify changes in lifestyle to reduce recurrence of condition will improve Ability to verbalize feelings will improve Compliance with prescribed medications will improve Ability to disclose and discuss suicidal ideas Ability to identify and develop effective coping behaviors will improve  Medication Management: Evaluate patient's response, side effects, and tolerance of medication regimen.  Therapeutic Interventions: 1 to 1 sessions, Unit Group sessions and Medication administration.  Evaluation of Outcomes: Adequate for Discharge   RN Treatment Plan for Primary Diagnosis: Major depressive disorder, recurrent severe without psychotic features (HCC) Long Term Goal(s): Knowledge of disease and therapeutic regimen to maintain health will improve  Short Term Goals: Ability to verbalize feelings will improve, Ability to disclose and discuss suicidal ideas and Ability to identify and develop effective coping behaviors will improve  Medication Management: RN will administer medications as ordered by provider, will assess and evaluate patient's response and provide education to patient for prescribed medication. RN will report any adverse and/or side effects to prescribing provider.  Therapeutic Interventions: 1 on 1 counseling sessions, Psychoeducation, Medication administration, Evaluate responses to treatment, Monitor vital signs and CBGs as ordered, Perform/monitor CIWA, COWS, AIMS and Fall Risk screenings as ordered, Perform wound care  treatments as ordered.  Evaluation of Outcomes: Adequate for Discharge   LCSW Treatment Plan for Primary Diagnosis: Major depressive disorder, recurrent severe without psychotic features (HCC) Long Term Goal(s): Safe transition to appropriate next level of care at discharge, Engage patient in therapeutic group addressing interpersonal concerns.  Short Term Goals: Engage patient in aftercare planning with referrals and resources, Identify triggers associated with mental health/substance abuse issues and Increase skills for wellness and recovery  Therapeutic Interventions: Assess for all discharge needs, 1 to 1 time with Social worker, Explore available resources and support systems, Assess for adequacy in community support network, Educate family and significant other(s) on suicide prevention, Complete Psychosocial Assessment, Interpersonal group therapy.  Evaluation of Outcomes: Adequate for Discharge   Progress in Treatment: Attending groups: Yes  Participating in groups: Yes Taking medication as prescribed: Yes, MD continues to assess for medication changes as needed Toleration medication: Yes, no side effects reported at this time Family/Significant other contact made: No, CSW attempted contact with pt's friend Patient understands diagnosis: Yes, AEB pt's willingness to participate in treatment. Discussing patient identified problems/goals with staff: Yes Medical problems stabilized or resolved: Yes Denies suicidal/homicidal ideation: Yes Issues/concerns per patient self-inventory: None Other: N/A  New problem(s) identified: None identified at this time.   New Short Term/Long Term Goal(s): None identified at this time.   Discharge Plan or Barriers: Pt will return home and follow-up with outpatient services [facility]  Reason for Continuation of Hospitalization: Depression Medication stabilization   Estimated Length of Stay:  1 day; Pt will likely discharge tomorrow  08/26/16  Attendees: Patient: 08/25/2016  1:18 PM  Physician: Dr. Jama Flavorsobos, MD 08/25/2016  1:18 PM  Nursing: Clydie BraunKaren, RN; RandolphPatrice, RN 08/25/2016  1:18 PM  RN Care Manager: Onnie BoerJennifer Clark, RN 08/25/2016  1:18 PM  Social Worker: Donnelly StagerLynn Dominico Rod, LCSW 08/25/2016  1:18 PM  Recreational Therapist:  08/25/2016  1:18 PM  Other: Armandina StammerAgnes Nwoko, NP 08/25/2016  1:18 PM  Other:  08/25/2016  1:18 PM  Other: 08/25/2016  1:18 PM    Scribe for Treatment Team: Jonathon JordanLynn B Donnabelle Blanchard, MSW, LCSWA [phone] 08/25/2016 1:18 PM

## 2016-08-25 NOTE — Plan of Care (Signed)
Problem: Education: Goal: Verbalization of understanding the information provided will improve Outcome: Progressing Patient able to discuss medication changes with Clinical research associatewriter and verbalize understanding regarding new regimen.  Problem: Health Behavior/Discharge Planning: Goal: Compliance with treatment plan for underlying cause of condition will improve Outcome: Progressing Patient taking medications as prescribed. Patient verbalized understanding of medications.

## 2016-08-25 NOTE — Progress Notes (Signed)
[facility] MD Progress Note  08/25/2016 2:14 PM Jose Hays  MRN:  [id number]  Subjective:  Patient reports ongoing depression, anxiety. Denies suicidal ideations, states he feels safe on unit, but at present expresses apprehension about discharge planning- states " I live alone , and I feel I don't have a lot of support". Reports ongoing ruminations about trauma to his R arm, and how this has affected his livelihood and self identify. 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  Denies medication side effects.   Objective: I have discussed case with treatment team and have met with patient. As per staff, patient has presented intermittently irritable, sullen in affect. No disruptive behaviors.  Patient presents alert, attentive, reports ongoing depression and presents with constricted , but reactive affect. 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 Denies suicidal ideations at this time, but ruminates about living alone and having a limited support network. Denies medication side effects.  Behavior on unit calm and in good control, presents pleasant on approach.  Principal Problem: Major depressive disorder, recurrent severe without psychotic features ([place])   Diagnosis:   Patient Active Problem List   Diagnosis Date Noted  . 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 PTSD (post-traumatic stress disorder) [F43.10] 08/19/2016  . Right forearm injury [S59.911A] 08/19/2016  . Cocaine abuse [F14.10] 08/17/2016  . Major depressive disorder, recurrent severe without psychotic features ([place]) [F33.2] 09/08/2014   Total Time spent with patient: 20 minutes  Past Psychiatric History: Depression, SI, substance abuse. As per past discharge notes, patients past medications included Prozac 20 mg po, Seroquel 100 mg po daily at bedtime, Wellbutrin XL 300 mg po daily,  Trazodone. Past  outpatient services as per note with most recent was [facility], [place], [facility], and [facility].   Past Medical History:  Past Medical History:  Diagnosis Date  . Coronary artery disease   . Hypertension    History reviewed. No pertinent surgical history. Family History:  Family History  Problem Relation Age of Onset  . Alcohol abuse Father    Family Psychiatric  History: Family history of mental illness and substance abuse Social History:  History  Alcohol Use No     History  Drug Use  . Types: Cocaine, Marijuana    Social History   Social History  . Marital status: Single    Spouse name: N/A  . Number of children: N/A  . Years of education: N/A   Social History Main Topics  . Smoking status: Current Every Day Smoker    Packs/day: 0.50  . Smokeless tobacco: Never Used  . Alcohol use No  . Drug use: Yes    Types: Cocaine, Marijuana  . Sexual activity: Not Asked   Other Topics Concern  . None   Social History Narrative  . None   Additional Social History:     Sleep: Good  Appetite:  Good  Current Medications: Current Facility-Administered Medications  Medication Dose Route Frequency Provider Last Rate Last Dose  . acetaminophen (TYLENOL) tablet 650 mg  650 mg Oral Q6H PRN Patrecia Pour, NP      . alum & mag hydroxide-simeth (MAALOX/MYLANTA) 200-200-20 MG/5ML suspension 30 mL  30 mL Oral Q4H PRN Patrecia Pour, NP      . hydrOXYzine (ATARAX/VISTARIL) tablet 25 mg  25 mg Oral Q6H PRN Patrecia Pour, NP  25 mg at 08/24/16 2210  . ibuprofen (ADVIL,MOTRIN) tablet 600 mg  600 mg Oral Q8H PRN Patrecia Pour, NP      . magnesium hydroxide (MILK OF MAGNESIA) suspension 30 mL  30 mL Oral Daily PRN Patrecia Pour, NP      . nicotine (NICODERM CQ - dosed in mg/24 hours) patch 21 mg  21 mg Transdermal Daily Ursula Alert, MD   21 mg at 08/25/16 0831  . omega-3 acid ethyl esters (LOVAZA) capsule 1 g  1 g Oral BID Mordecai Maes, NP   1  g at 08/25/16 0831  . QUEtiapine (SEROQUEL) tablet 100 mg  100 mg Oral QHS Mordecai Maes, NP   100 mg at 08/24/16 2210  . sertraline (ZOLOFT) tablet 100 mg  100 mg Oral Daily Nanci Pina, FNP   100 mg at 08/25/16 0831  . traZODone (DESYREL) tablet 100 mg  100 mg Oral QHS PRN Patrecia Pour, NP   100 mg at 08/24/16 2210    Lab Results:  No results found for this or any previous visit (from the past 48 hour(s)).  Blood Alcohol level:  Lab Results  Component Value Date   ETH <5 08/16/2016   ETH <5 19/37/9024    Metabolic Disorder Labs: Lab Results  Component Value Date   HGBA1C 5.5 08/20/2016   MPG 111 08/20/2016   No results found for: PROLACTIN Lab Results  Component Value Date   CHOL 204 (H) 08/20/2016   TRIG 80 08/20/2016   HDL 41 08/20/2016   CHOLHDL 5.0 08/20/2016   VLDL 16 08/20/2016   LDLCALC 147 (H) 08/20/2016    Physical Findings: AIMS: Facial and Oral Movements Muscles of Facial Expression: None, normal Lips and Perioral Area: None, normal Jaw: None, normal Tongue: None, normal,Extremity Movements Upper (arms, wrists, hands, fingers): None, normal Lower (legs, knees, ankles, toes): None, normal, Trunk Movements Neck, shoulders, hips: None, normal, Overall Severity Severity of abnormal movements (highest score from questions above): None, normal Incapacitation due to abnormal movements: None, normal Patient's awareness of abnormal movements (rate only patient's report): No Awareness, Dental Status Current problems with teeth and/or dentures?: No Does patient usually wear dentures?: No  CIWA:    COWS:     Musculoskeletal: Strength & Muscle Tone: within normal limits Gait & Station: normal Patient leans: N/A  Psychiatric Specialty Exam: Physical Exam  Nursing note and vitals reviewed. Constitutional: He is oriented to person, place, and time.  Neurological: He is alert and oriented to person, place, and time.    Review of Systems   Psychiatric/Behavioral: Positive for depression and substance abuse. Negative for hallucinations, memory loss and suicidal ideas. The patient is nervous/anxious. The patient does not have insomnia.   All other systems reviewed and are negative. denies chest pain, no shortness of breath, no vomiting   Blood pressure 103/65, pulse 66, temperature 98.2 F (36.8 C), temperature source Oral, resp. rate 18, height 6' 6"  (1.981 m), weight (!) 167.6 kg (369 lb 8 oz).Body mass index is 42.7 kg/m.  General Appearance: Fairly Groomed  Eye Contact:  Fair  Speech:  Normal Rate  Volume:  Decreased  Mood:  Anxious and Depressed  Affect:  constricted   Thought Process:  Irrelevant and Descriptions of Associations: Intact  Orientation:  Full (Time, Place, and Person)  Thought Content:  ruminative, denies hallucinations, no delusions, not internally preoccupied  denies AVH.No ruminations, preoccupations   Suicidal Thoughts:  No at this time denies suicidal or self  injurious plans or intentions and is able to contract for safety on unit   Homicidal Thoughts:  No  Memory:  Recent and remote grossly intact   Judgement:  Fair  Insight:  Fair  Psychomotor Activity:  improving , more visible on unit   Concentration:  Concentration: Good and Attention Span: Good  Recall:  Good  Fund of Knowledge:  Good  Language:  Good  Akathisia:  Negative  Handed:  Right  AIMS (if indicated):     Assets:  Desire for Improvement Resilience  ADL's:  Intact  Cognition:  WNL  Sleep:  Number of Hours: 6.75   Assessment - patient presents depressed, constricted in affect, and expressing apprehension about discharge, stating he feels safe on unit,but worried about how he may feel once he returns home. Report from staff is that he has been intermittently irritable- at this time does not present overtly irritable or angry. Denies medication side effects, but we reviewed medication side effects, including potential weight gain on  Seroquel, and he agreed to medication change based on this concern, as BMI is 42.7  Treatment Plan Summary:  Treatment plan reviewed as below today 7/9  Daily contact with patient to assess and evaluate symptoms and progress in treatment  Encourage group and milieu participation to work on coping skills and symptom reduction  Depression:  Increase Zoloft to 150 mg po daily for depression management . Also discontinue Seroquel- see rationale above, and start Abilify 2 mgrs QDAY as antidepressant augmentation   Anxiety: Vistaril 25 mg TID PRN for anxiety.  Insomnia:  Continue Trazodone 100 mg QHS PRN  for insomnia.   Nicotine Dependence: Continue Nicoderm patch 21 mg TD for smoking cessation.  Hypercholesterolemia-continue Lovaza 1 g bid. Educated about diet and exercise. A  Treatment team working on disposition planning   Jenne Campus, MD 08/25/2016, 2:14 PM   Patient ID: Clementeen Graham, male   DOB: 01-02-1976, 41 y.o.   MRN: [id number]

## 2016-08-26 MED ORDER — ARIPIPRAZOLE 5 MG PO TABS
5.0000 mg | ORAL_TABLET | Freq: Every day | ORAL | Status: DC
  Administered 2016-08-27 – 2016-08-28 (×2): 5 mg via ORAL
  Filled 2016-08-26: qty 1
  Filled 2016-08-26: qty 7
  Filled 2016-08-26 (×2): qty 1

## 2016-08-26 NOTE — [facility] Group Notes (Signed)
[facility] LCSW Group Therapy 08/26/2016 1:15 PM  Type of Therapy: Group Therapy- Feelings about Diagnosis  Participation Level: Active   Participation Quality:  Appropriate  Affect:  Appropriate  Cognitive: Alert and Oriented   Insight:  Developing   Engagement in Therapy: Developing/Improving and Engaged   Modes of Intervention: Clarification, Confrontation, Discussion, Education, Exploration, Limit-setting, Orientation, Problem-solving, Rapport Building, Dance movement psychotherapisteality Testing, Socialization and Support  Description of Group:   This group will allow patients to explore their thoughts and feelings about diagnoses they have received. Patients will be guided to explore their level of understanding and acceptance of these diagnoses. Facilitator will encourage patients to process their thoughts and feelings about the reactions of others to their diagnosis, and will guide patients in identifying ways to discuss their diagnosis with significant others in their lives. This group will be process-oriented, with patients participating in exploration of their own experiences as well as giving and receiving support and challenge from other group members.  Summary of Progress/Problems:  Pt reports that he plans to be more open with the people in his life. He states that he would like to receive the support of others but he understands that they can't help support him if they don't know what's going on with him. Pt states that he is also going to put more effort towards going out more instead of constantly isolating in his house.  Therapeutic Modalities:   Cognitive Behavioral Therapy Solution Focused Therapy Motivational Interviewing Relapse Prevention Therapy  Donnelly StagerLynn Anquanette Bahner, MSW, LCSW 08/26/2016 2:00 PM

## 2016-08-26 NOTE — [facility] Group Notes (Signed)
Pt attended spiritual care group on grief and loss facilitated by chaplain Burnis KingfisherMatthew Corinthian Kemler   Group opened with brief discussion and psycho-social ed around grief and loss in relationships and in relation to self - identifying life patterns, circumstances, changes that cause losses. Established group norm of speaking from own life experience. Group goal of establishing open and affirming space for members to share loss and experience with grief, normalize grief experience and provide psycho social education and grief support.    Lloyde invited to group.   Did not attend.   Stated he was feeling tired due to medication changes.

## 2016-08-26 NOTE — Progress Notes (Signed)
Adult Psychoeducational Group Note  Date:  08/26/2016 Time:  12:04 AM  Group Topic/Focus:  Wrap-Up Group:   The focus of this group is to help patients review their daily goal of treatment and discuss progress on daily workbooks.  Participation Level:  Active  Participation Quality:  Appropriate and Sharing  Affect:  Appropriate  Cognitive:  Appropriate  Insight: Appropriate and Good  Engagement in Group:  Engaged  Modes of Intervention:  Discussion  Additional Comments:  Pt stated his goal for today was to work with his doctor on his discharge plan. Pt stated he achieved his goal. Pt rated his overall day a 8 out of 10. Pt stated something positive that happened today is he discover his where his next placement is going to be.  Felipa FurnaceChristopher  Sarabi Sockwell 08/26/2016, 12:04 AM

## 2016-08-26 NOTE — Progress Notes (Signed)
Patient ID: Jose BaldingJohn Hays, male   DOB: Jul 09, 1975, 41 y.o.   MRN: [id number] D: Patient continues to endorse thoughts of self harm.  He contracts for safety on the unit.  He rates his depression as a 6; hopelessness as a 7; anxiety as an 8.  Patient's goal today is to work on "exit plan."  Patient's sleep and appetite is fair; his energy level is low; his concentration is good.  Patient presents with irritable mood; he is minimal with staff and peers.  Patient is attending groups with minimal participation. A: Continue to monitor medication management and MD orders.  Safety checks completed every 15 minutes per protocol.  Offer support and encouragement as needed. R: Patient has minimal interaction with staff; his behavior is appropriate.

## 2016-08-26 NOTE — Progress Notes (Signed)
Nursing Progress Note: 7p-7a D: Pt currently presents with a flat/depressed/sarcastic affect and behavior. Pt states "I get to go home I think tomorrow, and I'm not worried. I'm ready to go I learned a lot here, and I'm gonna use it when I get out." Interacting appropriately with milieu. Pt reports good sleep during the previous night with current medication regimen.   A: Pt provided with medications per providers orders. Pt's labs and vitals were monitored throughout the night. Pt supported emotionally and encouraged to express concerns and questions. Pt educated on medications.  R: Pt's safety ensured with 15 minute and environmental checks. Pt currently denies SI, HI, and AVH. Pt verbally contracts to seek staff if SI,HI, or AVH occurs and to consult with staff before acting on any harmful thoughts. Will continue to monitor.

## 2016-08-26 NOTE — Progress Notes (Signed)
[facility] MD Progress Note  08/26/2016 6:09 PM Jose Hays  MRN:  [id number]  Subjective: Patient reports he is feeling slightly better, but reports ongoing depression, anxiety, and continues to report apprehension regarding discharge. States " I feel supported and safe here, I don't know how I will feel back home". Denies medication side effects. At this time denies any suicidal ideations,contracts for safety on unit.   Objective: I have discussed case with treatment team and have met with patient. As reviewed with staff, patient has presented with some improvement compared to admission, but still depressed, vaguely irritable, and endorsing passive SI . Participation in milieu is limited, but has been going to some groups, and is noted to be spending more time  in day room today. Patient denies medication side effects. He continues to ruminate about discharge, stating he lives alone and has limited social support. We discussed possible options such as trying to live with family member, friend, or even going to [employer], but patient states these are no viable options for him at this time. He is more future oriented today, and spoke about outpatient treatment follow up options. No disruptive or agitated behaviors on unit . Today denies suicidal plan or intention and contracts for safety on unit at this time.  Principal Problem: Major depressive disorder, recurrent severe without psychotic features ([place])   Diagnosis:   Patient Active Problem List   Diagnosis Date Noted  . 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 PTSD (post-traumatic stress disorder) [F43.10] 08/19/2016  . Right forearm injury [S59.911A] 08/19/2016  . Cocaine abuse [F14.10] 08/17/2016  . Major depressive disorder, recurrent severe without psychotic features ([place]) [F33.2] 09/08/2014   Total Time spent with patient: 20 minutes  Past Psychiatric History: Depression, SI, substance abuse. As per past  discharge notes, patients past medications included Prozac 20 mg po, Seroquel 100 mg po daily at bedtime, Wellbutrin XL 300 mg po daily,  Trazodone. Past outpatient services as per note with most recent was [facility], [place], [facility], and [facility].   Past Medical History:  Past Medical History:  Diagnosis Date  . Coronary artery disease   . Hypertension    History reviewed. No pertinent surgical history. Family History:  Family History  Problem Relation Age of Onset  . Alcohol abuse Father    Family Psychiatric  History: Family history of mental illness and substance abuse Social History:  History  Alcohol Use No     History  Drug Use  . Types: Cocaine, Marijuana    Social History   Social History  . Marital status: Single    Spouse name: N/A  . Number of children: N/A  . Years of education: N/A   Social History Main Topics  . Smoking status: Current Every Day Smoker    Packs/day: 0.50  . Smokeless tobacco: Never Used  . Alcohol use No  . Drug use: Yes    Types: Cocaine, Marijuana  . Sexual activity: Not Asked   Other Topics Concern  . None   Social History Narrative  . None   Additional Social History:     Sleep: Good  Appetite:  Good  Current Medications: Current Facility-Administered Medications  Medication Dose Route Frequency Provider Last Rate Last Dose  . acetaminophen (TYLENOL) tablet 650 mg  650 mg Oral Q6H PRN Patrecia Pour, NP      . alum & mag hydroxide-simeth (MAALOX/MYLANTA) 200-200-20 MG/5ML suspension 30 mL  30 mL Oral  Q4H PRN Patrecia Pour, NP      . Derrill Memo ON 08/27/2016] ARIPiprazole (ABILIFY) tablet 5 mg  5 mg Oral Daily Kamila Broda A, MD      . hydrOXYzine (ATARAX/VISTARIL) tablet 25 mg  25 mg Oral Q6H PRN Patrecia Pour, NP   25 mg at 08/25/16 2145  . ibuprofen (ADVIL,MOTRIN) tablet 600 mg  600 mg Oral Q8H PRN Patrecia Pour, NP      . magnesium hydroxide (MILK OF MAGNESIA) suspension 30 mL  30 mL Oral  Daily PRN Patrecia Pour, NP      . nicotine (NICODERM CQ - dosed in mg/24 hours) patch 21 mg  21 mg Transdermal Daily Ursula Alert, MD   21 mg at 08/26/16 0810  . omega-3 acid ethyl esters (LOVAZA) capsule 1 g  1 g Oral BID Mordecai Maes, NP   1 g at 08/26/16 1703  . sertraline (ZOLOFT) tablet 150 mg  150 mg Oral Daily Gloyd Happ, Myer Peer, MD   150 mg at 08/26/16 0808  . traZODone (DESYREL) tablet 100 mg  100 mg Oral QHS PRN Patrecia Pour, NP   100 mg at 08/25/16 2145    Lab Results:  No results found for this or any previous visit (from the past 48 hour(s)).  Blood Alcohol level:  Lab Results  Component Value Date   ETH <5 08/16/2016   ETH <5 18/29/9371    Metabolic Disorder Labs: Lab Results  Component Value Date   HGBA1C 5.5 08/20/2016   MPG 111 08/20/2016   No results found for: PROLACTIN Lab Results  Component Value Date   CHOL 204 (H) 08/20/2016   TRIG 80 08/20/2016   HDL 41 08/20/2016   CHOLHDL 5.0 08/20/2016   VLDL 16 08/20/2016   LDLCALC 147 (H) 08/20/2016    Physical Findings: AIMS: Facial and Oral Movements Muscles of Facial Expression: None, normal Lips and Perioral Area: None, normal Jaw: None, normal Tongue: None, normal,Extremity Movements Upper (arms, wrists, hands, fingers): None, normal Lower (legs, knees, ankles, toes): None, normal, Trunk Movements Neck, shoulders, hips: None, normal, Overall Severity Severity of abnormal movements (highest score from questions above): None, normal Incapacitation due to abnormal movements: None, normal Patient's awareness of abnormal movements (rate only patient's report): No Awareness, Dental Status Current problems with teeth and/or dentures?: No Does patient usually wear dentures?: No  CIWA:    COWS:     Musculoskeletal: Strength & Muscle Tone: within normal limits Gait & Station: normal Patient leans: N/A  Psychiatric Specialty Exam: Physical Exam  Nursing note and vitals  reviewed. Constitutional: He is oriented to person, place, and time.  Neurological: He is alert and oriented to person, place, and time.    Review of Systems  Psychiatric/Behavioral: Positive for depression and substance abuse. Negative for hallucinations, memory loss and suicidal ideas. The patient is nervous/anxious. The patient does not have insomnia.   All other systems reviewed and are negative. chronic R arm/hand weakness and neurological sequelae following trauma to R arm, denies chest pain, no shortness of breath, no vomiting   Blood pressure 110/72, pulse 61, temperature 97.7 F (36.5 C), temperature source Oral, resp. rate 20, height 6' 6"  (1.981 m), weight (!) 167.6 kg (369 lb 8 oz).Body mass index is 42.7 kg/m.  General Appearance: Fairly Groomed  Eye Contact:  improved eye contact today  Speech:  Normal Rate  Volume:  Normal  Mood:  remains depressed, some improvement compared to admission  Affect:  constricted,  anxious, does smile at times appropriately  Thought Process:  Linear and Descriptions of Associations: Intact  Orientation:  Full (Time, Place, and Person)  Thought Content:  no hallucinations, no delusions expressed, ruminative  denies AVH.No ruminations, preoccupations   Suicidal Thoughts:  No at this time denies suicidal or self injurious plans or intentions and is able to contract for safety on unit   Homicidal Thoughts:  No denies any violent or homicidal ideations  Memory:  Recent and remote grossly intact   Judgement:  Fair  Insight:  Fair  Psychomotor Activity:  improving , more visible on unit   Concentration:  Concentration: Good and Attention Span: Good  Recall:  Good  Fund of Knowledge:  Good  Language:  Good  Akathisia:  Negative  Handed:  Right  AIMS (if indicated):     Assets:  Desire for Improvement Resilience  ADL's:  Intact  Cognition:  WNL  Sleep:  Number of Hours: 6.75   Assessment - patient presents with some improvement but reports  ongoing depression, anxiety, and in particular reports sense of apprehension about being discharged, stating he feels support and safety on unit, and lives alone, with limited social support . Denies SI at this time, but does endorse some residual depression, sadness . Denies medication side effects and is tolerating Zoloft and Abilify well thus far.    Treatment Plan Summary:  Treatment plan reviewed as below today 7/10   Daily contact with patient to assess and evaluate symptoms and progress in treatment  Encourage group and milieu participation to work on coping skills and symptom reduction  Depression:   Continue  Zoloft 150 mg po daily for depression management . Also discontinue Seroquel- see rationale above, and increase  Abilify to 5  mgrs QDAY as antidepressant augmentation   Anxiety: Vistaril 25 mg TID PRN for anxiety.  Insomnia:  Continue Trazodone 100 mg QHS PRN  for insomnia.   Nicotine Dependence: Continue Nicoderm patch 21 mg TD for smoking cessation.  Hypercholesterolemia-continue Lovaza 1 g BID. Educated about diet and exercise. A  Treatment team working on disposition planning   Jenne Campus, MD 08/26/2016, 6:09 PM   Patient ID: Clementeen Graham, male   DOB: [birth year], 41 y.o.   MRN: [id number]

## 2016-08-27 NOTE — Progress Notes (Signed)
Patient ID: Jose BaldingJohn Hays, male   DOB: August 27, 1975, 41 y.o.   MRN: [id number]  DAR: Pt. Denies SI/HI and A/V Hallucinations. He reports sleep is fair, appetite is fair, energy level is normal, and concentration is poor. He rates depression 7/10, hopelessness 6/10, and anxiety 7/10. Patient does not report any pain or discomfort at this time. Support and encouragement provided to the patient however patient is minimal with Clinical research associatewriter. He can be sarcastic and silly at times but overall is pleasant. He is seen in the milieu interacting with his peers. Q15 minute checks are maintained for safety.

## 2016-08-27 NOTE — [facility] Group Notes (Signed)
[facility] 08/27/2016 1:15pm  Type of Therapy: Mental Health Association Presentation  Participation Level: Active  Participation Quality: Attentive  Affect: Appropriate  Cognitive: Oriented  Insight: Developing/Improving  Engagement in Therapy: Engaged  Modes of Intervention: Discussion, Education and Socialization  Summary of Progress/Problems: Mental Health Association (MHA) Speaker came to talk about his personal journey with living with a mental health diagnosis.The pt processed ways by which to relate to the speaker. MHA speaker provided handouts and educational information pertaining to groups and services offered by the [facility]. Pt was engaged in speaker's presentation and was receptive to resources provided.    Vito BackersLynn B. Beverely PaceBryant, MSW, [facility]oCSWA 08/27/2016 4:19 PM

## 2016-08-27 NOTE — Progress Notes (Signed)
[facility] MD Progress Note  08/27/2016 4:58 PM Jose Hays  MRN:  [id number]  Subjective: Reports he is feeling better, less severely depressed. At this time denies suicidal ideations.  Presents future oriented, and at this time focusing on transportation issues at discharge. States his home is far from bus [place] transportation line, and that he cannot currently afford taxi or [place]. He says " I've been trying to get a friend to pick me up but I am not sure if it will work out". Denies medication side effects, and states he feels medications are working .   Objective: I have discussed case with treatment team and have met with patient. Patient presents with improving mood and range of affect today- less depressed, denies suicidal ideations, more focused on discharge planning , less apprehensive about upcoming discharge. Denies medication side effects. He has been more visible on unit, going to some groups. Pleasant on approach. Denies current suicidal ideations .  Principal Problem: Major depressive disorder, recurrent severe without psychotic features ([place])   Diagnosis:   Patient Active Problem List   Diagnosis Date Noted  . 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 PTSD (post-traumatic stress disorder) [F43.10] 08/19/2016  . Right forearm injury [S59.911A] 08/19/2016  . Cocaine abuse [F14.10] 08/17/2016  . Major depressive disorder, recurrent severe without psychotic features ([place]) [F33.2] 09/08/2014   Total Time spent with patient: 20 minutes  Past Psychiatric History: Depression, SI, substance abuse. As per past discharge notes, patients past medications included Prozac 20 mg po, Seroquel 100 mg po daily at bedtime, Wellbutrin XL 300 mg po daily,  Trazodone. Past outpatient services as per note with most recent was [facility], [facility], [facility], and [facility].   Past Medical History:  Past Medical History:  Diagnosis Date  . Coronary artery  disease   . Hypertension    History reviewed. No pertinent surgical history. Family History:  Family History  Problem Relation Age of Onset  . Alcohol abuse Father    Family Psychiatric  History: Family history of mental illness and substance abuse Social History:  History  Alcohol Use No     History  Drug Use  . Types: Cocaine, Marijuana    Social History   Social History  . Marital status: Single    Spouse name: N/A  . Number of children: N/A  . Years of education: N/A   Social History Main Topics  . Smoking status: Current Every Day Smoker    Packs/day: 0.50  . Smokeless tobacco: Never Used  . Alcohol use No  . Drug use: Yes    Types: Cocaine, Marijuana  . Sexual activity: Not Asked   Other Topics Concern  . None   Social History Narrative  . None   Additional Social History:     Sleep: Good  Appetite:  Good  Current Medications: Current Facility-Administered Medications  Medication Dose Route Frequency Provider Last Rate Last Dose  . acetaminophen (TYLENOL) tablet 650 mg  650 mg Oral Q6H PRN Patrecia Pour, NP      . alum & mag hydroxide-simeth (MAALOX/MYLANTA) 200-200-20 MG/5ML suspension 30 mL  30 mL Oral Q4H PRN Patrecia Pour, NP      . ARIPiprazole (ABILIFY) tablet 5 mg  5 mg Oral Daily Cobos, Myer Peer, MD   5 mg at 08/27/16 0749  . hydrOXYzine (ATARAX/VISTARIL) tablet 25 mg  25 mg Oral Q6H PRN Patrecia Pour, NP   25 mg at 08/26/16 2241  .  ibuprofen (ADVIL,MOTRIN) tablet 600 mg  600 mg Oral Q8H PRN Patrecia Pour, NP      . magnesium hydroxide (MILK OF MAGNESIA) suspension 30 mL  30 mL Oral Daily PRN Patrecia Pour, NP      . nicotine (NICODERM CQ - dosed in mg/24 hours) patch 21 mg  21 mg Transdermal Daily Ursula Alert, MD   21 mg at 08/27/16 0749  . omega-3 acid ethyl esters (LOVAZA) capsule 1 g  1 g Oral BID Mordecai Maes, NP   1 g at 08/27/16 0748  . sertraline (ZOLOFT) tablet 150 mg  150 mg Oral Daily Cobos, Myer Peer, MD   150 mg  at 08/27/16 0748  . traZODone (DESYREL) tablet 100 mg  100 mg Oral QHS PRN Patrecia Pour, NP   100 mg at 08/26/16 2241    Lab Results:  No results found for this or any previous visit (from the past 48 hour(s)).  Blood Alcohol level:  Lab Results  Component Value Date   ETH <5 08/16/2016   ETH <5 49/44/9675    Metabolic Disorder Labs: Lab Results  Component Value Date   HGBA1C 5.5 08/20/2016   MPG 111 08/20/2016   No results found for: PROLACTIN Lab Results  Component Value Date   CHOL 204 (H) 08/20/2016   TRIG 80 08/20/2016   HDL 41 08/20/2016   CHOLHDL 5.0 08/20/2016   VLDL 16 08/20/2016   LDLCALC 147 (H) 08/20/2016    Physical Findings: AIMS: Facial and Oral Movements Muscles of Facial Expression: None, normal Lips and Perioral Area: None, normal Jaw: None, normal Tongue: None, normal,Extremity Movements Upper (arms, wrists, hands, fingers): None, normal Lower (legs, knees, ankles, toes): None, normal, Trunk Movements Neck, shoulders, hips: None, normal, Overall Severity Severity of abnormal movements (highest score from questions above): None, normal Incapacitation due to abnormal movements: None, normal Patient's awareness of abnormal movements (rate only patient's report): No Awareness, Dental Status Current problems with teeth and/or dentures?: No Does patient usually wear dentures?: No  CIWA:    COWS:     Musculoskeletal: Strength & Muscle Tone: within normal limits Gait & Station: normal Patient leans: N/A  Psychiatric Specialty Exam: Physical Exam  Nursing note and vitals reviewed. Constitutional: He is oriented to person, place, and time.  Neurological: He is alert and oriented to person, place, and time.    Review of Systems  Psychiatric/Behavioral: Positive for depression and substance abuse. Negative for hallucinations, memory loss and suicidal ideas. The patient is nervous/anxious. The patient does not have insomnia.   All other systems  reviewed and are negative. chronic R arm/hand weakness and neurological sequelae following trauma to R arm, denies chest pain, no shortness of breath, no vomiting   Blood pressure 129/74, pulse 61, temperature 98.2 F (36.8 C), temperature source Oral, resp. rate 18, height 6' 6"  (1.981 m), weight (!) 167.6 kg (369 lb 8 oz).Body mass index is 42.7 kg/m.  General Appearance: grooming has improved   Eye Contact:  Good  Speech:  Normal Rate  Volume:  Normal  Mood:  less depressed   Affect:  less constricted, more reactive   Thought Process:  Linear and Descriptions of Associations: Intact  Orientation:  Full (Time, Place, and Person)  Thought Content:  no hallucinations, no delusions, not internally preoccupied     Suicidal Thoughts:  No denies suicidal or self injurious ideations at this time  Homicidal Thoughts:  No denies any violent or homicidal ideations  Memory:  Recent and remote grossly intact   Judgement:  Fair  Insight:  Fair  Psychomotor Activity:  improving , more visible on unit   Concentration:  Concentration: Good and Attention Span: Good  Recall:  Good  Fund of Knowledge:  Good  Language:  Good  Akathisia:  Negative  Handed:  Right  AIMS (if indicated):     Assets:  Desire for Improvement Resilience  ADL's:  Intact  Cognition:  WNL  Sleep:  Number of Hours: 5.75   Assessment - patient presents with improving mood and range of affect. Denies suicidal ideations at this time. Presents less anxious and less apprehensive about discharge today, and is currently actively working on issues regarding transportation home at discharge. Tolerating medications well , denies side effects.    Treatment Plan Summary:  Treatment plan reviewed as below today 7/11  Daily contact with patient to assess and evaluate symptoms and progress in treatment  Encourage group and milieu participation to work on coping skills and symptom reduction  Depression:   Continue  Zoloft 150 mg po  daily for depression management . Continue Abilify  5  mgrs QDAY as antidepressant augmentation   Anxiety: Vistaril 25 mg TID PRN for anxiety.  Insomnia:  Continue Trazodone 100 mg QHS PRN  for insomnia.   Nicotine Dependence: Continue Nicoderm patch 21 mg TD for smoking cessation.  Hypercholesterolemia-continue Lovaza 1 g BID. Educated about diet and exercise. A  Treatment team working on disposition planning   Jenne Campus, MD 08/27/2016, 4:58 PM   Patient ID: Clementeen Graham, male   DOB: 12/02/1975, 41 y.o.   MRN: [id number]

## 2016-08-27 NOTE — Progress Notes (Signed)
Recreation Therapy Notes  Date:08/27/16 Time:0930 Location: 300 Hall Dayroom  Group Topic: Stress Management  Goal Area(s) Addresses:  Patient will verbalize importance of using healthy stress management.  Patient will identify positive emotions associated with healthy stress management.   Intervention: Stress Management  Activity: Meditation. Recreation Therapy Intern introduced the stress management technique of meditation. Recreation Therapy Intern read a script that allowed patients to take mental trip to the mountains. Recreation Therapy Intern played mountain meditation music. Patients were to follow along as script was read to engage in the activity.  Education:Stress Management, Discharge Planning.   Education Outcome:Needs additional education  Clinical Observations/Feedback:Pt did not attend group.  Rachel Meyer, Recreation Therapy Intern  Ankith Edmonston, LRT/CTRS 

## 2016-08-27 NOTE — Progress Notes (Signed)
Nursing Progress Note: 7p-7a D: Pt currently presents with a pleasant/silly/worried affect and behavior. Pt states "I feel good I'm just worried about going home to that big house where only I live in. It's just depressing to be alone. I have a plan to sell it and buy something smaller and more manageable, so I am not overwhelmed. It's a lot but I think I can do it. I just got to stop making light of everything and start taking my life more seriously." Interacting appropriately with milieu. Pt reports good sleep during the previous night with current medication regimen.   A: Pt provided with medications per providers orders. Pt's labs and vitals were monitored throughout the night. Pt supported emotionally and encouraged to express concerns and questions. Pt educated on medications.  R: Pt's safety ensured with 15 minute and environmental checks. Pt currently denies SI, HI, and AVH. Pt verbally contracts to seek staff if SI,HI, or AVH occurs and to consult with staff before acting on any harmful thoughts. Will continue to monitor.

## 2016-08-28 ENCOUNTER — Encounter (HOSPITAL_COMMUNITY): Payer: Self-pay | Admitting: Behavioral Health

## 2016-08-28 MED ORDER — NICOTINE 21 MG/24HR TD PT24: 21.0000 mg | MEDICATED_PATCH | Freq: Every day | TRANSDERMAL | 0 refills | Status: DC

## 2016-08-28 MED ORDER — TRAZODONE HCL 100 MG PO TABS: 100.0000 mg | ORAL_TABLET | Freq: Every evening | ORAL | 0 refills | Status: DC | PRN

## 2016-08-28 MED ORDER — OMEGA-3-ACID ETHYL ESTERS 1 G PO CAPS: 1.0000 g | ORAL_CAPSULE | Freq: Two times a day (BID) | ORAL | 0 refills | Status: DC

## 2016-08-28 MED ORDER — SERTRALINE HCL 50 MG PO TABS: 150.0000 mg | ORAL_TABLET | Freq: Every day | ORAL | 0 refills | Status: DC

## 2016-08-28 MED ORDER — HYDROXYZINE HCL 25 MG PO TABS: 25.0000 mg | ORAL_TABLET | Freq: Four times a day (QID) | ORAL | 0 refills | Status: DC | PRN

## 2016-08-28 MED ORDER — ARIPIPRAZOLE 5 MG PO TABS: 5.0000 mg | ORAL_TABLET | Freq: Every day | ORAL | 0 refills | Status: DC

## 2016-08-28 NOTE — Progress Notes (Signed)
Pt attend wrap up group. His day was a 7. Pt said his goal to work on exit plan. He achieve his goal. He attend all the group session today.

## 2016-08-28 NOTE — [facility] Suicide Risk Assessment (Addendum)
[facility]   Principal Problem: Major depressive disorder, recurrent severe without psychotic features [facility](HCC) Discharge Diagnoses:  Patient Active Problem List   Diagnosis Date Noted  . 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 PTSD (post-traumatic stress disorder) [F43.10] 08/19/2016  . Right forearm injury [S59.911A] 08/19/2016  . Cocaine abuse [F14.10] 08/17/2016  . Major depressive disorder, recurrent severe without psychotic features (HCC) [F33.2] 09/08/2014    Total Time spent with patient: 30 minutes  Musculoskeletal: Strength & Muscle Tone: within normal limits- neurologic sequelae from R arm trauma- hand weakness, limited mobility Gait & Station: normal Patient leans: N/A  Psychiatric Specialty Exam: ROS denies headache, no chest pain, no shortness of breath, no nausea, no vomiting   Blood pressure 107/75, pulse 60, temperature 98.3 F (36.8 C), temperature source Oral, resp. rate 18, height 6\' 6"  (1.981 m), weight (!) 167.6 kg (369 lb 8 oz).Body mass index is 42.7 kg/m.  General Appearance: improved grooming   Eye Contact::  Good  Speech:  Normal Rate409  Volume:  Normal  Mood:  improving mood , states " I feel better"  Affect:  Appropriate and less constricted   Thought Process:  Linear and Descriptions of Associations: Intact  Orientation:  Full (Time, Place, and Person)  Thought Content:  denies hallucinations, no delusions , not internally preoccupied   Suicidal Thoughts:  No denies any suicidal or self injurious ideations, no homicidal or violent ideations   Homicidal Thoughts:  No  Memory:  recent and remote grossly intact   Judgement:  Fair- improving   Insight:  fair- improving   Psychomotor Activity:  Normal  Concentration:  Good  Recall:  Good  Fund of Knowledge:Good  Language: Good  Akathisia:  Negative  Handed:  Right  AIMS (if indicated):   no abnormal or involuntary movements noted or reported  Assets:   Communication Skills Desire for Improvement Resilience  Sleep:  Number of Hours: 5.75  Cognition: WNL  ADL's:  Intact   Mental Status Per Nursing Assessment::   On Admission:  Suicidal ideation indicated by patient, Suicide plan, Plan includes specific time, place, or method, Self-harm thoughts, Self-harm behaviors  Demographic Factors:  41 year old male, has four children, who live with their mother, [place] of state. Currently unemployed . Lives alone .  Loss Factors: History of R arm trauma 2 years ago which resulted in permanent neurological sequelae, loss of function, Socially isolated. Brother died 2016 . Children are out of state.   Historical Factors: Prior psychiatric admission, prior suicide attempts, history of depression  Risk Reduction Factors:   Responsible for children under 39 years of age, Sense of responsibility to family and Positive coping skills or problem solving skills  Continued Clinical Symptoms:  At this time patient is alert and attentive, well related, mood is partially improved, states he is feeling better, affect is appropriate, more reactive, less constricted, no thought disorder, no suicidal or self injurious ideations, no homicidal or violent ideations, no psychotic symptoms, more future oriented. States, for example, that he is thinking of putting his house for sale and moving into an apartment  " for a change of scenery and be closer to civilization, so I can go to more groups".  Denies medication side effects. Behavior on unit in good control.  Cognitive Features That Contribute To Risk:  No gross cognitive deficits noted upon discharge. Is alert , attentive, and oriented x 3   Suicide Risk:  Mild:  Suicidal ideation  of limited frequency, intensity, duration, and specificity.  There are no identifiable plans, no associated intent, mild dysphoria and related symptoms, good self-control (both objective and subjective assessment), few other risk factors,  and identifiable protective factors, including available and accessible social support.  Follow-up Information    [facility] Follow up on 09/01/2016.   Why:  Hospital follow-up appointment 7/16 with Ellison Carwin at 8:30am. Contact information: [address] [place], [place] [address] P: [phone] F: [phone]       Services, Alcohol And Drug Follow up.   Specialty:  Behavioral Health Why:  Please go for a walk-in appointment within 7 days of discharge to be established for intensvive outpatient services. Walk-in hours are Mon, Wed, Fri 12p-3pm. Please arrive as early as possible to be sure that you are seen. Thank you. Contact information: [address] [place] [address] [phone]           Plan Of Care/Follow-up recommendations:  Activity:  as tolerated  Diet:  Regular Tests:  NA Other:  See below  Patient is leaving unit in good spirits  Plans to return home Follow up as above Medication side effects, including risk of metabolic and movement disorders related to Abilify, discussed , reviewed . Craige Cotta, MD 08/28/2016, 3:03 PM

## 2016-08-28 NOTE — Progress Notes (Signed)
Pt denies depression, anxiety and suicidal thoughts. Pt preparing for discharge today. Pt stated that his ride will be here at 8 pm to pick him up.

## 2016-08-28 NOTE — Progress Notes (Signed)
Pt discharged to the lobby at this time, reports that his uncle will be here to pick him up in the next hour. Jose Hays denies any SI and reports feeling anxious but ready to go. Discharge instructions provided and Jose Hays verbalized understanding. Supply of medications and prescriptions provided, and all belongings were returned.
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
## 2016-08-28 NOTE — Progress Notes (Signed)
  [facility] :  Will you be returning to the same living situation after discharge:  Yes,  Pt returning home At discharge, do you have transportation home?: Yes,  pt provided with bus passes if friend is not able to pick up Do you have the ability to pay for your medications: Yes,  Pt provided with samples and prescriptions  Release of information consent forms completed and in the chart;  Patient's signature needed at discharge.  Patient to Follow up at: Follow-up Information    [facility] Follow up on 09/01/2016.   Why:  Hospital follow-up appointment 7/16 with Ellison CarwinFrances Gill at 8:30am. Contact information: [address] [place], [place] [address] P: [phone]) [phone] F: [phone]       Services, Alcohol And Drug Follow up.   Specialty:  Behavioral Health Why:  Please go for a walk-in appointment within 7 days of discharge to be established for intensvive outpatient services. Walk-in hours are Mon, Wed, Fri 12p-3pm. Please arrive as early as possible to be sure that you are seen. Thank you. Contact information: [address] [place] [address] [phone]           Next level of care provider has [facility] to [facility] Link:no  Safety Planning and Suicide Prevention discussed: Yes,  with Pt; 2 unsuccessful attempts to contact friend  Have you used any form of tobacco in the last 30 days? (Cigarettes, Smokeless Tobacco, Cigars, and/or Pipes): Yes  Has patient been referred to the Quitline?: Patient refused referral  Patient has been referred for addiction treatment: Yes  Verdene LennertLauren C Randee Upchurch 08/28/2016, 9:15 AM

## 2016-08-28 NOTE — Progress Notes (Signed)
Initially pt asked to be discharged at 5 pm and then 8 pm. Pt asked for writer to speak with his uncle about transportation at 6:35 pm. Pt uncle asked if he could pick pt up at 9:30 pm. Writer asked Lillia AbedLindsay, [facility] if pt can be picked up at that time. Lillia AbedLindsay, [facility] offered pt a bus pass, pt declined stating that he doesn't live in the area. Pt then asked if he could be discharged at 5:00 am in the morning. Per Kenney HousemanLindsay, AC., pt may be discharged to the lobby at 8:00 pm and wait for his ride. Writer made pt aware of having to discharge to the lobby at 8:00 pm and pt verbalized understanding and stated that he will let his uncle know. Writer will report off to the oncoming shift RN.

## 2016-11-10 ENCOUNTER — Emergency Department (HOSPITAL_COMMUNITY)
Admission: EM | Admit: 2016-11-10 | Discharge: 2016-11-11 | Disposition: A | Discharge status: Home / Self Care | Payer: Self-pay | Attending: Emergency Medicine | Admitting: Emergency Medicine

## 2016-11-10 ENCOUNTER — Encounter (HOSPITAL_COMMUNITY): Payer: Self-pay

## 2016-11-10 DIAGNOSIS — F1414 Cocaine abuse with cocaine-induced mood disorder: Secondary | ICD-10-CM | POA: Insufficient documentation

## 2016-11-10 DIAGNOSIS — F17210 Nicotine dependence, cigarettes, uncomplicated: Secondary | ICD-10-CM | POA: Insufficient documentation

## 2016-11-10 DIAGNOSIS — Z79899 Other long term (current) drug therapy: Secondary | ICD-10-CM | POA: Insufficient documentation

## 2016-11-10 DIAGNOSIS — I10 Essential (primary) hypertension: Secondary | ICD-10-CM | POA: Insufficient documentation

## 2016-11-10 DIAGNOSIS — R45851 Suicidal ideations: Secondary | ICD-10-CM | POA: Insufficient documentation

## 2016-11-10 LAB — ETHANOL: Alcohol, Ethyl (B): <5 mg/dL (ref <5)

## 2016-11-10 LAB — CBC WITH DIFFERENTIAL/PLATELET
Basophils Absolute: 0 10*3/uL (ref 0.0–0.1)
Basophils Relative: 0 %
EOS ABS: 0.1 10*3/uL (ref 0.0–0.7)
EOS PCT: 1 %
HCT: 48.5 % (ref 39.0–52.0)
HEMOGLOBIN: 16 g/dL (ref 13.0–17.0)
LYMPHS ABS: 2.4 10*3/uL (ref 0.7–4.0)
Lymphocytes Relative: 21 %
MCH: 27.2 pg (ref 26.0–34.0)
MCHC: 33 g/dL (ref 30.0–36.0)
MCV: 82.3 fL (ref 78.0–100.0)
MONO ABS: 0.8 10*3/uL (ref 0.1–1.0)
MONOS PCT: 7 %
NEUTROS PCT: 71 %
Neutro Abs: 7.9 10*3/uL — ABNORMAL HIGH (ref 1.7–7.7)
Platelets: 190 10*3/uL (ref 150–400)
RBC: 5.89 MIL/uL — AB (ref 4.22–5.81)
RDW: 15.2 % (ref 11.5–15.5)
WBC: 11.2 10*3/uL — ABNORMAL HIGH (ref 4.0–10.5)

## 2016-11-10 LAB — BASIC METABOLIC PANEL
ANION GAP: 9 (ref 5–15)
BUN: 9 mg/dL (ref 6–20)
CALCIUM: 9.1 mg/dL (ref 8.9–10.3)
CO2: 27 mmol/L (ref 22–32)
CREATININE: 1.19 mg/dL (ref 0.61–1.24)
Chloride: 101 mmol/L (ref 101–111)
GFR calc Af Amer: >60 mL/min (ref >60)
GLUCOSE: 105 mg/dL — AB (ref 65–99)
Potassium: 4.2 mmol/L (ref 3.5–5.1)
Sodium: 137 mmol/L (ref 135–145)

## 2016-11-10 MED ORDER — HYDROXYZINE HCL 25 MG PO TABS
25.0000 mg | ORAL_TABLET | Freq: Four times a day (QID) | ORAL | Status: DC | PRN
  Administered 2016-11-11: 25 mg via ORAL
  Filled 2016-11-10: qty 1

## 2016-11-10 MED ORDER — SERTRALINE HCL 50 MG PO TABS
150.0000 mg | ORAL_TABLET | Freq: Every day | ORAL | Status: DC
  Administered 2016-11-10 – 2016-11-11 (×2): 150 mg via ORAL
  Filled 2016-11-10 (×2): qty 3

## 2016-11-10 MED ORDER — TRAZODONE HCL 100 MG PO TABS: 100.0000 mg | ORAL_TABLET | Freq: Every evening | ORAL | Status: DC | PRN

## 2016-11-10 MED ORDER — IBUPROFEN 200 MG PO TABS: 600.0000 mg | ORAL_TABLET | Freq: Three times a day (TID) | ORAL | Status: DC | PRN

## 2016-11-10 MED ORDER — ARIPIPRAZOLE 5 MG PO TABS
5.0000 mg | ORAL_TABLET | Freq: Every day | ORAL | Status: DC
  Administered 2016-11-10 – 2016-11-11 (×2): 5 mg via ORAL
  Filled 2016-11-10 (×2): qty 1

## 2016-11-10 MED ORDER — ONDANSETRON HCL 4 MG PO TABS: 4.0000 mg | ORAL_TABLET | Freq: Three times a day (TID) | ORAL | Status: DC | PRN

## 2016-11-10 NOTE — ED Notes (Signed)
Pt oriented to room and unit.  Pt appears very depressed.  Pt contracts for safety.  15 minute checks and video monitoring in place.

## 2016-11-10 NOTE — ED Notes (Signed)
Pt has been encouraged to provide a urine sample.
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
## 2016-11-10 NOTE — ED Notes (Signed)
Chaplain following for support.  Familiar with pt from previous admissions at [facility].   Came to [facility] for initial contact with pt on this admission.   Kenyatta was sleeping.   Attempted to wake him gently and he did not awaken.  RN will report that chaplain was present.  Will follow up with Jonny Ruiz for assessment and support.      [facility] Chaplain Burnis Kingfisher, MDiv
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
## 2016-11-10 NOTE — ED Provider Notes (Signed)
[facility]-EMER[facility]ENCY DEPT Provider Note   CSN: [id number] Arrival date & time: 11/10/16  0407     History   Chief Complaint Chief Complaint  Patient presents with  . Suicidal   HPI Jose Hays is a 41 y.o. male.  Patient is a 41 year old male with past medical history of depression, PTSD, polysubstance abuse. He presents for evaluation of depression, suicidal ideation. This is been worsening for the past several weeks. He reports running out of his medications approximately one month ago and these feelings have worsened. He cites his current living arrangements and financial issues as stressors. He states that he recently sold his home and has been living in a hotel, and reports that money is running out. He reports recent use of Xanax and cocaine, stating that he "doesn't want to wake up after taking these."   The history is provided by the patient.    Past Medical History:  Diagnosis Date  . Coronary artery disease   . Hypertension     Patient Active Problem List   Diagnosis Date Noted  . Cocaine use disorder, mild, abuse 08/19/2016  . PTSD (post-traumatic stress disorder) 08/19/2016  . Right forearm injury 08/19/2016  . Cocaine abuse 08/17/2016  . Major depressive disorder, recurrent severe without psychotic features (HCC) 09/08/2014    History reviewed. No pertinent surgical history.     Home Medications    Prior to Admission medications   Medication Sig Start Date End Date Taking? Authorizing Provider  ARIPiprazole (ABILIFY) 5 MG tablet Take 1 tablet (5 mg total) by mouth daily. 08/29/16   Denzil Magnuson, NP  hydrOXYzine (ATARAX/VISTARIL) 25 MG tablet Take 1 tablet (25 mg total) by mouth every 6 (six) hours as needed for anxiety. 08/28/16   Denzil Magnuson, NP  ibuprofen (ADVIL,MOTRIN) 200 MG tablet Take 200-600 mg by mouth every 6 (six) hours as needed for moderate pain.    [provider]  nicotine (NICODERM CQ - DOSED IN MG/24 HOURS) 21 mg/24hr patch  Place 1 patch (21 mg total) onto the skin daily. 08/29/16   Denzil Magnuson, NP  omega-3 acid ethyl esters (LOVAZA) 1 g capsule Take 1 capsule (1 g total) by mouth 2 (two) times daily. 08/28/16   Denzil Magnuson, NP  sertraline (ZOLOFT) 50 MG tablet Take 3 tablets (150 mg total) by mouth daily. 08/29/16   Denzil Magnuson, NP  traZODone (DESYREL) 100 MG tablet Take 1 tablet (100 mg total) by mouth at bedtime as needed for sleep. 08/28/16   Denzil Magnuson, NP    Family History Family History  Problem Relation Age of Onset  . Alcohol abuse Father     Social History Social History  Substance Use Topics  . Smoking status: Current Every Day Smoker    Packs/day: 0.50  . Smokeless tobacco: Never Used  . Alcohol use No     Allergies   Patient has no known allergies.   Review of Systems Review of Systems  All other systems reviewed and are negative.    Physical Exam Updated Vital Signs BP (!) 132/91   Pulse 81   Resp 16   SpO2 95%   Physical Exam  Constitutional: He is oriented to person, place, and time. He appears well-developed and well-nourished. No distress.  HENT:  Head: Normocephalic and atraumatic.  Mouth/Throat: Oropharynx is clear and moist.  Neck: Normal range of motion. Neck supple.  Cardiovascular: Normal rate and regular rhythm.  Exam reveals no friction rub.   No murmur heard. Pulmonary/Chest: Effort  normal and breath sounds normal. No respiratory distress. He has no wheezes. He has no rales.  Abdominal: Soft. Bowel sounds are normal. He exhibits no distension. There is no tenderness.  Musculoskeletal: Normal range of motion. He exhibits no edema.  Neurological: He is alert and oriented to person, place, and time. Coordination normal.  Skin: Skin is warm and dry. He is not diaphoretic.  Psychiatric: His speech is normal and behavior is normal. Cognition and memory are normal. He expresses impulsivity. He exhibits a depressed mood. He expresses suicidal  ideation. He expresses no suicidal plans.  Nursing note and vitals reviewed.    ED Treatments / Results  Labs (all labs ordered are listed, but only abnormal results are displayed) Labs Reviewed - No data to display  EKG  EKG Interpretation None       Radiology No results found.  Procedures Procedures (including critical care time)  Medications Ordered in ED Medications - No data to display   Initial Impression / Assessment and Plan / ED Course  I have reviewed the triage vital signs and the nursing notes.  Pertinent labs & imaging results that were available during my care of the patient were reviewed by me and considered in my medical decision making (see chart for details).  Patient presenting with depression and suicidal ideation. He will undergo evaluation by TTS will determine the final disposition.  Final Clinical Impressions(s) / ED Diagnoses   Final diagnoses:  None    New Prescriptions New Prescriptions   No medications on file     Geoffery Lyons, MD 11/10/16 [phone]

## 2016-11-10 NOTE — ED Notes (Signed)
Pt resting at present, no distress noted.  Monitoring for safety, q 15 min checks in effect.

## 2016-11-10 NOTE — ED Notes (Signed)
Bed: WA33 Expected date:  Expected time:  Means of arrival:  Comments: 

## 2016-11-10 NOTE — ED Triage Notes (Signed)
Pt reports suicidal thoughts x 3 weeks. He reports that he has been off his medication x5 weeks. He states that he feels worthless and hopeless. He cites "family, personal, and life problems" as triggers. Reports ahx of PTSD. Endorses marijuana, cocaine, and xanax use today. He states that his plan to kill himself is to "OD on whatever."

## 2016-11-11 DIAGNOSIS — F1290 Cannabis use, unspecified, uncomplicated: Secondary | ICD-10-CM

## 2016-11-11 DIAGNOSIS — F1414 Cocaine abuse with cocaine-induced mood disorder: Secondary | ICD-10-CM

## 2016-11-11 DIAGNOSIS — F1910 Other psychoactive substance abuse, uncomplicated: Secondary | ICD-10-CM

## 2016-11-11 DIAGNOSIS — F17210 Nicotine dependence, cigarettes, uncomplicated: Secondary | ICD-10-CM

## 2016-11-11 DIAGNOSIS — Z811 Family history of alcohol abuse and dependence: Secondary | ICD-10-CM

## 2016-11-11 DIAGNOSIS — R45851 Suicidal ideations: Secondary | ICD-10-CM

## 2016-11-11 LAB — URINALYSIS, ROUTINE W REFLEX MICROSCOPIC
Bilirubin Urine: NEGATIVE
GLUCOSE, UA: NEGATIVE mg/dL
Hgb urine dipstick: NEGATIVE
Ketones, ur: NEGATIVE mg/dL
LEUKOCYTES UA: NEGATIVE
Nitrite: NEGATIVE
PROTEIN: NEGATIVE mg/dL
SPECIFIC GRAVITY, URINE: 1.021 (ref 1.005–1.030)
pH: 7 (ref 5.0–8.0)

## 2016-11-11 LAB — RAPID URINE DRUG SCREEN, HOSP PERFORMED
AMPHETAMINES: NOT DETECTED
BENZODIAZEPINES: NOT DETECTED
Barbiturates: NOT DETECTED
Cocaine: POSITIVE — AB
OPIATES: NOT DETECTED
Tetrahydrocannabinol: POSITIVE — AB

## 2016-11-11 MED ORDER — TRAZODONE HCL 100 MG PO TABS: 100.0000 mg | ORAL_TABLET | Freq: Every evening | ORAL | 0 refills | Status: DC | PRN

## 2016-11-11 MED ORDER — ARIPIPRAZOLE 5 MG PO TABS: 5.0000 mg | ORAL_TABLET | Freq: Every day | ORAL | 0 refills | Status: DC

## 2016-11-11 MED ORDER — SERTRALINE HCL 50 MG PO TABS: 150.0000 mg | ORAL_TABLET | Freq: Every day | ORAL | 0 refills | Status: DC

## 2016-11-11 MED ORDER — HYDROXYZINE HCL 25 MG PO TABS: 25.0000 mg | ORAL_TABLET | Freq: Four times a day (QID) | ORAL | 0 refills | Status: DC | PRN

## 2016-11-11 NOTE — [facility] Suicide Risk Assessment (Signed)
Suicide Risk Assessment  Discharge Assessment   [facility] Discharge Suicide Risk Assessment   Principal Problem: Cocaine abuse with cocaine-induced mood disorder [facility]) Discharge Diagnoses:  Patient Active Problem List   Diagnosis Date Noted  . Cocaine abuse with cocaine-induced mood disorder [facility]) [F14.14] 09/08/2014    Priority: High  . 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 PTSD (post-traumatic stress disorder) [F43.10] 08/19/2016  . Right forearm injury [S59.911A] 08/19/2016  . Major depressive disorder, recurrent severe without psychotic features (HCC) [F33.2] 09/08/2014    Total Time spent with patient: 45 minutes  Musculoskeletal: Strength & Muscle Tone: within normal limits Gait & Station: normal Patient leans: N/A  Psychiatric Specialty Exam: Physical Exam  Constitutional: He is oriented to person, place, and time. He appears well-developed and well-nourished.  HENT:  Head: Normocephalic.  Respiratory: Effort normal.  Musculoskeletal: Normal range of motion.  Neurological: He is alert and oriented to person, place, and time.  Psychiatric: His speech is normal and behavior is normal. Judgment and thought content normal. Cognition and memory are normal. He exhibits a depressed mood.    Review of Systems  Psychiatric/Behavioral: Positive for depression and substance abuse.  All other systems reviewed and are negative.   Blood pressure (!) 143/103, pulse 62, temperature 98.3 F (36.8 C), temperature source Oral, resp. rate 16, SpO2 94 %.There is no height or weight on file to calculate BMI.  General Appearance: Casual  Eye Contact:  Good  Speech:  Normal Rate  Volume:  Normal  Mood:  Depressed, mild  Affect:  Congruent  Thought Process:  Coherent and Descriptions of Associations: Intact  Orientation:  Full (Time, Place, and Person)  Thought Content:  WDL and Logical  Suicidal Thoughts:  No  Homicidal Thoughts:  No  Memory:  Immediate;   Good Recent;    Good Remote;   Good  Judgement:  Fair  Insight:  Fair  Psychomotor Activity:  Normal  Concentration:  Concentration: Good and Attention Span: Good  Recall:  Good  Fund of Knowledge:  Fair  Language:  Good  Akathisia:  No  Handed:  Right  AIMS (if indicated):     Assets:  Leisure Time Physical Health Resilience  ADL's:  Intact  Cognition:  WNL  Sleep:       Mental Status Per Nursing Assessment::   On Admission:   cocaine abuse with suicidal ideations  Demographic Factors:  Male  Loss Factors: NA  Historical Factors: NA  Risk Reduction Factors:   Sense of responsibility to family and Positive social support  Continued Clinical Symptoms:  Depression, mild  Cognitive Features That Contribute To Risk:  None    Suicide Risk:  Minimal: No identifiable suicidal ideation.  Patients presenting with no risk factors but with morbid ruminations; may be classified as minimal risk based on the severity of the depressive symptoms    Plan Of Care/Follow-up recommendations:  Activity:  as tolerated Diet:  heart healthy diet  LORD, JAMISON, NP 11/11/2016, 10:14 AM

## 2016-11-11 NOTE — Consult Note (Signed)
[facility] Consult   Reason for Consult:  Cocaine abuse with suicidal ideations Referring Physician:  EDP Patient Identification: Jose Hays MRN:  [id number] Principal Diagnosis: Cocaine abuse with cocaine-induced mood disorder Jose [facility]) Diagnosis:   Patient Active Problem List   Diagnosis Date Noted  . Cocaine abuse with cocaine-induced mood disorder [facility]) [F14.14] 09/08/2014    Priority: High  . 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 PTSD (post-traumatic stress disorder) [F43.10] 08/19/2016  . Right forearm injury [S59.911A] 08/19/2016  . Major depressive disorder, recurrent severe without psychotic features ([place]) [F33.2] 09/08/2014    Total Time spent with patient: 45 minutes  Subjective:   Jose Hays is a 41 y.o. male patient does not warrant admission.  HPI:  41 yo male who came to the ED after abusing cocaine and having suicidal ideations.  He was restarted on his psychiatric medications and stabilized.  Jose Hays was recently at [facility] but did not follow-up with [facility] or his prescriptions.  Encouraged him to follow-up and [facility] could assist him with the cost of his medications.  No suicidal/homicidal ideations, hallucinations, or withdrawal symptoms.  STable for discharge.  Past Psychiatric History: cocaine abuse, depression, PTSD  Risk to Self: None Risk to Others: Homicidal Ideation: No Thoughts of Harm to Others: No ("not really") Current Homicidal Intent: No Current Homicidal Plan: No Access to Homicidal Means: No History of harm to others?: No Assessment of Violence: None Noted Does patient have access to weapons?: No Criminal Charges Pending?: No Does patient have a court date: No Prior Inpatient Therapy: Prior Inpatient Therapy: Yes Prior Therapy Dates: 2018 Prior Therapy Facilty/Provider(s): [facility], [place] Reason for Treatment: depression Prior Outpatient Therapy: Prior Outpatient Therapy: No Does patient have an ACCT  team?: No Does patient have Intensive In-House Services?  : No Does patient have [facility] services? : No Does patient have P4CC services?: No  Past Medical History:  Past Medical History:  Diagnosis Date  . Coronary artery disease   . Hypertension    History reviewed. No pertinent surgical history. Family History:  Family History  Problem Relation Age of Onset  . Alcohol abuse Father    Family Psychiatric  History: unknown Social History:  History  Alcohol Use No     History  Drug Use  . Types: Cocaine, Marijuana    Social History   Social History  . Marital status: Single    Spouse name: N/A  . Number of children: N/A  . Years of education: N/A   Social History Main Topics  . Smoking status: Current Every Day Smoker    Packs/day: 0.50  . Smokeless tobacco: Never Used  . Alcohol use No  . Drug use: Yes    Types: Cocaine, Marijuana  . Sexual activity: Not Asked   Other Topics Concern  . None   Social History Narrative  . None   Additional Social History:    Allergies:  No Known Allergies  Labs:  Results for orders placed or performed during the [facility] encounter of 11/10/16 (from the past 48 hour(s))  Urinalysis, Routine w reflex microscopic     Status: None   Collection Time: 11/10/16  5:24 AM  Result Value Ref Range   Color, Urine YELLOW YELLOW   APPearance CLEAR CLEAR   Specific Gravity, Urine 1.021 1.005 - 1.030   pH 7.0 5.0 - 8.0   Glucose, UA NEGATIVE NEGATIVE mg/dL   Hgb urine dipstick NEGATIVE NEGATIVE   Bilirubin Urine NEGATIVE NEGATIVE  Ketones, ur NEGATIVE NEGATIVE mg/dL   Protein, ur NEGATIVE NEGATIVE mg/dL   Nitrite NEGATIVE NEGATIVE   Leukocytes, UA NEGATIVE NEGATIVE  Urine rapid drug screen (hosp performed)     Status: Abnormal   Collection Time: 11/10/16  5:24 AM  Result Value Ref Range   Opiates NONE DETECTED NONE DETECTED   Cocaine POSITIVE (A) NONE DETECTED   Benzodiazepines NONE DETECTED NONE DETECTED   Amphetamines NONE  DETECTED NONE DETECTED   Tetrahydrocannabinol POSITIVE (A) NONE DETECTED   Barbiturates NONE DETECTED NONE DETECTED    Comment:        DRUG SCREEN FOR MEDICAL PURPOSES ONLY.  IF CONFIRMATION IS NEEDED FOR ANY PURPOSE, NOTIFY LAB WITHIN 5 DAYS.        LOWEST DETECTABLE LIMITS FOR URINE DRUG SCREEN Drug Class       Cutoff (ng/mL) Amphetamine      1000 Barbiturate      200 Benzodiazepine   295 Tricyclics       284 Opiates          300 Cocaine          300 THC              50   Basic metabolic panel     Status: Abnormal   Collection Time: 11/10/16  5:30 AM  Result Value Ref Range   Sodium 137 135 - 145 mmol/L   Potassium 4.2 3.5 - 5.1 mmol/L   Chloride 101 101 - 111 mmol/L   CO2 27 22 - 32 mmol/L   Glucose, Bld 105 (H) 65 - 99 mg/dL   BUN 9 6 - 20 mg/dL   Creatinine, Ser 1.19 0.61 - 1.24 mg/dL   Calcium 9.1 8.9 - 10.3 mg/dL   GFR calc non Af Amer >60 >60 mL/min   GFR calc Af Amer >60 >60 mL/min    Comment: (NOTE) The eGFR has been calculated using the CKD EPI equation. This calculation has not been validated in all clinical situations. eGFR's persistently <60 mL/min signify possible Chronic Kidney Disease.    Anion gap 9 5 - 15  Ethanol     Status: None   Collection Time: 11/10/16  5:30 AM  Result Value Ref Range   Alcohol, Ethyl (B) <5 <5 mg/dL    Comment:        LOWEST DETECTABLE LIMIT FOR SERUM ALCOHOL IS 5 mg/dL FOR MEDICAL PURPOSES ONLY   CBC with Differential     Status: Abnormal   Collection Time: 11/10/16  5:30 AM  Result Value Ref Range   WBC 11.2 (H) 4.0 - 10.5 K/uL   RBC 5.89 (H) 4.22 - 5.81 MIL/uL   Hemoglobin 16.0 13.0 - 17.0 g/dL   HCT 48.5 39.0 - 52.0 %   MCV 82.3 78.0 - 100.0 fL   MCH 27.2 26.0 - 34.0 pg   MCHC 33.0 30.0 - 36.0 g/dL   RDW 15.2 11.5 - 15.5 %   Platelets 190 150 - 400 K/uL   Neutrophils Relative % 71 %   Neutro Abs 7.9 (H) 1.7 - 7.7 K/uL   Lymphocytes Relative 21 %   Lymphs Abs 2.4 0.7 - 4.0 K/uL   Monocytes Relative 7 %    Monocytes Absolute 0.8 0.1 - 1.0 K/uL   Eosinophils Relative 1 %   Eosinophils Absolute 0.1 0.0 - 0.7 K/uL   Basophils Relative 0 %   Basophils Absolute 0.0 0.0 - 0.1 K/uL    Current Facility-Administered Medications  Medication Dose  Route Frequency Provider Last Rate Last Dose  . ARIPiprazole (ABILIFY) tablet 5 mg  5 mg Oral Daily Veryl Speak, MD   5 mg at 11/11/16 0957  . hydrOXYzine (ATARAX/VISTARIL) tablet 25 mg  25 mg Oral Q6H PRN Veryl Speak, MD   25 mg at 11/11/16 0552  . ibuprofen (ADVIL,MOTRIN) tablet 600 mg  600 mg Oral Q8H PRN Veryl Speak, MD      . ondansetron (ZOFRAN) tablet 4 mg  4 mg Oral Q8H PRN Veryl Speak, MD      . sertraline (ZOLOFT) tablet 150 mg  150 mg Oral Daily Veryl Speak, MD   150 mg at 11/11/16 0957  . traZODone (DESYREL) tablet 100 mg  100 mg Oral QHS PRN Veryl Speak, MD       Current Outpatient Prescriptions  Medication Sig Dispense Refill  . ARIPiprazole (ABILIFY) 5 MG tablet Take 1 tablet (5 mg total) by mouth daily. 30 tablet 0  . hydrOXYzine (ATARAX/VISTARIL) 25 MG tablet Take 1 tablet (25 mg total) by mouth every 6 (six) hours as needed for anxiety. 30 tablet 0  . ibuprofen (ADVIL,MOTRIN) 200 MG tablet Take 200-600 mg by mouth every 6 (six) hours as needed for moderate pain.    . nicotine (NICODERM CQ - DOSED IN MG/24 HOURS) 21 mg/24hr patch Place 1 patch (21 mg total) onto the skin daily. 28 patch 0  . omega-3 acid ethyl esters (LOVAZA) 1 g capsule Take 1 capsule (1 g total) by mouth 2 (two) times daily. 60 capsule 0  . sertraline (ZOLOFT) 50 MG tablet Take 3 tablets (150 mg total) by mouth daily. 90 tablet 0  . traZODone (DESYREL) 100 MG tablet Take 1 tablet (100 mg total) by mouth at bedtime as needed for sleep. 30 tablet 0    Musculoskeletal: Strength & Muscle Tone: within normal limits Gait & Station: normal Patient leans: N/A  Psychiatric Specialty Exam: Physical Exam  Constitutional: He is oriented to person, place, and time. He  appears well-developed and well-nourished.  HENT:  Head: Normocephalic.  Respiratory: Effort normal.  Musculoskeletal: Normal range of motion.  Neurological: He is alert and oriented to person, place, and time.  Psychiatric: His speech is normal and behavior is normal. Judgment and thought content normal. Cognition and memory are normal. He exhibits a depressed mood.    Review of Systems  Psychiatric/Behavioral: Positive for depression and substance abuse.  All other systems reviewed and are negative.   Blood pressure (!) 143/103, pulse 62, temperature 98.3 F (36.8 C), temperature source Oral, resp. rate 16, SpO2 94 %.There is no height or weight on file to calculate BMI.  General Appearance: Casual  Eye Contact:  Good  Speech:  Normal Rate  Volume:  Normal  Mood:  Depressed, mild  Affect:  Congruent  Thought Process:  Coherent and Descriptions of Associations: Intact  Orientation:  Full (Time, Place, and Person)  Thought Content:  WDL and Logical  Suicidal Thoughts:  No  Homicidal Thoughts:  No  Memory:  Immediate;   Good Recent;   Good Remote;   Good  Judgement:  Fair  Insight:  Fair  Psychomotor Activity:  Normal  Concentration:  Concentration: Good and Attention Span: Good  Recall:  Good  Fund of Knowledge:  Fair  Language:  Good  Akathisia:  No  Handed:  Right  AIMS (if indicated):     Assets:  Leisure Time Physical Health Resilience  ADL's:  Intact  Cognition:  WNL  Sleep:  Treatment Plan Summary: Daily contact with patient to assess and evaluate symptoms and progress in treatment, Medication management and Plan cocaine abuse with cocaine induced mood disorder:  -Crisis stabilization -Medication management:  Restarted his Abilify 5 mg daily for mood along with Zoloft 150 mg daily, Vistaril 25 mg every six hours PRN anxiety, and Trazodone 100 mg at bedtime PRN sleep -Individual counseling -Outpatient resources -Peer support referral   Disposition: No  evidence of imminent risk to self or others at present.    Waylan Boga, NP 11/11/2016 10:09 AM  Patient seen face-to-face for psychiatric evaluation, chart reviewed and case discussed with the physician extender and developed treatment plan. Reviewed the information documented and agree with the treatment plan. Corena Pilgrim, MD

## 2016-11-11 NOTE — Discharge Instructions (Signed)
For your behavioral health needs, you are advised to follow up with [facility].  Contact them at your earliest opportunity to ask about enrolling in their program:   [facility]  [address] [place], [place] [address]  [phone]

## 2016-11-11 NOTE — ED Notes (Signed)
Pt discharged home. Discharged instructions read to pt, but he was angry about being discharged and not interested in paying attention. Prescriptions given to pt as well.  All belongings returned to pt who signed for same.

## 2016-11-11 NOTE — BH Assessment (Signed)
[facility] Assessment Progress Note  Per Thedore Mins, MD, this pt does not require psychiatric hospitalization at this time.  Pt is to be discharged from [facility] with referral information for [facility] in [place].  This has been included in pt's discharge instructions.  Pt's nurse, Diane, has been notified.  Doylene Canning, MA Triage Specialist [phone]

## 2017-06-17 ENCOUNTER — Other Ambulatory Visit: Payer: Self-pay

## 2017-06-17 ENCOUNTER — Encounter (HOSPITAL_COMMUNITY): Payer: Self-pay | Admitting: Emergency Medicine

## 2017-06-17 ENCOUNTER — Emergency Department (HOSPITAL_COMMUNITY)
Admission: EM | Admit: 2017-06-17 | Discharge: 2017-06-18 | Disposition: A | Discharge status: Other Acute Inpatient Hospital | Payer: Self-pay | Attending: Emergency Medicine | Admitting: Emergency Medicine

## 2017-06-17 DIAGNOSIS — R45851 Suicidal ideations: Secondary | ICD-10-CM | POA: Insufficient documentation

## 2017-06-17 DIAGNOSIS — F17210 Nicotine dependence, cigarettes, uncomplicated: Secondary | ICD-10-CM | POA: Insufficient documentation

## 2017-06-17 DIAGNOSIS — I10 Essential (primary) hypertension: Secondary | ICD-10-CM | POA: Insufficient documentation

## 2017-06-17 DIAGNOSIS — R4589 Other symptoms and signs involving emotional state: Secondary | ICD-10-CM

## 2017-06-17 DIAGNOSIS — F332 Major depressive disorder, recurrent severe without psychotic features: Secondary | ICD-10-CM | POA: Insufficient documentation

## 2017-06-17 DIAGNOSIS — Z79899 Other long term (current) drug therapy: Secondary | ICD-10-CM | POA: Insufficient documentation

## 2017-06-17 LAB — COMPREHENSIVE METABOLIC PANEL
ALK PHOS: 64 U/L (ref 38–126)
ALT: 17 U/L (ref 17–63)
ANION GAP: 9 (ref 5–15)
AST: 14 U/L — ABNORMAL LOW (ref 15–41)
Albumin: 4.1 g/dL (ref 3.5–5.0)
BILIRUBIN TOTAL: 1.3 mg/dL — AB (ref 0.3–1.2)
BUN: 10 mg/dL (ref 6–20)
CALCIUM: 9.4 mg/dL (ref 8.9–10.3)
CO2: 22 mmol/L (ref 22–32)
Chloride: 107 mmol/L (ref 101–111)
Creatinine, Ser: 0.92 mg/dL (ref 0.61–1.24)
GLUCOSE: 96 mg/dL (ref 65–99)
Potassium: 4.1 mmol/L (ref 3.5–5.1)
Sodium: 138 mmol/L (ref 135–145)
TOTAL PROTEIN: 8.3 g/dL — AB (ref 6.5–8.1)

## 2017-06-17 LAB — CBC
HCT: 48.4 % (ref 39.0–52.0)
Hemoglobin: 16.1 g/dL (ref 13.0–17.0)
MCH: 27.1 pg (ref 26.0–34.0)
MCHC: 33.3 g/dL (ref 30.0–36.0)
MCV: 81.5 fL (ref 78.0–100.0)
PLATELETS: 211 10*3/uL (ref 150–400)
RBC: 5.94 MIL/uL — ABNORMAL HIGH (ref 4.22–5.81)
RDW: 14.9 % (ref 11.5–15.5)
WBC: 10.4 10*3/uL (ref 4.0–10.5)

## 2017-06-17 LAB — ETHANOL

## 2017-06-17 LAB — SALICYLATE LEVEL: Salicylate Lvl: <7 mg/dL (ref 2.8–30.0)

## 2017-06-17 LAB — ACETAMINOPHEN LEVEL

## 2017-06-17 MED ORDER — HYDROXYZINE HCL 25 MG PO TABS: 25.0000 mg | ORAL_TABLET | Freq: Four times a day (QID) | ORAL | Status: DC | PRN

## 2017-06-17 MED ORDER — NICOTINE 21 MG/24HR TD PT24: 21.0000 mg | MEDICATED_PATCH | Freq: Every day | TRANSDERMAL | Status: DC

## 2017-06-17 MED ORDER — IBUPROFEN 200 MG PO TABS: 200.0000 mg | ORAL_TABLET | Freq: Four times a day (QID) | ORAL | Status: DC | PRN

## 2017-06-17 NOTE — ED Notes (Signed)
Report called to RN Phineas Semen, [facility].  Pending [facility] transport.

## 2017-06-17 NOTE — ED Provider Notes (Signed)
[facility]-EMERGENCY DEPT Provider Note   CSN: [id number] Arrival date & time: 06/17/17  2023     History   Chief Complaint Chief Complaint  Patient presents with  . Suicidal    HPI Jose Hays is a 42 y.o. male.  The history is provided by the patient and medical records. No language interpreter was used.   Jose Hays is a 42 y.o. male  with a PMH of HTN, CAD, PTSD, substance abuse who presents to the Emergency Department complaining of hopelessness over the last week.  He states that he has thought about taking his friend's gun and shooting himself in the head. He states that he has a daughter and does not want her to see him in his current condition, prompting him to come to the ER for help. Endorses prior suicide attempt.  He denies any homicidal ideation, auditory or visual hallucination.  He reports being on multiple psychiatric medications, however has not taken them since he left the hospital last time which was nearly a year ago.  He does report homelessness as well, contributing to his situation.   Past Medical History:  Diagnosis Date  . Coronary artery disease   . Hypertension     Patient Active Problem List   Diagnosis Date Noted  . Cocaine use disorder, mild, abuse (HCC) 08/19/2016  . PTSD (post-traumatic stress disorder) 08/19/2016  . Right forearm injury 08/19/2016  . Major depressive disorder, recurrent severe without psychotic features ([facility]) 09/08/2014  . Cocaine abuse with cocaine-induced mood disorder (HCC) 09/08/2014    History reviewed. No pertinent surgical history.      Home Medications    Prior to Admission medications   Medication Sig Start Date End Date Taking? Authorizing Provider  ibuprofen (ADVIL,MOTRIN) 200 MG tablet Take 200-600 mg by mouth every 6 (six) hours as needed for moderate pain.   Yes [provider]  naproxen sodium (ALEVE) 220 MG tablet Take 220 mg by mouth 2 (two) times daily as needed  (pain).   Yes [provider]  ARIPiprazole (ABILIFY) 5 MG tablet Take 1 tablet (5 mg total) by mouth daily. Patient not taking: Reported on 06/17/2017 11/11/16   Charm Rings, NP  hydrOXYzine (ATARAX/VISTARIL) 25 MG tablet Take 1 tablet (25 mg total) by mouth every 6 (six) hours as needed for anxiety. Patient not taking: Reported on 06/17/2017 11/11/16   Charm Rings, NP  nicotine (NICODERM CQ - DOSED IN MG/24 HOURS) 21 mg/24hr patch Place 1 patch (21 mg total) onto the skin daily. Patient not taking: Reported on 06/17/2017 08/29/16   Denzil Magnuson, NP  omega-3 acid ethyl esters (LOVAZA) 1 g capsule Take 1 capsule (1 g total) by mouth 2 (two) times daily. Patient not taking: Reported on 06/17/2017 08/28/16   Denzil Magnuson, NP  sertraline (ZOLOFT) 50 MG tablet Take 3 tablets (150 mg total) by mouth daily. Patient not taking: Reported on 06/17/2017 11/11/16   Charm Rings, NP  traZODone (DESYREL) 100 MG tablet Take 1 tablet (100 mg total) by mouth at bedtime as needed for sleep. Patient not taking: Reported on 06/17/2017 11/11/16   Charm Rings, NP    Family History Family History  Problem Relation Age of Onset  . Alcohol abuse Father     Social History Social History   Tobacco Use  . Smoking status: Current Every Day Smoker    Packs/day: 0.50  . Smokeless tobacco: Never Used  Substance Use Topics  . Alcohol use:  No  . Drug use: Yes    Types: Cocaine, Marijuana     Allergies   Patient has no known allergies.   Review of Systems Review of Systems  Psychiatric/Behavioral: Positive for suicidal ideas.       + hopelessness  All other systems reviewed and are negative.    Physical Exam Updated Vital Signs BP (!) 146/100 (BP Location: Left Arm)   Pulse 83   Temp 97.9 F (36.6 C) (Oral)   Resp 18   Ht  (1.88 m)   Wt (!) 158.8 kg (350 lb)   SpO2 95%   BMI 44.94 kg/m   Physical Exam  Constitutional: He is oriented to person, place, and time. He appears  well-developed and well-nourished. No distress.  HENT:  Head: Normocephalic and atraumatic.  Neck: Neck supple.  Cardiovascular: Normal rate, regular rhythm and normal heart sounds.  No murmur heard. Pulmonary/Chest: Effort normal and breath sounds normal. No respiratory distress.  Abdominal: Soft. He exhibits no distension. There is no tenderness.  Neurological: He is alert and oriented to person, place, and time.  Skin: Skin is warm and dry.  Nursing note and vitals reviewed.   ED Treatments / Results  Labs (all labs ordered are listed, but only abnormal results are displayed) Labs Reviewed  COMPREHENSIVE METABOLIC PANEL  ETHANOL  SALICYLATE LEVEL  ACETAMINOPHEN LEVEL  CBC  RAPID URINE DRUG SCREEN, HOSP PERFORMED    EKG None  Radiology No results found.  Procedures Procedures (including critical care time)  Medications Ordered in ED Medications - No data to display   Initial Impression / Assessment and Plan / ED Course  I have reviewed the triage vital signs and the nursing notes.  Pertinent labs & imaging results that were available during my care of the patient were reviewed by me and considered in my medical decision making (see chart for details).    Jose Hays is a 42 y.o. male who presents to ED for feelings of hopelessness and suicidal thoughts with plan to shoot himself with a friends gun. Labs reviewed. Medically cleared with disposition per TTS recommendations.   Final Clinical Impressions(s) / ED Diagnoses   Final diagnoses:  Hopelessness    ED Discharge Orders    None       Shakeda Pearse, Chase Picket, PA-C 06/17/17 2329    Marily Memos, MD 06/18/17 0021

## 2017-06-17 NOTE — Progress Notes (Signed)
Patient accepted to [facility], room 407-1. Services of Dr. Jama Flavors, MD. Please call report to [phone]. Rosey Bath, RN

## 2017-06-17 NOTE — ED Notes (Signed)
TTS being done at this time.  

## 2017-06-17 NOTE — ED Triage Notes (Signed)
Pt reports that he has been feeling weak and has been feeling hopeless and has wanted to hurt himself by shotting himself in the head.
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
## 2017-06-17 NOTE — ED Notes (Signed)
Pt. Belongings consist of: 2 bags of clothes (pants, jacket, shirt), small grey bag with hair clippers, and black cell phone. Pt. Belongings have been placed in locker 34.

## 2017-06-17 NOTE — ED Notes (Signed)
Pt presents with SI, feeling hopeless,plan to shoot self in the head.  Pt reports he is homeless, living in his car.  Denies HI or AVH.  A&O x 3, no distress noted, calm & cooperative.  Monitoring for safety, Q 15 min checks in effect.  Pt reports he suffers from PTSD and  Relocated to area with no family support.

## 2017-06-17 NOTE — ED Notes (Signed)
Bed: [facility]4 Expected date:  Expected time:  Means of arrival:  Comments: 

## 2017-06-18 ENCOUNTER — Inpatient Hospital Stay (HOSPITAL_COMMUNITY)
Admission: AD | Admit: 2017-06-18 | Discharge: 2017-06-20 | DRG: 389 | Disposition: A | Discharge status: Other Acute Inpatient Hospital | Payer: No Typology Code available for payment source | Source: Ambulatory Visit | Attending: Internal Medicine | Admitting: Internal Medicine

## 2017-06-18 ENCOUNTER — Encounter (HOSPITAL_COMMUNITY): Payer: Self-pay

## 2017-06-18 ENCOUNTER — Inpatient Hospital Stay (HOSPITAL_COMMUNITY): Payer: Self-pay

## 2017-06-18 ENCOUNTER — Inpatient Hospital Stay (HOSPITAL_COMMUNITY)
Admission: AD | Admit: 2017-06-18 | Discharge: 2017-06-18 | DRG: 885 | Disposition: A | Discharge status: Other Acute Inpatient Hospital | Payer: Self-pay | Source: Intra-hospital | Attending: Internal Medicine | Admitting: Internal Medicine

## 2017-06-18 DIAGNOSIS — F332 Major depressive disorder, recurrent severe without psychotic features: Secondary | ICD-10-CM | POA: Diagnosis present

## 2017-06-18 DIAGNOSIS — I10 Essential (primary) hypertension: Secondary | ICD-10-CM | POA: Diagnosis present

## 2017-06-18 DIAGNOSIS — Z6841 Body Mass Index (BMI) 40.0 and over, adult: Secondary | ICD-10-CM

## 2017-06-18 DIAGNOSIS — Z8679 Personal history of other diseases of the circulatory system: Secondary | ICD-10-CM

## 2017-06-18 DIAGNOSIS — F419 Anxiety disorder, unspecified: Secondary | ICD-10-CM | POA: Diagnosis present

## 2017-06-18 DIAGNOSIS — D72829 Elevated white blood cell count, unspecified: Secondary | ICD-10-CM | POA: Diagnosis present

## 2017-06-18 DIAGNOSIS — F4310 Post-traumatic stress disorder, unspecified: Secondary | ICD-10-CM | POA: Diagnosis present

## 2017-06-18 DIAGNOSIS — Z79899 Other long term (current) drug therapy: Secondary | ICD-10-CM

## 2017-06-18 DIAGNOSIS — Z811 Family history of alcohol abuse and dependence: Secondary | ICD-10-CM

## 2017-06-18 DIAGNOSIS — Z9114 Patient's other noncompliance with medication regimen: Secondary | ICD-10-CM

## 2017-06-18 DIAGNOSIS — Z5900 Homelessness unspecified: Secondary | ICD-10-CM

## 2017-06-18 DIAGNOSIS — F1410 Cocaine abuse, uncomplicated: Secondary | ICD-10-CM | POA: Diagnosis present

## 2017-06-18 DIAGNOSIS — K56600 Partial intestinal obstruction, unspecified as to cause: Principal | ICD-10-CM | POA: Diagnosis present

## 2017-06-18 DIAGNOSIS — Z915 Personal history of self-harm: Secondary | ICD-10-CM

## 2017-06-18 DIAGNOSIS — Z62810 Personal history of physical and sexual abuse in childhood: Secondary | ICD-10-CM | POA: Diagnosis present

## 2017-06-18 DIAGNOSIS — G4700 Insomnia, unspecified: Secondary | ICD-10-CM | POA: Diagnosis present

## 2017-06-18 DIAGNOSIS — R45851 Suicidal ideations: Secondary | ICD-10-CM

## 2017-06-18 DIAGNOSIS — R450 Nervousness: Secondary | ICD-10-CM

## 2017-06-18 DIAGNOSIS — K56609 Unspecified intestinal obstruction, unspecified as to partial versus complete obstruction: Secondary | ICD-10-CM | POA: Diagnosis present

## 2017-06-18 DIAGNOSIS — Z62811 Personal history of psychological abuse in childhood: Secondary | ICD-10-CM | POA: Diagnosis present

## 2017-06-18 DIAGNOSIS — F17210 Nicotine dependence, cigarettes, uncomplicated: Secondary | ICD-10-CM | POA: Diagnosis present

## 2017-06-18 DIAGNOSIS — I2510 Atherosclerotic heart disease of native coronary artery without angina pectoris: Secondary | ICD-10-CM | POA: Diagnosis present

## 2017-06-18 DIAGNOSIS — F1414 Cocaine abuse with cocaine-induced mood disorder: Secondary | ICD-10-CM | POA: Diagnosis present

## 2017-06-18 DIAGNOSIS — Z638 Other specified problems related to primary support group: Secondary | ICD-10-CM

## 2017-06-18 DIAGNOSIS — R1084 Generalized abdominal pain: Secondary | ICD-10-CM

## 2017-06-18 DIAGNOSIS — Z8659 Personal history of other mental and behavioral disorders: Secondary | ICD-10-CM

## 2017-06-18 LAB — CBC WITH DIFFERENTIAL/PLATELET
BASOS ABS: 0 10*3/uL (ref 0.0–0.1)
BASOS PCT: 0 %
EOS PCT: 1 %
Eosinophils Absolute: 0.1 10*3/uL (ref 0.0–0.7)
HCT: 50.7 % (ref 39.0–52.0)
Hemoglobin: 16.6 g/dL (ref 13.0–17.0)
LYMPHS PCT: 8 %
Lymphs Abs: 1.1 10*3/uL (ref 0.7–4.0)
MCH: 26.7 pg (ref 26.0–34.0)
MCHC: 32.7 g/dL (ref 30.0–36.0)
MCV: 81.5 fL (ref 78.0–100.0)
MONO ABS: 0.9 10*3/uL (ref 0.1–1.0)
MONOS PCT: 7 %
Neutro Abs: 11.1 10*3/uL — ABNORMAL HIGH (ref 1.7–7.7)
Neutrophils Relative %: 84 %
PLATELETS: 214 10*3/uL (ref 150–400)
RBC: 6.22 MIL/uL — ABNORMAL HIGH (ref 4.22–5.81)
RDW: 15 % (ref 11.5–15.5)
WBC: 13.1 10*3/uL — ABNORMAL HIGH (ref 4.0–10.5)

## 2017-06-18 LAB — LIPASE, BLOOD: Lipase: 25 U/L (ref 11–51)

## 2017-06-18 LAB — URINALYSIS, ROUTINE W REFLEX MICROSCOPIC
BACTERIA UA: NONE SEEN
BILIRUBIN URINE: NEGATIVE
Glucose, UA: NEGATIVE mg/dL
Hgb urine dipstick: NEGATIVE
Ketones, ur: 5 mg/dL — AB
Leukocytes, UA: NEGATIVE
NITRITE: NEGATIVE
PH: 6 (ref 5.0–8.0)
Protein, ur: 30 mg/dL — AB
SPECIFIC GRAVITY, URINE: 1.032 — AB (ref 1.005–1.030)

## 2017-06-18 LAB — COMPREHENSIVE METABOLIC PANEL
ALK PHOS: 67 U/L (ref 38–126)
ALT: 17 U/L (ref 17–63)
ANION GAP: 11 (ref 5–15)
AST: 16 U/L (ref 15–41)
Albumin: 4.1 g/dL (ref 3.5–5.0)
BUN: 9 mg/dL (ref 6–20)
CALCIUM: 9.3 mg/dL (ref 8.9–10.3)
CO2: 25 mmol/L (ref 22–32)
Chloride: 101 mmol/L (ref 101–111)
Creatinine, Ser: 1.01 mg/dL (ref 0.61–1.24)
GFR calc non Af Amer: >60 mL/min (ref >60)
Glucose, Bld: 102 mg/dL — ABNORMAL HIGH (ref 65–99)
Potassium: 3.8 mmol/L (ref 3.5–5.1)
SODIUM: 137 mmol/L (ref 135–145)
Total Bilirubin: 1.3 mg/dL — ABNORMAL HIGH (ref 0.3–1.2)
Total Protein: 8.3 g/dL — ABNORMAL HIGH (ref 6.5–8.1)

## 2017-06-18 MED ORDER — ONDANSETRON HCL 4 MG PO TABS: 4.0000 mg | ORAL_TABLET | Freq: Four times a day (QID) | ORAL | Status: DC | PRN

## 2017-06-18 MED ORDER — MORPHINE SULFATE (PF) 2 MG/ML IV SOLN: 2.0000 mg | INTRAVENOUS | Status: DC | PRN

## 2017-06-18 MED ORDER — KETOROLAC TROMETHAMINE 15 MG/ML IJ SOLN
15.0000 mg | Freq: Once | INTRAMUSCULAR | Status: AC
  Administered 2017-06-18: 15 mg via INTRAVENOUS
  Filled 2017-06-18: qty 1

## 2017-06-18 MED ORDER — ACETAMINOPHEN 325 MG PO TABS
650.0000 mg | ORAL_TABLET | Freq: Four times a day (QID) | ORAL | Status: DC | PRN
Start: 2017-06-18 — End: 2017-06-18

## 2017-06-18 MED ORDER — IOPAMIDOL (ISOVUE-300) INJECTION 61%
INTRAVENOUS | Status: AC
  Filled 2017-06-18: qty 100

## 2017-06-18 MED ORDER — ONDANSETRON HCL 4 MG/2ML IJ SOLN
4.0000 mg | Freq: Four times a day (QID) | INTRAMUSCULAR | Status: DC | PRN
  Administered 2017-06-18: 4 mg via INTRAVENOUS
  Filled 2017-06-18: qty 2

## 2017-06-18 MED ORDER — ENOXAPARIN SODIUM 80 MG/0.8ML ~~LOC~~ SOLN: 80.0000 mg | SUBCUTANEOUS | Status: DC

## 2017-06-18 MED ORDER — MORPHINE SULFATE (PF) 2 MG/ML IV SOLN
2.0000 mg | INTRAVENOUS | Status: DC | PRN
  Administered 2017-06-18 – 2017-06-19 (×2): 2 mg via INTRAVENOUS
  Filled 2017-06-18 (×2): qty 1

## 2017-06-18 MED ORDER — TRAZODONE HCL 100 MG PO TABS
100.0000 mg | ORAL_TABLET | Freq: Every evening | ORAL | Status: DC | PRN
  Filled 2017-06-18 (×2): qty 1

## 2017-06-18 MED ORDER — ALUM & MAG HYDROXIDE-SIMETH 200-200-20 MG/5ML PO SUSP: 30.0000 mL | ORAL | Status: DC | PRN

## 2017-06-18 MED ORDER — SODIUM CHLORIDE 0.9 % IV SOLN
INTRAVENOUS | Status: DC
  Administered 2017-06-18 – 2017-06-19 (×2): via INTRAVENOUS

## 2017-06-18 MED ORDER — MORPHINE SULFATE (PF) 4 MG/ML IV SOLN
4.0000 mg | Freq: Once | INTRAVENOUS | Status: AC
  Administered 2017-06-18: 4 mg via INTRAVENOUS
  Filled 2017-06-18: qty 1

## 2017-06-18 MED ORDER — HYDROXYZINE HCL 25 MG PO TABS: 25.0000 mg | ORAL_TABLET | Freq: Four times a day (QID) | ORAL | Status: DC | PRN

## 2017-06-18 MED ORDER — ONDANSETRON HCL 4 MG/2ML IJ SOLN: 4.0000 mg | Freq: Four times a day (QID) | INTRAMUSCULAR | Status: DC | PRN

## 2017-06-18 MED ORDER — GI COCKTAIL ~~LOC~~
30.0000 mL | Freq: Once | ORAL | Status: AC
  Administered 2017-06-18: 30 mL via ORAL
  Filled 2017-06-18: qty 30

## 2017-06-18 MED ORDER — IOPAMIDOL (ISOVUE-300) INJECTION 61%
100.0000 mL | Freq: Once | INTRAVENOUS | Status: AC | PRN
  Administered 2017-06-18: 100 mL via INTRAVENOUS

## 2017-06-18 MED ORDER — MAGNESIUM HYDROXIDE 400 MG/5ML PO SUSP: 30.0000 mL | Freq: Every day | ORAL | Status: DC | PRN

## 2017-06-18 MED ORDER — SODIUM CHLORIDE 0.9 % IV SOLN
INTRAVENOUS | Status: DC
  Administered 2017-06-18: 18:00:00 via INTRAVENOUS

## 2017-06-18 NOTE — ED Triage Notes (Signed)
From [facility] with c/o LLQ abd pain x 2days with diarrhea x3days. Burping relieves pain. SI (tech in room). Denies N/V. Vitals stable en route.  

## 2017-06-18 NOTE — H&P (Signed)
History and Physical  Jose Hays WUJ:[id number] DOB: May 13, 1975 DOA: 06/18/2017  Referring physician: Dr. Rodena Medin, ER physician PCP: Patient, No Pcp Per  Outpatient Specialists: Nehemiah Massed, psychiatry Patient coming from: [facility] & is able to ambulate without assistance  Chief Complaint: Abdominal pain  HPI: Jose Hays is a 42 y.o. male with medical history significant for morbid obesity, homelessness and cocaine abuse who was admitted at behavioral health Hospital 1 day ago for suicidal ideation.  He was sent over to [facility] long today after continued complaints of lower quadrant abdominal pain.  Patient's lab work noteworthy for mild leukocytosis of 13 although no infection noted.  CT scan of his abdomen and pelvis noted for distal small bowel obstruction with transition zone noted in the right lower quadrant.  Interestingly, patient has never had prior abdominal surgery.  With these findings, it was felt best that he come in for further evaluation.  General surgery was consulted at this time, no surgical intervention needed.   Review of Systems: Patient seen in the emergency room. Pt complains of abdominal pain mostly in the lower quadrants below his umbilicus with some radiation left and right.  Does not want to talk very much but pt denies any headaches, chest pain, shortness of breath, arm or leg pain.  Review of systems are otherwise negative   Past Medical History:  Diagnosis Date  . Coronary artery disease   . Hypertension    No past surgical history on file.  Social History:  reports that he has been smoking.  He has been smoking about 0.25 packs per day. He has never used smokeless tobacco. He reports that he has current or past drug history. Drugs: Cocaine and Marijuana. He reports that he does not drink alcohol.  Patient is homeless.  Ambulates without assistance.  Actively uses cigarettes, cocaine and marijuana.  Says that he does not drink  alcohol   No Known Allergies  Family History  Problem Relation Age of Onset  . Alcohol abuse Father       Prior to Admission medications   Medication Sig Start Date End Date Taking? Authorizing Provider  hydrOXYzine (ATARAX/VISTARIL) 25 MG tablet Take 1 tablet (25 mg total) by mouth every 6 (six) hours as needed for anxiety. Patient not taking: Reported on 06/17/2017 11/11/16   Charm Rings, NP  ibuprofen (ADVIL,MOTRIN) 200 MG tablet Take 200-600 mg by mouth every 6 (six) hours as needed for moderate pain.    [provider]  naproxen sodium (ALEVE) 220 MG tablet Take 220 mg by mouth 2 (two) times daily as needed (pain).    [provider]  nicotine (NICODERM CQ - DOSED IN MG/24 HOURS) 21 mg/24hr patch Place 1 patch (21 mg total) onto the skin daily. Patient not taking: Reported on 06/17/2017 08/29/16   Denzil Magnuson, NP    Physical Exam: There were no vitals taken for this visit.  General: Alert and oriented x3, no acute distress Eyes: Sclera nonicteric, extraocular movements are intact ENT: Normocephalic, atraumatic, mucous memories are dry Neck: Supple, no JVD Cardiovascular: Regular rate and rhythm, S1-S2 Respiratory: Clear to auscultation bilaterally Abdomen: Soft, mild nonspecific tenderness in the lower quadrants, nondistended, few bowel sounds Skin: No skin breaks, tears or lesions Musculoskeletal: No clubbing or cyanosis or edema Psychiatric: Patient is appropriate and does not appear to have any evidence of acute psychosis.  Discussed with surgery and he is still felt to be risk for suicidal ideation Neurologic: No focal deficits  Labs on Admission:  Basic Metabolic Panel: Recent Labs  Lab 06/17/17 2227 06/18/17 1251  NA 138 137  K 4.1 3.8  CL 107 101  CO2 22 25  GLUCOSE 96 102*  BUN 10 9  CREATININE 0.92 1.01  CALCIUM 9.4 9.3   Liver Function Tests: Recent Labs  Lab 06/17/17 2227 06/18/17 1251  AST 14* 16  ALT 17 17   ALKPHOS 64 67  BILITOT 1.3* 1.3*  PROT 8.3* 8.3*  ALBUMIN 4.1 4.1   Recent Labs  Lab 06/18/17 1251  LIPASE 25   No results for input(s): AMMONIA in the last 168 hours. CBC: Recent Labs  Lab 06/17/17 2227 06/18/17 1251  WBC 10.4 13.1*  NEUTROABS  --  11.1*  HGB 16.1 16.6  HCT 48.4 50.7  MCV 81.5 81.5  PLT 211 214   Cardiac Enzymes: No results for input(s): CKTOTAL, CKMB, CKMBINDEX, TROPONINI in the last 168 hours.  BNP (last 3 results) No results for input(s): BNP in the last 8760 hours.  ProBNP (last 3 results) No results for input(s): PROBNP in the last 8760 hours.  CBG: No results for input(s): GLUCAP in the last 168 hours.  Radiological Exams on Admission: Ct Abdomen Pelvis W Contrast  Result Date: 06/18/2017 CLINICAL DATA:  Acute left lower quadrant abdominal pain. EXAM: CT ABDOMEN AND PELVIS WITH CONTRAST TECHNIQUE: Multidetector CT imaging of the abdomen and pelvis was performed using the standard protocol following bolus administration of intravenous contrast. CONTRAST:  ISOVUE-300 IOPAMIDOL (ISOVUE-300) INJECTION 61% COMPARISON:  None. FINDINGS: Lower chest: No acute abnormality. Hepatobiliary: No focal liver abnormality is seen. No gallstones, gallbladder wall thickening, or biliary dilatation. Pancreas: Unremarkable. No pancreatic ductal dilatation or surrounding inflammatory changes. Spleen: Normal in size without focal abnormality. Adrenals/Urinary Tract: Adrenal glands are unremarkable. Kidneys are normal, without renal calculi, focal lesion, or hydronephrosis. Bladder is unremarkable. Stomach/Bowel: The appendix appears normal. Moderate gastric distention is noted. Diffuse small bowel dilatation is noted with transition zone seen in the right lower quadrant best seen on image number 69 of series 2. Vascular/Lymphatic: No significant vascular findings are present. No enlarged abdominal or pelvic lymph nodes. Reproductive: Prostate is unremarkable. Other: No  abdominal wall hernia or abnormality. No abdominopelvic ascites. Musculoskeletal: No acute or significant osseous findings. IMPRESSION: Small bowel dilatation is noted concerning for distal small bowel obstruction. Transition zone is noted in the right lower quadrant of uncertain etiology. Electronically Signed   By: Lupita Raider, M.D.   On: 06/18/2017 14:29    EKG: Not done    Assessment/Plan Present on Admission: . Morbid obesity (HCC) . MDD (major depressive disorder), recurrent episode, severe (HCC) . Cocaine abuse with cocaine-induced mood disorder (HCC) . SBO (small bowel obstruction) (HCC)  Principal Problem:   SBO (small bowel obstruction) (HCC): Hopefully mild.  Patient with no previous history of any abdominal surgery.  General surgery consulted and they will follow as well.  Check acute abdominal series in the morning and start clear liquids. Active Problems:   Cocaine abuse with cocaine-induced mood disorder (HCC)    Morbid obesity [facility]): Patient meets criteria with BMI greater than 40   Suicidal ideation in patient with major depressive disorder, recurrent episode, severe: 24-hour sitter.  Return back to behavioral health once bowel obstruction resolved.  I discussed with psychiatry and patient is still felt to be suicidal   Homeless.  Leukocytosis: Mild.  May be stress margination from small bowel obstruction?  No signs of infection.  No fever.  Recheck labs in the morning.  DVT prophylaxis: Lovenox  Code Status: Full code  Family Communication: No family  Disposition Plan: If x-ray shows resolution of bowel obstruction, patient may be able to be discharged back to behavioral health  Consults called: General surgery-[facility] Psychiatry-[place] aware of patient's hospitalization  Admission status: Given potential transfer discharge tomorrow, have placed under observation    Hollice Espy MD [facility] Pager [phone]  If 7PM-7AM, please  contact night-coverage [email or web address] Password [email or web address]  06/18/2017, 5:08 PM
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
## 2017-06-18 NOTE — ED Notes (Signed)
Pt has called out several times asking for a doctor. I told him that they are making their rounds and working very hard to get to him. He states, "I'm trying very hard to be cordial, I really am, but I came EMS so shouldn't ya'll be ready for me?" Pt has been very abrasive and irritable, but has NOT been verbally or physically abusive at this point and efforts have been made to make him comfortable other than using medication. He;s been given warm blankets, bed has been adjusted many times and he's been brought to restroom.

## 2017-06-18 NOTE — Tx Team (Signed)
Initial Treatment Plan 06/18/2017 2:01 AM Jose Hays [id number]    PATIENT STRESSORS: Educational concerns Financial difficulties Health problems   PATIENT STRENGTHS: Ability for insight Active sense of humor Average or above average intelligence   PATIENT IDENTIFIED PROBLEMS: "anxiety"  "depression"  "medication evaluation"                 DISCHARGE CRITERIA:  Ability to meet basic life and health needs Adequate post-discharge living arrangements Improved stabilization in mood, thinking, and/or behavior  PRELIMINARY DISCHARGE PLAN: Attend aftercare/continuing care group Attend PHP/IOP Attend 12-step recovery group  PATIENT/FAMILY INVOLVEMENT: This treatment plan has been presented to and reviewed with the patient, Jose Hays.  The patient and family have been given the opportunity to ask questions and make suggestions.  Jonetta Speak, RN 06/18/2017, 2:01 AM
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
## 2017-06-18 NOTE — ED Notes (Signed)
From [facility] with c/o LLQ abd pain x 2days with diarrhea x3days. Burping relieves pain. SI (tech in room). Denies N/V. Vitals stable en route.

## 2017-06-18 NOTE — ED Notes (Signed)
Bed: WA01 Expected date:  Expected time:  Means of arrival:  Comments: EMS-from [facility]-abdominal pain
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
## 2017-06-18 NOTE — Progress Notes (Signed)
Dar Note: Patient sent to [facility] for medical evaluation due to complain of severe abdominal discomfort, nausea without vomiting episode.  Complain of loose stool x three days.  Vital signs result of 99/85 71 18 97.5 100% RA.  Patient is alert and oriented to person, place and time.  Report given to charge ED charge nurse.

## 2017-06-18 NOTE — Progress Notes (Signed)
Dar Note: Patient admitted to medical floor rm 1343-01.  Personal belongings from bedside (2 blue jeans, red/white tee shirt, black sneakers without strings, and a gray change purse) and items from locker #38  transferred by security.

## 2017-06-19 ENCOUNTER — Observation Stay (HOSPITAL_COMMUNITY): Payer: Medicaid Other

## 2017-06-19 DIAGNOSIS — F17210 Nicotine dependence, cigarettes, uncomplicated: Secondary | ICD-10-CM

## 2017-06-19 DIAGNOSIS — X789XXA Intentional self-harm by unspecified sharp object, initial encounter: Secondary | ICD-10-CM

## 2017-06-19 DIAGNOSIS — T1491XA Suicide attempt, initial encounter: Secondary | ICD-10-CM

## 2017-06-19 DIAGNOSIS — S61512A Laceration without foreign body of left wrist, initial encounter: Secondary | ICD-10-CM

## 2017-06-19 DIAGNOSIS — S61511A Laceration without foreign body of right wrist, initial encounter: Secondary | ICD-10-CM

## 2017-06-19 DIAGNOSIS — Z62811 Personal history of psychological abuse in childhood: Secondary | ICD-10-CM

## 2017-06-19 DIAGNOSIS — Z560 Unemployment, unspecified: Secondary | ICD-10-CM

## 2017-06-19 DIAGNOSIS — F332 Major depressive disorder, recurrent severe without psychotic features: Secondary | ICD-10-CM

## 2017-06-19 DIAGNOSIS — Z811 Family history of alcohol abuse and dependence: Secondary | ICD-10-CM

## 2017-06-19 DIAGNOSIS — R45851 Suicidal ideations: Secondary | ICD-10-CM

## 2017-06-19 DIAGNOSIS — G4700 Insomnia, unspecified: Secondary | ICD-10-CM

## 2017-06-19 DIAGNOSIS — R109 Unspecified abdominal pain: Secondary | ICD-10-CM

## 2017-06-19 DIAGNOSIS — F1210 Cannabis abuse, uncomplicated: Secondary | ICD-10-CM

## 2017-06-19 DIAGNOSIS — K56609 Unspecified intestinal obstruction, unspecified as to partial versus complete obstruction: Secondary | ICD-10-CM

## 2017-06-19 DIAGNOSIS — Z62810 Personal history of physical and sexual abuse in childhood: Secondary | ICD-10-CM

## 2017-06-19 DIAGNOSIS — F1410 Cocaine abuse, uncomplicated: Secondary | ICD-10-CM

## 2017-06-19 LAB — BASIC METABOLIC PANEL
ANION GAP: 10 (ref 5–15)
BUN: 9 mg/dL (ref 6–20)
CHLORIDE: 104 mmol/L (ref 101–111)
CO2: 22 mmol/L (ref 22–32)
Calcium: 8.8 mg/dL — ABNORMAL LOW (ref 8.9–10.3)
Creatinine, Ser: 1.01 mg/dL (ref 0.61–1.24)
GFR calc Af Amer: >60 mL/min (ref >60)
GFR calc non Af Amer: >60 mL/min (ref >60)
Glucose, Bld: 106 mg/dL — ABNORMAL HIGH (ref 65–99)
POTASSIUM: 3.8 mmol/L (ref 3.5–5.1)
Sodium: 136 mmol/L (ref 135–145)

## 2017-06-19 LAB — CBC
HCT: 45.9 % (ref 39.0–52.0)
Hemoglobin: 14.9 g/dL (ref 13.0–17.0)
MCH: 26.6 pg (ref 26.0–34.0)
MCHC: 32.5 g/dL (ref 30.0–36.0)
MCV: 82 fL (ref 78.0–100.0)
PLATELETS: 174 10*3/uL (ref 150–400)
RBC: 5.6 MIL/uL (ref 4.22–5.81)
RDW: 15.1 % (ref 11.5–15.5)
WBC: 8.4 10*3/uL (ref 4.0–10.5)

## 2017-06-19 LAB — MAGNESIUM: Magnesium: 1.7 mg/dL (ref 1.7–2.4)

## 2017-06-19 LAB — HIV ANTIBODY (ROUTINE TESTING W REFLEX): HIV Screen 4th Generation wRfx: NONREACTIVE

## 2017-06-19 MED ORDER — TRAZODONE HCL 50 MG PO TABS: 50.0000 mg | ORAL_TABLET | Freq: Every evening | ORAL | Status: DC | PRN

## 2017-06-19 MED ORDER — BUPROPION HCL ER (XL) 150 MG PO TB24
150.0000 mg | ORAL_TABLET | Freq: Every day | ORAL | Status: DC
  Administered 2017-06-19 – 2017-06-20 (×2): 150 mg via ORAL
  Filled 2017-06-19 (×2): qty 1

## 2017-06-19 MED ORDER — NICOTINE 21 MG/24HR TD PT24
21.0000 mg | MEDICATED_PATCH | Freq: Every day | TRANSDERMAL | Status: DC
  Administered 2017-06-19 – 2017-06-20 (×2): 21 mg via TRANSDERMAL
  Filled 2017-06-19 (×2): qty 1

## 2017-06-19 MED ORDER — HYDROXYZINE HCL 25 MG PO TABS: 25.0000 mg | ORAL_TABLET | Freq: Three times a day (TID) | ORAL | Status: DC | PRN

## 2017-06-19 MED ORDER — MAGNESIUM SULFATE 4 GM/100ML IV SOLN
4.0000 g | Freq: Once | INTRAVENOUS | Status: AC
  Administered 2017-06-19: 4 g via INTRAVENOUS
  Filled 2017-06-19: qty 100

## 2017-06-19 MED ORDER — POTASSIUM CHLORIDE 10 MEQ/100ML IV SOLN
10.0000 meq | INTRAVENOUS | Status: AC
  Administered 2017-06-19 (×3): 10 meq via INTRAVENOUS
  Filled 2017-06-19 (×2): qty 100

## 2017-06-19 NOTE — Progress Notes (Signed)
PROGRESS NOTE    Jose Hays  ZOX:[id number] DOB: [birth year] DOA: 06/18/2017 PCP: Patient, No Pcp Per   Brief Narrative:  Jose Hays is a 42 y.o. male with medical history significant for morbid obesity, homelessness and cocaine abuse who was admitted at behavioral [facility] 1 day ago for suicidal ideation.  He was sent over to [facility] long today after continued complaints of lower quadrant abdominal pain.  Patient's lab work noteworthy for mild leukocytosis of 13 although no infection noted.  CT scan of his abdomen and pelvis noted for distal small bowel obstruction with transition zone noted in the right lower quadrant.  Interestingly, patient has never had prior abdominal surgery.  With these findings, it was felt best that he come in for further evaluation.  General surgery was consulted at this time, no surgical intervention needed.     Assessment & Plan:   Principal Problem:   SBO (small bowel obstruction) (HCC) Active Problems:   Cocaine abuse with cocaine-induced mood disorder (HCC)   MDD (major depressive disorder), recurrent episode, severe (HCC)   Morbid obesity (HCC)   Suicidal ideation   Homeless  1 small bowel obstruction Questionable etiology.  Per CT abdomen and pelvis.  Patient with no prior history of surgeries or history of cancers.  Patient tolerated clear liquids.  Patient states had a bowel movement earlier this morning however not recorded.  Repeat abdominal films this morning with a degree of small bowel obstruction however air noted in the colon suggesting incomplete obstruction.  No free air.  Consulted general surgery this morning who reviewed films.  It was felt patient improved clinically and felt may have had a component of gastroenteritis.  General surgery recommending to advance diet as tolerated.  We will advance patient to a full liquid diet today.  If continued improvement could advance to a soft diet tomorrow.  Appreciate general surgical input and  recommendations.  2.  Major depressive disorder/suicidal ideation Patient admitted to [facility] 1 day prior to admission on 06/17/2017 with depression and suicidal ideation with a plan to shoot himself with a friend's gun.  Patient with prior history of suicide attempts including cutting his wrists.  Psychiatry was reconsulted to reassess patient and is recommended that patient warrants inpatient psychiatric hospitalization due to high risk of harm to self.  Will continue sitter.  Psychiatry recommended resuming patient's Wellbutrin XL 150 mg daily for depression, Atarax 25 mg 3 times daily as needed for anxiety and trazodone 50 mg nightly as needed for insomnia.  It is noted per psychiatry that if patient refuses voluntary psychiatric hospitalization will need involuntary commitment.  Once patient is medically stable and small bowel obstruction has resolved will need to be transferred back to [facility] for further management of his depression and suicidal ideation.  3.  Leukocytosis Likely reactive.  Resolved.   4.  Morbid obesity   DVT prophylaxis: Lovenox Code Status: Full Family Communication: Updated patient.  No family at bedside. Disposition Plan: Back to behavioral Health Center when medically stable, tolerating oral intake with improvement with abdominal pain hopefully in the next 24 to 48 hours.   Consultants:   Psychiatry: Dr. Sharma Covert 06/19/2017  General surgery: Dr. Cliffton Asters 06/19/2017  Procedures:   CT abdomen and pelvis 06/18/2017  Acute abdominal series 06/19/2017  Antimicrobials:   None   Subjective: Patient states had a bowel movement earlier this morning however not noted to be recorded.  Patient denies any nausea or emesis.  No shortness of breath.  No chest pain.  Still with some left lower quadrant abdominal pain which she states with some improvement since admission however not at baseline.  Asking for diet to be advanced to a solid diet.  Objective: Vitals:   06/18/17 2128  06/19/17 0553  BP: (!) 133/99 127/83  Pulse: 95 85  Resp: 16 16  Temp: 99.3 F (37.4 C) 98.1 F (36.7 C)  TempSrc: Oral Oral  SpO2:  90%    Intake/Output Summary (Last 24 hours) at 06/19/2017 1145 Last data filed at 06/19/2017 0720 Gross per 24 hour  Intake 1223.75 ml  Output -  Net 1223.75 ml   There were no vitals filed for this visit.  Examination:  General exam: Appears calm and comfortable  Respiratory system: Clear to auscultation. Respiratory effort normal. Cardiovascular system: S1 & S2 heard, RRR. No JVD, murmurs, rubs, gallops or clicks. No pedal edema. Gastrointestinal system: Abdomen is nondistended, soft and tender to palpation left lower quadrant and lower mid abdomen. No organomegaly or masses felt. Normal bowel sounds heard. Central nervous system: Alert and oriented. No focal neurological deficits. Extremities: Symmetric 5 x 5 power. Skin: No rashes, lesions or ulcers Psychiatry: Judgement and insight appear normal. Mood & affect appropriate.     Data Reviewed: I have personally reviewed following labs and imaging studies  CBC: Recent Labs  Lab 06/17/17 2227 06/18/17 1251 06/19/17 0354  WBC 10.4 13.1* 8.4  NEUTROABS  --  11.1*  --   HGB 16.1 16.6 14.9  HCT 48.4 50.7 45.9  MCV 81.5 81.5 82.0  PLT 211 214 174   Basic Metabolic Panel: Recent Labs  Lab 06/17/17 2227 06/18/17 1251 06/19/17 0354  NA 138 137 136  K 4.1 3.8 3.8  CL 107 101 104  CO2 GLUCOSE 96 102* 106*  BUN CREATININE 0.92 1.01 1.01  CALCIUM 9.4 9.3 8.8*  MG  --   --  1.7   GFR: Estimated Creatinine Clearance: 156.3 mL/min (by C-G formula based on SCr of 1.01 mg/dL). Liver Function Tests: Recent Labs  Lab 06/17/17 2227 06/18/17 1251  AST 14* 16  ALT 17 17  ALKPHOS 64 67  BILITOT 1.3* 1.3*  PROT 8.3* 8.3*  ALBUMIN 4.1 4.1   Recent Labs  Lab 06/18/17 1251  LIPASE 25   No results for input(s): AMMONIA in the last 168 hours. Coagulation  Profile: No results for input(s): INR, PROTIME in the last 168 hours. Cardiac Enzymes: No results for input(s): CKTOTAL, CKMB, CKMBINDEX, TROPONINI in the last 168 hours. BNP (last 3 results) No results for input(s): PROBNP in the last 8760 hours. HbA1C: No results for input(s): HGBA1C in the last 72 hours. CBG: No results for input(s): GLUCAP in the last 168 hours. Lipid Profile: No results for input(s): CHOL, HDL, LDLCALC, TRIG, CHOLHDL, LDLDIRECT in the last 72 hours. Thyroid Function Tests: No results for input(s): TSH, T4TOTAL, FREET4, T3FREE, THYROIDAB in the last 72 hours. Anemia Panel: No results for input(s): VITAMINB12, FOLATE, FERRITIN, TIBC, IRON, RETICCTPCT in the last 72 hours. Sepsis Labs: No results for input(s): PROCALCITON, LATICACIDVEN in the last 168 hours.  No results found for this or any previous visit (from the past 240 hour(s)).       Radiology Studies: Ct Abdomen Pelvis W Contrast  Result Date: 06/18/2017 CLINICAL DATA:  Acute left lower quadrant abdominal pain. EXAM: CT ABDOMEN AND PELVIS WITH CONTRAST TECHNIQUE: Multidetector CT imaging of the abdomen and pelvis was performed using  the standard protocol following bolus administration of intravenous contrast. CONTRAST:  ISOVUE-300 IOPAMIDOL (ISOVUE-300) INJECTION 61% COMPARISON:  None. FINDINGS: Lower chest: No acute abnormality. Hepatobiliary: No focal liver abnormality is seen. No gallstones, gallbladder wall thickening, or biliary dilatation. Pancreas: Unremarkable. No pancreatic ductal dilatation or surrounding inflammatory changes. Spleen: Normal in size without focal abnormality. Adrenals/Urinary Tract: Adrenal glands are unremarkable. Kidneys are normal, without renal calculi, focal lesion, or hydronephrosis. Bladder is unremarkable. Stomach/Bowel: The appendix appears normal. Moderate gastric distention is noted. Diffuse small bowel dilatation is noted with transition zone seen in the right lower  quadrant best seen on image number 69 of series 2. Vascular/Lymphatic: No significant vascular findings are present. No enlarged abdominal or pelvic lymph nodes. Reproductive: Prostate is unremarkable. Other: No abdominal wall hernia or abnormality. No abdominopelvic ascites. Musculoskeletal: No acute or significant osseous findings. IMPRESSION: Small bowel dilatation is noted concerning for distal small bowel obstruction. Transition zone is noted in the right lower quadrant of uncertain etiology. Electronically Signed   By: Lupita Raider, M.D.   On: 06/18/2017 14:29   Acute Abdominal Series  Result Date: 06/19/2017 CLINICAL DATA:  Small bowel obstruction EXAM: DG ABDOMEN ACUTE W/ 1V CHEST COMPARISON:  CT abdomen and pelvis Jun 18, 2017; chest radiograph and chest CT December 25, 2016 FINDINGS: PA chest: There is slight bibasilar atelectasis. There is no edema or consolidation. Heart is upper normal in size with pulmonary vascularity normal. No abscess. Supine and left lateral decubitus abdomen: There are multiple loops of dilated small bowel with air-fluid levels, consistent with persistent bowel obstruction. There is moderate air in the colon. No free air. There are small phleboliths in the pelvis. IMPRESSION: Evidence a degree of small bowel obstruction. Note that there is air in colon suggesting incomplete obstruction. No free air. No edema or consolidation. Slight bibasilar atelectasis. Electronically Signed   By: Bretta Bang III M.D.   On: 06/19/2017 09:48        Scheduled Meds: . enoxaparin (LOVENOX) injection  80 mg Subcutaneous Q24H  . nicotine  21 mg Transdermal Daily   Continuous Infusions: . sodium chloride 75 mL/hr at 06/19/17 0742  . potassium chloride 10 mEq (06/19/17 1136)     LOS: 0 days    Time spent: 35 minutes    Ramiro Harvest, MD [facility] Pager [phone]  If 7PM-7AM, please contact night-coverage [email or web address] Password [email or web address] 06/19/2017, 11:45 AM
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
## 2017-06-19 NOTE — H&P (Signed)
CC: Consult by Dr. Grandville Silos for possible SBO  HPI: Jose Hays is an 42 y.o. male [facility] for abdominal discomfort of 3d duration. Never had this before. Pain was left sided. Pain did not radiate and nothing made it better or worse. He was having diarrhea throughout all of this - multiple loose water BMs per day. Denies f/c. Denies sick contacts. Some nausea but denies any emesis. He was admitted here from [place] where he was being treated for suicidal ideations.  PMH: HTN  PSH: Denies any prior abdominal operations; right arm surgery following fall through glass with what describes as transection of his radial and ulnar arteries and "many nerves"  FHx: Denies known family hx of malignancy  Social: Smokes occasionally, unable to quantify; denies EtOH; reports past use of cocaine and marijuana  Past Medical History:  Diagnosis Date  . Coronary artery disease   . Hypertension      Family History  Problem Relation Age of Onset  . Alcohol abuse Father     Social:  reports that he has been smoking.  He has been smoking about 0.25 packs per day. He has never used smokeless tobacco. He reports that he has current or past drug history. Drugs: Cocaine and Marijuana. He reports that he does not drink alcohol.  Allergies: No Known Allergies  Medications: I have reviewed the patient's current medications.  Results for orders placed or performed during the hospital encounter of 06/18/17 (from the past 48 hour(s))  Basic metabolic panel     Status: Abnormal   Collection Time: 06/19/17  3:54 AM  Result Value Ref Range   Sodium 136 135 - 145 mmol/L   Potassium 3.8 3.5 - 5.1 mmol/L   Chloride 104 101 - 111 mmol/L   CO2 22 22 - 32 mmol/L   Glucose, Bld 106 (H) 65 - 99 mg/dL   BUN 9 6 - 20 mg/dL   Creatinine, Ser 1.01 0.61 - 1.24 mg/dL   Calcium 8.8 (L) 8.9 - 10.3 mg/dL   GFR calc non Af Amer >60 >60 mL/min   GFR calc Af Amer >60 >60 mL/min    Comment:  (NOTE) The eGFR has been calculated using the CKD EPI equation. This calculation has not been validated in all clinical situations. eGFR's persistently <60 mL/min signify possible Chronic Kidney Disease.    Anion gap 10 5 - 15    Comment: Performed at [facility], [place] [address], [place], [place] [address]  CBC     Status: None   Collection Time: 06/19/17  3:54 AM  Result Value Ref Range   WBC 8.4 4.0 - 10.5 K/uL   RBC 5.60 4.22 - 5.81 MIL/uL   Hemoglobin 14.9 13.0 - 17.0 g/dL   HCT 45.9 39.0 - 52.0 %   MCV 82.0 78.0 - 100.0 fL   MCH 26.6 26.0 - 34.0 pg   MCHC 32.5 30.0 - 36.0 g/dL   RDW 15.1 11.5 - 15.5 %   Platelets 174 150 - 400 K/uL    Comment: Performed at [facility], [place] [address], [place], [place] [address]  Magnesium     Status: None   Collection Time: 06/19/17  3:54 AM  Result Value Ref Range   Magnesium 1.7 1.7 - 2.4 mg/dL    Comment: Performed at [facility], [place] [address] [place], [place] [address]    Ct Abdomen Pelvis W Contrast  Result Date: 06/18/2017 CLINICAL DATA:  Acute left lower quadrant abdominal pain. EXAM: CT ABDOMEN AND PELVIS WITH CONTRAST TECHNIQUE: Multidetector CT imaging of the abdomen and pelvis was performed using the standard protocol following bolus administration of intravenous contrast. CONTRAST:  129m ISOVUE-300 IOPAMIDOL (ISOVUE-300) INJECTION 61% COMPARISON:  None. FINDINGS: Lower chest: No acute abnormality. Hepatobiliary: No focal liver abnormality is seen. No gallstones, gallbladder wall thickening, or biliary dilatation. Pancreas: Unremarkable. No pancreatic ductal dilatation or surrounding inflammatory changes. Spleen: Normal in size without focal abnormality. Adrenals/Urinary Tract: Adrenal glands are unremarkable. Kidneys are normal, without renal calculi, focal lesion, or hydronephrosis. Bladder is unremarkable. Stomach/Bowel: The appendix appears normal. Moderate gastric  distention is noted. Diffuse small bowel dilatation is noted with transition zone seen in the right lower quadrant best seen on image number 69 of series 2. Vascular/Lymphatic: No significant vascular findings are present. No enlarged abdominal or pelvic lymph nodes. Reproductive: Prostate is unremarkable. Other: No abdominal wall hernia or abnormality. No abdominopelvic ascites. Musculoskeletal: No acute or significant osseous findings. IMPRESSION: Small bowel dilatation is noted concerning for distal small bowel obstruction. Transition zone is noted in the right lower quadrant of uncertain etiology. Electronically Signed   By: JMarijo Conception M.D.   On: 06/18/2017 14:29   Acute Abdominal Series  Result Date: 06/19/2017 CLINICAL DATA:  Small bowel obstruction EXAM: DG ABDOMEN ACUTE W/ 1V CHEST COMPARISON:  CT abdomen and pelvis Jun 18, 2017; chest radiograph and chest CT December 25, 2016 FINDINGS: PA chest: There is slight bibasilar atelectasis. There is no edema or consolidation. Heart is upper normal in size with pulmonary vascularity normal. No abscess. Supine and left lateral decubitus abdomen: There are multiple loops of dilated small bowel with air-fluid levels, consistent with persistent bowel obstruction. There is moderate air in the colon. No free air. There are small phleboliths in the pelvis. IMPRESSION: Evidence a degree of small bowel obstruction. Note that there is air in colon suggesting incomplete obstruction. No free air. No edema or consolidation. Slight bibasilar atelectasis. Electronically Signed   By: WLowella GripIII M.D.   On: 06/19/2017 09:48    ROS - all of the below systems have been reviewed with the patient and positives are indicated with bold text General: chills, fever or night sweats Eyes: blurry vision or double vision ENT: epistaxis or sore throat Allergy/Immunology: itchy/watery eyes or nasal congestion Hematologic/Lymphatic: bleeding problems, blood clots or  swollen lymph nodes Endocrine: temperature intolerance or unexpected weight changes Breast: new or changing breast lumps or nipple discharge Resp: cough, shortness of breath, or wheezing CV: chest pain or dyspnea on exertion GI: as per HPI GU: dysuria, trouble voiding, or hematuria MSK: joint pain or joint stiffness Neuro: TIA or stroke symptoms Derm: pruritus and skin lesion changes Psych: anxiety and depression  PE Blood pressure 127/83, pulse 85, temperature 98.1 F (36.7 C), temperature source Oral, resp. rate 16, SpO2 90 %. Constitutional: NAD; conversant; no deformities Eyes: Moist conjunctiva; no lid lag; anicteric; PERRL Neck: Trachea midline; no thyromegaly Lungs: Normal respiratory effort; no tactile fremitus CV: RRR; no palpable thrills; no pitting edema GI: Abd obese, soft, NT, difficult to assess distention 2/2 habitus; no palpable hepatosplenomegaly  MSK: Normal gait; no clubbing/cyanosis; RUE with scarring consistent with surgical hx Psychiatric: Appropriate affect; alert and oriented x3 Lymphatic: No palpable cervical or axillary lymphadenopathy  Results for orders placed or performed during the hospital encounter of 06/18/17 (from the past 48 hour(s))  Basic metabolic panel     Status: Abnormal  Collection Time: 06/19/17  3:54 AM  Result Value Ref Range   Sodium 136 135 - 145 mmol/L   Potassium 3.8 3.5 - 5.1 mmol/L   Chloride 104 101 - 111 mmol/L   CO2 22 22 - 32 mmol/L   Glucose, Bld 106 (H) 65 - 99 mg/dL   BUN 9 6 - 20 mg/dL   Creatinine, Ser 1.01 0.61 - 1.24 mg/dL   Calcium 8.8 (L) 8.9 - 10.3 mg/dL   GFR calc non Af Amer >60 >60 mL/min   GFR calc Af Amer >60 >60 mL/min    Comment: (NOTE) The eGFR has been calculated using the CKD EPI equation. This calculation has not been validated in all clinical situations. eGFR's persistently <60 mL/min signify possible Chronic Kidney Disease.    Anion gap 10 5 - 15    Comment: Performed at [facility], [place] [address], [place], [place] [address]  CBC     Status: None   Collection Time: 06/19/17  3:54 AM  Result Value Ref Range   WBC 8.4 4.0 - 10.5 K/uL   RBC 5.60 4.22 - 5.81 MIL/uL   Hemoglobin 14.9 13.0 - 17.0 g/dL   HCT 45.9 39.0 - 52.0 %   MCV 82.0 78.0 - 100.0 fL   MCH 26.6 26.0 - 34.0 pg   MCHC 32.5 30.0 - 36.0 g/dL   RDW 15.1 11.5 - 15.5 %   Platelets 174 150 - 400 K/uL    Comment: Performed at [facility], [place] [address] [place], [place] [address]  Magnesium     Status: None   Collection Time: 06/19/17  3:54 AM  Result Value Ref Range   Magnesium 1.7 1.7 - 2.4 mg/dL    Comment: Performed at [facility], [place] [address] [place], [place] [address]    Ct Abdomen Pelvis W Contrast  Result Date: 06/18/2017 CLINICAL DATA:  Acute left lower quadrant abdominal pain. EXAM: CT ABDOMEN AND PELVIS WITH CONTRAST TECHNIQUE: Multidetector CT imaging of the abdomen and pelvis was performed using the standard protocol following bolus administration of intravenous contrast. CONTRAST:  170m ISOVUE-300 IOPAMIDOL (ISOVUE-300) INJECTION 61% COMPARISON:  None. FINDINGS: Lower chest: No acute abnormality. Hepatobiliary: No focal liver abnormality is seen. No gallstones, gallbladder wall thickening, or biliary dilatation. Pancreas: Unremarkable. No pancreatic ductal dilatation or surrounding inflammatory changes. Spleen: Normal in size without focal abnormality. Adrenals/Urinary Tract: Adrenal glands are unremarkable. Kidneys are normal, without renal calculi, focal lesion, or hydronephrosis. Bladder is unremarkable. Stomach/Bowel: The appendix appears normal. Moderate gastric distention is noted. Diffuse small bowel dilatation is noted with transition zone seen in the right lower quadrant best seen on image number 69 of series 2. Vascular/Lymphatic: No significant vascular findings are present. No enlarged abdominal or pelvic lymph nodes. Reproductive:  Prostate is unremarkable. Other: No abdominal wall hernia or abnormality. No abdominopelvic ascites. Musculoskeletal: No acute or significant osseous findings. IMPRESSION: Small bowel dilatation is noted concerning for distal small bowel obstruction. Transition zone is noted in the right lower quadrant of uncertain etiology. Electronically Signed   By: JMarijo Conception M.D.   On: 06/18/2017 14:29   Acute Abdominal Series  Result Date: 06/19/2017 CLINICAL DATA:  Small bowel obstruction EXAM: DG ABDOMEN ACUTE W/ 1V CHEST COMPARISON:  CT abdomen and pelvis Jun 18, 2017; chest radiograph and chest CT December 25, 2016 FINDINGS: PA chest: There is slight bibasilar atelectasis. There is no edema or consolidation. Heart is upper normal in size  with pulmonary vascularity normal. No abscess. Supine and left lateral decubitus abdomen: There are multiple loops of dilated small bowel with air-fluid levels, consistent with persistent bowel obstruction. There is moderate air in the colon. No free air. There are small phleboliths in the pelvis. IMPRESSION: Evidence a degree of small bowel obstruction. Note that there is air in colon suggesting incomplete obstruction. No free air. No edema or consolidation. Slight bibasilar atelectasis. Electronically Signed   By: Lowella Grip III M.D.   On: 06/19/2017 09:48    A/P: Jose Hays is an 42 y.o. male with no prior abdominal surgical history admitted with ?pSBO vs gastroenteritis  -Small fat containing umbilical hernia on CT; no other hernias noted -He had a BM overnight and states his abodminal discomfort has ~resolved. Had XR this AM which demonstrated a fair amount of air in his colon. It's likely that this was a gastroenteritis -Already clearing trays of clear liquids without issue - no n/v. Ok to advance diet as tolerated -We will follow with you  Sharon Mt. Dema Severin, M.D. [facility]

## 2017-06-19 NOTE — Progress Notes (Signed)
LCSW consulted for facility placement.   Patient from [facility]. Patient will resume services at [facility] when medically clear.   LCSW will continue to follow for dc needs.   [name] [place] [facility] CSW [phone]

## 2017-06-20 ENCOUNTER — Encounter (HOSPITAL_COMMUNITY): Payer: Self-pay | Admitting: *Deleted

## 2017-06-20 ENCOUNTER — Other Ambulatory Visit: Payer: Self-pay

## 2017-06-20 ENCOUNTER — Inpatient Hospital Stay (HOSPITAL_COMMUNITY)
Admission: AD | Admit: 2017-06-20 | Discharge: 2017-06-29 | DRG: 885 | Disposition: A | Discharge status: Home / Self Care | Payer: No Typology Code available for payment source | Source: Intra-hospital | Attending: Psychiatry | Admitting: Psychiatry

## 2017-06-20 DIAGNOSIS — Z79899 Other long term (current) drug therapy: Secondary | ICD-10-CM

## 2017-06-20 DIAGNOSIS — I10 Essential (primary) hypertension: Secondary | ICD-10-CM | POA: Diagnosis present

## 2017-06-20 DIAGNOSIS — Z9114 Patient's other noncompliance with medication regimen: Secondary | ICD-10-CM | POA: Diagnosis not present

## 2017-06-20 DIAGNOSIS — Z634 Disappearance and death of family member: Secondary | ICD-10-CM | POA: Diagnosis not present

## 2017-06-20 DIAGNOSIS — F429 Obsessive-compulsive disorder, unspecified: Secondary | ICD-10-CM | POA: Diagnosis present

## 2017-06-20 DIAGNOSIS — F329 Major depressive disorder, single episode, unspecified: Secondary | ICD-10-CM | POA: Diagnosis not present

## 2017-06-20 DIAGNOSIS — F1290 Cannabis use, unspecified, uncomplicated: Secondary | ICD-10-CM | POA: Diagnosis not present

## 2017-06-20 DIAGNOSIS — F1410 Cocaine abuse, uncomplicated: Secondary | ICD-10-CM | POA: Diagnosis not present

## 2017-06-20 DIAGNOSIS — F1210 Cannabis abuse, uncomplicated: Secondary | ICD-10-CM | POA: Diagnosis not present

## 2017-06-20 DIAGNOSIS — Z813 Family history of other psychoactive substance abuse and dependence: Secondary | ICD-10-CM | POA: Diagnosis not present

## 2017-06-20 DIAGNOSIS — M79603 Pain in arm, unspecified: Secondary | ICD-10-CM | POA: Diagnosis not present

## 2017-06-20 DIAGNOSIS — I2510 Atherosclerotic heart disease of native coronary artery without angina pectoris: Secondary | ICD-10-CM | POA: Diagnosis present

## 2017-06-20 DIAGNOSIS — Z590 Homelessness: Secondary | ICD-10-CM | POA: Diagnosis not present

## 2017-06-20 DIAGNOSIS — F17210 Nicotine dependence, cigarettes, uncomplicated: Secondary | ICD-10-CM | POA: Diagnosis present

## 2017-06-20 DIAGNOSIS — F4310 Post-traumatic stress disorder, unspecified: Secondary | ICD-10-CM | POA: Diagnosis not present

## 2017-06-20 DIAGNOSIS — R079 Chest pain, unspecified: Secondary | ICD-10-CM

## 2017-06-20 DIAGNOSIS — Z811 Family history of alcohol abuse and dependence: Secondary | ICD-10-CM | POA: Diagnosis not present

## 2017-06-20 DIAGNOSIS — R450 Nervousness: Secondary | ICD-10-CM | POA: Diagnosis not present

## 2017-06-20 DIAGNOSIS — Z6839 Body mass index (BMI) 39.0-39.9, adult: Secondary | ICD-10-CM | POA: Diagnosis not present

## 2017-06-20 DIAGNOSIS — Z915 Personal history of self-harm: Secondary | ICD-10-CM

## 2017-06-20 DIAGNOSIS — F332 Major depressive disorder, recurrent severe without psychotic features: Principal | ICD-10-CM | POA: Diagnosis present

## 2017-06-20 DIAGNOSIS — R45851 Suicidal ideations: Secondary | ICD-10-CM | POA: Diagnosis not present

## 2017-06-20 DIAGNOSIS — F1490 Cocaine use, unspecified, uncomplicated: Secondary | ICD-10-CM | POA: Diagnosis not present

## 2017-06-20 DIAGNOSIS — G4700 Insomnia, unspecified: Secondary | ICD-10-CM | POA: Diagnosis not present

## 2017-06-20 DIAGNOSIS — F419 Anxiety disorder, unspecified: Secondary | ICD-10-CM | POA: Diagnosis not present

## 2017-06-20 DIAGNOSIS — Z818 Family history of other mental and behavioral disorders: Secondary | ICD-10-CM | POA: Diagnosis not present

## 2017-06-20 DIAGNOSIS — Z62811 Personal history of psychological abuse in childhood: Secondary | ICD-10-CM | POA: Diagnosis not present

## 2017-06-20 DIAGNOSIS — Z560 Unemployment, unspecified: Secondary | ICD-10-CM | POA: Diagnosis not present

## 2017-06-20 DIAGNOSIS — Z62810 Personal history of physical and sexual abuse in childhood: Secondary | ICD-10-CM | POA: Diagnosis not present

## 2017-06-20 LAB — BASIC METABOLIC PANEL
ANION GAP: 10 (ref 5–15)
BUN: <5 mg/dL — ABNORMAL LOW (ref 6–20)
CO2: 21 mmol/L — ABNORMAL LOW (ref 22–32)
Calcium: 8.7 mg/dL — ABNORMAL LOW (ref 8.9–10.3)
Chloride: 104 mmol/L (ref 101–111)
Creatinine, Ser: 0.97 mg/dL (ref 0.61–1.24)
GFR calc Af Amer: >60 mL/min (ref >60)
GLUCOSE: 97 mg/dL (ref 65–99)
POTASSIUM: 4.2 mmol/L (ref 3.5–5.1)
Sodium: 135 mmol/L (ref 135–145)

## 2017-06-20 LAB — MAGNESIUM: Magnesium: 2.1 mg/dL (ref 1.7–2.4)

## 2017-06-20 MED ORDER — HYDROXYZINE HCL 25 MG PO TABS
25.0000 mg | ORAL_TABLET | Freq: Three times a day (TID) | ORAL | Status: DC | PRN
  Administered 2017-06-20 – 2017-06-27 (×7): 25 mg via ORAL
  Filled 2017-06-20 (×4): qty 1
  Filled 2017-06-20: qty 10
  Filled 2017-06-20 (×3): qty 1

## 2017-06-20 MED ORDER — TRAZODONE HCL 50 MG PO TABS
ORAL_TABLET | ORAL | Status: AC
  Filled 2017-06-20: qty 1

## 2017-06-20 MED ORDER — ALUM & MAG HYDROXIDE-SIMETH 200-200-20 MG/5ML PO SUSP: 30.0000 mL | ORAL | Status: DC | PRN

## 2017-06-20 MED ORDER — TRAZODONE HCL 50 MG PO TABS: 50.0000 mg | ORAL_TABLET | Freq: Every evening | ORAL | 0 refills | Status: DC | PRN

## 2017-06-20 MED ORDER — TRAZODONE HCL 50 MG PO TABS
50.0000 mg | ORAL_TABLET | Freq: Every evening | ORAL | Status: DC | PRN
  Administered 2017-06-20: 50 mg via ORAL

## 2017-06-20 MED ORDER — ONDANSETRON HCL 4 MG PO TABS: 4.0000 mg | ORAL_TABLET | Freq: Three times a day (TID) | ORAL | Status: DC | PRN

## 2017-06-20 MED ORDER — NICOTINE 21 MG/24HR TD PT24
21.0000 mg | MEDICATED_PATCH | Freq: Every day | TRANSDERMAL | Status: DC
  Administered 2017-06-23: 21 mg via TRANSDERMAL
  Filled 2017-06-20 (×12): qty 1

## 2017-06-20 MED ORDER — POLYETHYLENE GLYCOL 3350 17 G PO PACK: 17.0000 g | PACK | Freq: Every day | ORAL | 0 refills | Status: DC

## 2017-06-20 MED ORDER — BUPROPION HCL ER (XL) 300 MG PO TB24
300.0000 mg | ORAL_TABLET | Freq: Every day | ORAL | Status: DC
  Administered 2017-06-21 – 2017-06-29 (×9): 300 mg via ORAL
  Filled 2017-06-20 (×2): qty 1
  Filled 2017-06-20: qty 7
  Filled 2017-06-20 (×9): qty 1

## 2017-06-20 MED ORDER — HYDROXYZINE HCL 25 MG PO TABS: 25.0000 mg | ORAL_TABLET | Freq: Three times a day (TID) | ORAL | 0 refills | Status: AC | PRN

## 2017-06-20 MED ORDER — NICOTINE 21 MG/24HR TD PT24: 21.0000 mg | MEDICATED_PATCH | Freq: Every day | TRANSDERMAL | 0 refills | Status: AC

## 2017-06-20 MED ORDER — BUPROPION HCL ER (XL) 150 MG PO TB24: 150.0000 mg | ORAL_TABLET | Freq: Every day | ORAL | 0 refills | Status: DC

## 2017-06-20 MED ORDER — ACETAMINOPHEN 325 MG PO TABS: 650.0000 mg | ORAL_TABLET | Freq: Four times a day (QID) | ORAL | Status: DC | PRN

## 2017-06-20 MED ORDER — POLYETHYLENE GLYCOL 3350 17 G PO PACK
17.0000 g | PACK | Freq: Every day | ORAL | Status: DC
  Administered 2017-06-20: 17 g via ORAL
  Filled 2017-06-20: qty 1

## 2017-06-20 MED ORDER — MAGNESIUM HYDROXIDE 400 MG/5ML PO SUSP: 30.0000 mL | Freq: Every day | ORAL | Status: DC | PRN

## 2017-06-20 NOTE — Progress Notes (Signed)
Jose Hays is a 43 year old male pt admitted on voluntary basis after transfer from medical unit. He spoke about how he has been feeling depressed and suicidal recently. He denies any SI on admission and is able to contract for safety while in the hospital. He denies any current abdominal pain and reports feeling much improved in regards to his bowel obstruction. Jose Hays was cooperative and appropriate on admission, he was oriented to the unit and safety maintained.

## 2017-06-20 NOTE — Progress Notes (Signed)
Patient has been accepted to [facility] per [facility]. Patient can arrive at 6:30pm   Room: 400 Bed 2  Nurse please call report to [phone] before the patient leaves the floor.   Call for transport: [phone] (attempted to arrange in advance, [facility] would not allow it)   Vivi Barrack, Theresia Majors, MSW Clinical Social Worker  [phone] 06/20/2017  4:08 PM

## 2017-06-20 NOTE — Tx Team (Signed)
Initial Treatment Plan 06/20/2017 7:48 PM [name] WUJ:[id number]    PATIENT STRESSORS: Marital or family conflict Medication change or noncompliance   PATIENT STRENGTHS: Ability for insight Average or above average intelligence Capable of independent living General fund of knowledge   PATIENT IDENTIFIED PROBLEMS: Depression Suicidal thoughts "Help with my depression"                     DISCHARGE CRITERIA:  Ability to meet basic life and health needs Improved stabilization in mood, thinking, and/or behavior Reduction of life-threatening or endangering symptoms to within safe limits Verbal commitment to aftercare and medication compliance  PRELIMINARY DISCHARGE PLAN: Attend aftercare/continuing care group  PATIENT/FAMILY INVOLVEMENT: This treatment plan has been presented to and reviewed with the patient, Jose Hays, and/or family member, .  The patient and family have been given the opportunity to ask questions and make suggestions.  Jose Hays, [place], [place] 06/20/2017, 7:48 PM

## 2017-06-20 NOTE — Discharge Summary (Signed)
Physician Discharge Summary  Jose Hays WUJ:[id number] DOB: [birth year] DOA: 06/18/2017  PCP: Patient, No Pcp Per  Admit date: 06/18/2017 Discharge date: 06/20/2017  Time spent: 60 minutes  Recommendations for Outpatient Follow-up:  1. Follow-up with MD and inpatient psychiatry.   Discharge Diagnoses:  Principal Problem:   Major depressive disorder, recurrent severe without psychotic features (HCC) Active Problems:   Cocaine abuse with cocaine-induced mood disorder (HCC)   MDD (major depressive disorder), recurrent episode, severe (HCC)   Morbid obesity (HCC)   SBO (small bowel obstruction) (HCC)   Suicidal ideation   Homeless   Small bowel obstruction (HCC)   Discharge Condition: Stable and improved  Diet recommendation: Regular  There were no vitals filed for this visit.  History of present illness:  Per Dr Consuella Lose is a 42 y.o. male with medical history significant for morbid obesity, homelessness and cocaine abuse who was [facility] 1 day prior to admission for suicidal ideation.  He was sent over to [facility] today after continued complaints of lower quadrant abdominal pain.  Patient's lab work noteworthy for mild leukocytosis of 13 although no infection noted.  CT scan of his abdomen and pelvis noted for distal small bowel obstruction with transition zone noted in the right lower quadrant.  Interestingly, patient has never had prior abdominal surgery.  With these findings, it was felt best that he come in for further evaluation.  General surgery was consulted at this time, no surgical intervention needed.    Hospital Course:  1 small bowel obstruction Questionable etiology.  Per CT abdomen and pelvis.  Patient with no prior history of surgeries or history of cancers.  Patient was admitted and placed on clears which he tolerated.  Patient noted to have a bowel movement during the hospitalization.  Repeat abdominal films done the  morning on 06/19/2017, with a degree of small bowel obstruction however air noted in the colon suggesting incomplete obstruction.  No free air.  Consulted general surgery who reviewed films.  It was felt patient improved clinically and felt may have had a component of gastroenteritis.  General surgery recommending to advance diet as tolerated.  Patient's diet was advanced to full liquid diet which he tolerated.  Patient was subsequently advanced to a soft diet which he tolerated.  Patient did not have any further abdominal pain.  Patient was passing flatus.  Patient was ambulating the hallways.  Patient had no nausea or vomiting.  Patient improved clinically and was cleared by general surgery to be stable for discharge.  Patient will be discharged on MiraLAX daily.  Patient will be discharged in stable and improved condition to inpatient psychiatry.   2.  Major depressive disorder/suicidal ideation Patient admitted to [facility] 1 day prior to admission on 06/17/2017 with depression and suicidal ideation with a plan to shoot himself with a friend's gun.  Patient with prior history of suicide attempts including cutting his wrists.  Psychiatry was reconsulted to reassess patient and recommended that patient warrants inpatient psychiatric hospitalization due to high risk of harm to self. Safety sitter was placed throughout the hospitalization.  Psychiatry recommended resuming patient's Wellbutrin XL 150 mg daily for depression, Atarax 25 mg 3 times daily as needed for anxiety and trazodone 50 mg nightly as needed for insomnia.  It is noted per psychiatry that if patient refuses voluntary psychiatric hospitalization will need involuntary commitment. Patient improved clinically and was medically stable by day of discharge for transfer to inpatient psychiatry.  3.  Leukocytosis Likely reactive.  Resolved.   4.  Morbid obesity    Procedures:  CT abdomen and pelvis 06/18/2017  Acute abdominal series  06/19/2017      Consultations:  Psychiatry: Dr. Sharma Covert 06/19/2017  General surgery: Dr. Cliffton Asters 06/19/2017      Discharge Exam: Vitals:   06/20/17 0621 06/20/17 1357  BP: (!) 140/94 (!) 143/80  Pulse: 72 79  Resp: 20 16  Temp: 97.9 F (36.6 C) 97.9 F (36.6 C)  SpO2: 99% 100%    General: NAD Cardiovascular: RRR Respiratory: CTAB Gastroenterology: Abdomen soft, nontender, nondistended, positive bowel sounds.  No rebound.  No guarding.  Discharge Instructions   Discharge Instructions    Diet general   Complete by:  As directed    Increase activity slowly   Complete by:  As directed      Allergies as of 06/20/2017   No Known Allergies     Medication List    TAKE these medications   buPROPion 150 MG 24 hr tablet Commonly known as:  WELLBUTRIN XL Take 1 tablet (150 mg total) by mouth daily. Start taking on:  06/21/2017   hydrOXYzine 25 MG tablet Commonly known as:  ATARAX/VISTARIL Take 1 tablet (25 mg total) by mouth 3 (three) times daily as needed for anxiety. What changed:  when to take this   naproxen sodium 220 MG tablet Commonly known as:  ALEVE Take 220 mg by mouth 2 (two) times daily as needed (pain).   nicotine 21 mg/24hr patch Commonly known as:  NICODERM CQ - dosed in mg/24 hours Place 1 patch (21 mg total) onto the skin daily.   polyethylene glycol packet Commonly known as:  MIRALAX / GLYCOLAX Take 17 g by mouth daily.   traZODone 50 MG tablet Commonly known as:  DESYREL Take 1 tablet (50 mg total) by mouth at bedtime as needed for sleep.      No Known Allergies Follow-up Information    MD AT [facility] Follow up.            The results of significant diagnostics from this hospitalization (including imaging, microbiology, ancillary and laboratory) are listed below for reference.    Significant Diagnostic Studies: Ct Abdomen Pelvis W Contrast  Result Date: 06/18/2017 CLINICAL DATA:  Acute left lower quadrant abdominal pain. EXAM: CT ABDOMEN  AND PELVIS WITH CONTRAST TECHNIQUE: Multidetector CT imaging of the abdomen and pelvis was performed using the standard protocol following bolus administration of intravenous contrast. CONTRAST:  ISOVUE-300 IOPAMIDOL (ISOVUE-300) INJECTION 61% COMPARISON:  None. FINDINGS: Lower chest: No acute abnormality. Hepatobiliary: No focal liver abnormality is seen. No gallstones, gallbladder wall thickening, or biliary dilatation. Pancreas: Unremarkable. No pancreatic ductal dilatation or surrounding inflammatory changes. Spleen: Normal in size without focal abnormality. Adrenals/Urinary Tract: Adrenal glands are unremarkable. Kidneys are normal, without renal calculi, focal lesion, or hydronephrosis. Bladder is unremarkable. Stomach/Bowel: The appendix appears normal. Moderate gastric distention is noted. Diffuse small bowel dilatation is noted with transition zone seen in the right lower quadrant best seen on image number 69 of series 2. Vascular/Lymphatic: No significant vascular findings are present. No enlarged abdominal or pelvic lymph nodes. Reproductive: Prostate is unremarkable. Other: No abdominal wall hernia or abnormality. No abdominopelvic ascites. Musculoskeletal: No acute or significant osseous findings. IMPRESSION: Small bowel dilatation is noted concerning for distal small bowel obstruction. Transition zone is noted in the right lower quadrant of uncertain etiology. Electronically Signed   By: Lupita Raider, M.D.   On:  06/18/2017 14:29   Acute Abdominal Series  Result Date: 06/19/2017 CLINICAL DATA:  Small bowel obstruction EXAM: DG ABDOMEN ACUTE W/ 1V CHEST COMPARISON:  CT abdomen and pelvis Jun 18, 2017; chest radiograph and chest CT December 25, 2016 FINDINGS: PA chest: There is slight bibasilar atelectasis. There is no edema or consolidation. Heart is upper normal in size with pulmonary vascularity normal. No abscess. Supine and left lateral decubitus abdomen: There are multiple loops of dilated  small bowel with air-fluid levels, consistent with persistent bowel obstruction. There is moderate air in the colon. No free air. There are small phleboliths in the pelvis. IMPRESSION: Evidence a degree of small bowel obstruction. Note that there is air in colon suggesting incomplete obstruction. No free air. No edema or consolidation. Slight bibasilar atelectasis. Electronically Signed   By: Bretta Bang III M.D.   On: 06/19/2017 09:48    Microbiology: No results found for this or any previous visit (from the past 240 hour(s)).   Labs: Basic Metabolic Panel: Recent Labs  Lab 06/17/17 2227 06/18/17 1251 06/19/17 0354 06/20/17 0618  NA 138 137 136 135  K 4.1 3.8 3.8 4.2  CL 107 101 104 104  CO2 21*  GLUCOSE 96 102* 106* 97  BUN <5*  CREATININE 0.92 1.01 1.01 0.97  CALCIUM 9.4 9.3 8.8* 8.7*  MG  --   --  1.7 2.1   Liver Function Tests: Recent Labs  Lab 06/17/17 2227 06/18/17 1251  AST 14* 16  ALT 17 17  ALKPHOS 64 67  BILITOT 1.3* 1.3*  PROT 8.3* 8.3*  ALBUMIN 4.1 4.1   Recent Labs  Lab 06/18/17 1251  LIPASE 25   No results for input(s): AMMONIA in the last 168 hours. CBC: Recent Labs  Lab 06/17/17 2227 06/18/17 1251 06/19/17 0354  WBC 10.4 13.1* 8.4  NEUTROABS  --  11.1*  --   HGB 16.1 16.6 14.9  HCT 48.4 50.7 45.9  MCV 81.5 81.5 82.0  PLT 211 214 174   Cardiac Enzymes: No results for input(s): CKTOTAL, CKMB, CKMBINDEX, TROPONINI in the last 168 hours. BNP: BNP (last 3 results) No results for input(s): BNP in the last 8760 hours.  ProBNP (last 3 results) No results for input(s): PROBNP in the last 8760 hours.  CBG: No results for input(s): GLUCAP in the last 168 hours.     Signed:  Ramiro Harvest MD.  [facility] 06/20/2017, 4:05 PM

## 2017-06-20 NOTE — Progress Notes (Signed)
General Surgery [facility]  Assessment & Plan: Partial SBO versus gastroenteritis  Full liquid diet tolerated - will advance to regular diet this AM  Encouraged OOB, ambulation  Will follow        Velora Heckler, MD, [facility]       Office: [phone]    Chief Complaint: Partial SBO symptoms  Subjective: Patient pleasant, no complaints, passing flatus, wants to eat.  Sitter in room.  Objective: Vital signs in last 24 hours: Temp:  [97.9 F (36.6 C)] 97.9 F (36.6 C) (05/04 0621) Pulse Rate:  [72] 72 (05/04 0621) Resp:  [20] 20 (05/04 0621) BP: (140)/(94) 140/94 (05/04 0621) SpO2:  [99 %] 99 % (05/04 0621) Last BM Date: 06/19/17  Intake/Output from previous day: 05/03 0701 - 05/04 0700 In: 2460 [P.O.:2160; IV Piggyback:300] Out: -  Intake/Output this shift: Total I/O In: 240 [P.O.:240] Out: -   Physical Exam: HEENT - sclerae clear, mucous membranes moist Neck - soft Chest - clear bilaterally Cor - RRR Abdomen - soft, obese, non-tender; BS present Ext - no edema, non-tender Neuro - alert & oriented, no focal deficits  Lab Results:  Recent Labs    06/18/17 1251 06/19/17 0354  WBC 13.1* 8.4  HGB 16.6 14.9  HCT 50.7 45.9  PLT 214 174   BMET Recent Labs    06/19/17 0354 06/20/17 0618  NA 136 135  K 3.8 4.2  CL 104 104  CO2 22 21*  GLUCOSE 106* 97  BUN 9 <5*  CREATININE 1.01 0.97  CALCIUM 8.8* 8.7*   PT/INR No results for input(s): LABPROT, INR in the last 72 hours. Comprehensive Metabolic Panel:    Component Value Date/Time   NA 135 06/20/2017 0618   NA 136 06/19/2017 0354   K 4.2 06/20/2017 0618   K 3.8 06/19/2017 0354   CL 104 06/20/2017 0618   CL 104 06/19/2017 0354   CO2 21 (L) 06/20/2017 0618   CO2 22 06/19/2017 0354   BUN <5 (L) 06/20/2017 0618   BUN 9 06/19/2017 0354   CREATININE 0.97 06/20/2017 0618   CREATININE 1.01 06/19/2017 0354   GLUCOSE 97 06/20/2017 0618   GLUCOSE 106 (H) 06/19/2017 0354   CALCIUM 8.7 (L) 06/20/2017 0618   CALCIUM 8.8 (L) 06/19/2017 0354   AST 16 06/18/2017 1251   AST 14 (L) 06/17/2017 2227   ALT 17 06/18/2017 1251   ALT 17 06/17/2017 2227   ALKPHOS 67 06/18/2017 1251   ALKPHOS 64 06/17/2017 2227   BILITOT 1.3 (H) 06/18/2017 1251   BILITOT 1.3 (H) 06/17/2017 2227   PROT 8.3 (H) 06/18/2017 1251   PROT 8.3 (H) 06/17/2017 2227   ALBUMIN 4.1 06/18/2017 1251   ALBUMIN 4.1 06/17/2017 2227    Studies/Results: Ct Abdomen Pelvis W Contrast  Result Date: 06/18/2017 CLINICAL DATA:  Acute left lower quadrant abdominal pain. EXAM: CT ABDOMEN AND PELVIS WITH CONTRAST TECHNIQUE: Multidetector CT imaging of the abdomen and pelvis was performed using the standard protocol following bolus administration of intravenous contrast. CONTRAST:  ISOVUE-300 IOPAMIDOL (ISOVUE-300) INJECTION 61% COMPARISON:  None. FINDINGS: Lower chest: No acute abnormality. Hepatobiliary: No focal liver abnormality is seen. No gallstones, gallbladder wall thickening, or biliary dilatation. Pancreas: Unremarkable. No pancreatic ductal dilatation or surrounding inflammatory changes. Spleen: Normal in size without focal abnormality. Adrenals/Urinary Tract: Adrenal glands are unremarkable. Kidneys are normal, without renal calculi, focal lesion, or hydronephrosis. Bladder is unremarkable.  Stomach/Bowel: The appendix appears normal. Moderate gastric distention is noted. Diffuse small bowel dilatation is noted with transition zone seen in the right lower quadrant best seen on image number 69 of series 2. Vascular/Lymphatic: No significant vascular findings are present. No enlarged abdominal or pelvic lymph nodes. Reproductive: Prostate is unremarkable. Other: No abdominal wall hernia or abnormality. No abdominopelvic ascites. Musculoskeletal: No acute or significant osseous findings. IMPRESSION: Small bowel dilatation is noted concerning for distal small bowel obstruction.  Transition zone is noted in the right lower quadrant of uncertain etiology. Electronically Signed   By: Lupita Raider, M.D.   On: 06/18/2017 14:29   Acute Abdominal Series  Result Date: 06/19/2017 CLINICAL DATA:  Small bowel obstruction EXAM: DG ABDOMEN ACUTE W/ 1V CHEST COMPARISON:  CT abdomen and pelvis Jun 18, 2017; chest radiograph and chest CT December 25, 2016 FINDINGS: PA chest: There is slight bibasilar atelectasis. There is no edema or consolidation. Heart is upper normal in size with pulmonary vascularity normal. No abscess. Supine and left lateral decubitus abdomen: There are multiple loops of dilated small bowel with air-fluid levels, consistent with persistent bowel obstruction. There is moderate air in the colon. No free air. There are small phleboliths in the pelvis. IMPRESSION: Evidence a degree of small bowel obstruction. Note that there is air in colon suggesting incomplete obstruction. No free air. No edema or consolidation. Slight bibasilar atelectasis. Electronically Signed   By: Bretta Bang III M.D.   On: 06/19/2017 09:48      Julie Nay M 06/20/2017  Patient ID: [name], male   DOB: 1975/09/04, 42 y.o.   MRN: [id number]
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
## 2017-06-21 DIAGNOSIS — R45851 Suicidal ideations: Secondary | ICD-10-CM

## 2017-06-21 DIAGNOSIS — Z813 Family history of other psychoactive substance abuse and dependence: Secondary | ICD-10-CM

## 2017-06-21 DIAGNOSIS — Z62811 Personal history of psychological abuse in childhood: Secondary | ICD-10-CM

## 2017-06-21 DIAGNOSIS — Z62810 Personal history of physical and sexual abuse in childhood: Secondary | ICD-10-CM

## 2017-06-21 DIAGNOSIS — F17210 Nicotine dependence, cigarettes, uncomplicated: Secondary | ICD-10-CM

## 2017-06-21 DIAGNOSIS — G4700 Insomnia, unspecified: Secondary | ICD-10-CM

## 2017-06-21 DIAGNOSIS — F1490 Cocaine use, unspecified, uncomplicated: Secondary | ICD-10-CM

## 2017-06-21 DIAGNOSIS — R450 Nervousness: Secondary | ICD-10-CM

## 2017-06-21 DIAGNOSIS — F332 Major depressive disorder, recurrent severe without psychotic features: Principal | ICD-10-CM

## 2017-06-21 DIAGNOSIS — Z811 Family history of alcohol abuse and dependence: Secondary | ICD-10-CM

## 2017-06-21 DIAGNOSIS — F419 Anxiety disorder, unspecified: Secondary | ICD-10-CM

## 2017-06-21 DIAGNOSIS — F4310 Post-traumatic stress disorder, unspecified: Secondary | ICD-10-CM

## 2017-06-21 LAB — TSH: TSH: 3.336 u[IU]/mL (ref 0.350–4.500)

## 2017-06-21 MED ORDER — SERTRALINE HCL 25 MG PO TABS
25.0000 mg | ORAL_TABLET | Freq: Every day | ORAL | Status: DC
  Administered 2017-06-22: 25 mg via ORAL
  Filled 2017-06-21 (×3): qty 1

## 2017-06-21 MED ORDER — IBUPROFEN 400 MG PO TABS
ORAL_TABLET | ORAL | Status: AC
  Filled 2017-06-21: qty 1

## 2017-06-21 MED ORDER — TRAZODONE HCL 50 MG PO TABS: 50.0000 mg | ORAL_TABLET | Freq: Every evening | ORAL | Status: DC | PRN

## 2017-06-21 MED ORDER — IBUPROFEN 400 MG PO TABS
400.0000 mg | ORAL_TABLET | Freq: Three times a day (TID) | ORAL | Status: DC | PRN
  Administered 2017-06-21: 400 mg via ORAL
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
## 2017-06-21 NOTE — B[facility] Group Notes (Signed)
06/21/2017                   Type of Therapy and Topic:  Group Therapy: Anger Cues and Responses  Participation Level:  Did Not Attend  In this group, patients learned how to define boundaries, discussed the different types or boundaries with examples.  They identified times that boundaries had been violated and how they reacted.  They analyzed how their reaction was possibly beneficial and how it was possibly unhelpful.  The group discussed how to set boundaries, respect others boundaries and communicate their boundaries. The group utilized a role play age appropriate scenario and discussed how each person in the scenario could have reacted differently. Patients will explore discussion questions that address media influence and why it is hard to set boundaries.   Therapeutic Goals: 1. Patients will define boundaries and explore (physical, personal space and language boundaries)  2. Patients will remember their last incident where their boundaries were violated and how they behaved 3. Patients will practice empathy and understanding of other's boundaries and learn from others in group 4. Patients will explore how they may have crossed another person's boundaries in the past.  5. Patients will learn healthy ways to set and communicate boundaries. 6. Patients will actively engage in group utilizing role play   Summary of Patient Progress:  Patient was encouraged and invited to attend group. Patient did not attend group. Social worker will continue to encourage group participation in the future.     Therapeutic Modalities:   Cognitive Behavioral Therapy  Johny Shears, LCSW  06/21/2017 12:08 PM

## 2017-06-21 NOTE — [facility] Group Notes (Signed)
[facility] Group Notes:  (Nursing)  Date:  06/21/2017  Time:  900 am  Type of Therapy:  Orientation/Goals  Participation Level:  Did Not Attend  Participation Quality:  Did not attend  Affect:  Did not attend  Cognitive:  Did not attend  Insight:  None  Engagement in Group:  None  Modes of Intervention:  Did not attend  Summary of Progress/Problems:  Jose Hays 06/21/2017, 10:23 AM
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
## 2017-06-21 NOTE — Progress Notes (Signed)
Pt did not attend group this evening.  

## 2017-06-21 NOTE — Progress Notes (Signed)
D. Pt stayed in bed sleeping for most of the morning- did not want to attend am group.  Pt currently denies SI/HI and AVH  A. Labs and vitals monitored. Pt compliant with medications. Pt supported emotionally and encouraged to express concerns and ask questions.   R. Pt remains safe with 15 minute checks. Will continue POC.

## 2017-06-22 DIAGNOSIS — F329 Major depressive disorder, single episode, unspecified: Secondary | ICD-10-CM

## 2017-06-22 DIAGNOSIS — F1290 Cannabis use, unspecified, uncomplicated: Secondary | ICD-10-CM

## 2017-06-22 DIAGNOSIS — Z634 Disappearance and death of family member: Secondary | ICD-10-CM

## 2017-06-22 MED ORDER — PRAZOSIN HCL 1 MG PO CAPS
1.0000 mg | ORAL_CAPSULE | Freq: Every day | ORAL | Status: DC
  Administered 2017-06-22 – 2017-06-27 (×6): 1 mg via ORAL
  Filled 2017-06-22 (×9): qty 1

## 2017-06-22 MED ORDER — GABAPENTIN 100 MG PO CAPS
100.0000 mg | ORAL_CAPSULE | Freq: Three times a day (TID) | ORAL | Status: DC
  Administered 2017-06-22 – 2017-06-25 (×8): 100 mg via ORAL
  Filled 2017-06-22 (×13): qty 1

## 2017-06-22 MED ORDER — SERTRALINE HCL 50 MG PO TABS
50.0000 mg | ORAL_TABLET | Freq: Every day | ORAL | Status: DC
  Administered 2017-06-23 – 2017-06-27 (×5): 50 mg via ORAL
  Filled 2017-06-22 (×7): qty 1

## 2017-06-22 MED ORDER — TRAZODONE HCL 100 MG PO TABS
100.0000 mg | ORAL_TABLET | Freq: Every evening | ORAL | Status: DC | PRN
  Administered 2017-06-22 – 2017-06-26 (×5): 100 mg via ORAL
  Filled 2017-06-22 (×15): qty 1

## 2017-06-22 NOTE — Plan of Care (Signed)
Pt progressing toward d/c

## 2017-06-22 NOTE — [facility] Group Notes (Signed)
[facility] Group Notes:  (Nursing/MHT/Case Management/Adjunct)  Date:  06/22/2017  Time:  1600  Type of Therapy:  Nurse Education - Positive Self Talk to Promote Wellness  Participation Level:  None  Participation Quality:  Inattentive  Affect:  Flat and Irritable  Cognitive:  Alert  Insight:  None  Engagement in Group:  None  Modes of Intervention:  Education and Support  Summary of Progress/Problems: Patient attended group however did not participate in discussion nor did he contribute. Little to no eye contact made by patient.  Merian Capron [facility] 06/22/2017, 4:40 PM

## 2017-06-22 NOTE — Progress Notes (Signed)
D: Patient observed resting in bed this AM. States he "did not sleep a wink." Per review of chart, patient did not request any prns for sleep nor did he notify staff of his difficulties.  Patient's affect angry and irritable with congruent mood. Angry with this writer that he was awakened for meds. "First it was vital signs, now you and soon it will be group."  Denies pain however complains of constipation. "I've tried everything. I've not gone for several days."   A: Medicated per orders, prn MOM offered however patient refused. Encouraged to speak with provider as he becomes angry nothing else is ordered.  Level III obs in place for safety. Emotional support offered. Self inventory and Suicide Safety Plan completion encouraged. Discussed POC with MD, SW.   R: Patient verbalizes understanding of POC. Patient denies SI/HI/AVH and remains safe on level III obs. Will continue to monitor closely and make verbal contact frequently. Will redirect behavior as needed.

## 2017-06-22 NOTE — Progress Notes (Signed)
Nursing note 7p-7a  Pt observed interacting with peers on unit this shift. Displayed a flat affect and pleasant mood upon interaction with this Clinical research associate. Pt denies pain ,SI/HI, also denies audio or visual hallucinations at this time.  Pt continues to remain safe on the unit and is observed by rounding every 15 min. Pt able to contract for safety. Pt now resting in bed with eyes closed. RN will continue to monitor.

## 2017-06-22 NOTE — Progress Notes (Signed)
The focus of this group is to help patients review their daily goal of treatment and discuss progress on daily workbooks. Pt attended the evening group session and responded to all discussion prompts from the Writer. Pt shared that today was a good day on the unit, the highlight of which was hearing his young daughter's voice on the telephone. Pt also mentioned having enjoyed going to the courtyard for fresh air.  Pt told that his goal this week is to find new coping skills for grief and loss. "I'm open to suggestions because what I've been doing hasn't been working." Frankey feels like his suicidal thoughts will go away if he is able to properly manage his grief.  Pt rated his day a 5 out of 10 and his affect was appropriate.

## 2017-06-22 NOTE — Progress Notes (Addendum)
[facility] MD Progress Note  06/22/2017 12:04 PM Jose Hays  MRN:  [id number]  Subjective: Jose Hays is awake alert and oriented x3. Seen attending group sessions.  Presents irritable, agitated and guarded with questions during the assessment.  Continue to report feelings of hopelessness and despair.  States he is having increased anxiety and is unsure how to  grieved properly.  Reports he is grieving the loss of his brother over a year ago.  [name] reports he does not feel like he has a good support system outside of the hospital.  Reports multiple inpatient admissions however states he does not follow-up after.  Discussed medication adjustment to Zoloft patient encouraged to utilize Vistaril 25 mg as needed for anxiety symptoms.  Reports attending group sessions however states he does not feel as if he is learning anything from the sessions.  Rates his depression 8/ 10 during this assessment support and encouragement and reassurance was provided.  History: from assessment note- Jose Hays an 42 y.o.malewho presents voluntarilyreporting primary symptoms of increasing depression, SI with a plan to borrow a friend's gun and shoot himself.Pt acknowledges symptoms including social withdrawal, loss of interest in usual pleasures, decreased concentration, fatigue, irritability, decreased sleep, decreased appetite and feelings of hopelessness.    Principal Problem: MDD (major depressive disorder) Diagnosis:   Patient Active Problem List   Diagnosis Date Noted  . MDD (major depressive disorder) [F32.9] 06/20/2017  . Small bowel obstruction (HCC) [K56.609] 06/19/2017  . MDD (major depressive disorder), recurrent episode, severe (HCC) [F33.2] 06/18/2017  . Morbid obesity (HCC) [E66.01] 06/18/2017  . SBO (small bowel obstruction) (HCC) [K56.609] 06/18/2017  . Suicidal ideation [R45.851] 06/18/2017  . Homeless [Z59.0] 06/18/2017  . Cocaine use disorder, mild, abuse (HCC) [F14.10] 08/19/2016  . PTSD  (post-traumatic stress disorder) [F43.10] 08/19/2016  . Right forearm injury [S59.911A] 08/19/2016  . Major depressive disorder, recurrent severe without psychotic features (HCC) [F33.2] 09/08/2014  . Cocaine abuse with cocaine-induced mood disorder [facility]) [F14.14] 09/08/2014   Total Time spent with patient: 20 minutes  Past Psychiatric History:   Past Medical History:  Past Medical History:  Diagnosis Date  . Coronary artery disease   . Hypertension    History reviewed. No pertinent surgical history. Family History:  Family History  Problem Relation Age of Onset  . Alcohol abuse Father    Family Psychiatric  History:  Social History:  Social History   Substance and Sexual Activity  Alcohol Use No     Social History   Substance and Sexual Activity  Drug Use Yes  . Types: Cocaine, Marijuana    Social History   Socioeconomic History  . Marital status: Single    Spouse name: Not on file  . Number of children: Not on file  . Years of education: Not on file  . Highest education level: Not on file  Occupational History  . Not on file  Social Needs  . Financial resource strain: Not on file  . Food insecurity:    Worry: Not on file    Inability: Not on file  . Transportation needs:    Medical: Not on file    Non-medical: Not on file  Tobacco Use  . Smoking status: Current Every Day Smoker    Packs/day: 0.25  . Smokeless tobacco: Never Used  Substance and Sexual Activity  . Alcohol use: No  . Drug use: Yes    Types: Cocaine, Marijuana  . Sexual activity: Not on file  Lifestyle  . Physical activity:  Days per week: Not on file    Minutes per session: Not on file  . Stress: Not on file  Relationships  . Social connections:    Talks on phone: Not on file    Gets together: Not on file    Attends religious service: Not on file    Active member of club or organization: Not on file    Attends meetings of clubs or organizations: Not on file    Relationship  status: Not on file  Other Topics Concern  . Not on file  Social History Narrative  . Not on file   Additional Social History:                         Sleep: Fair  Appetite:  Fair  Current Medications: Current Facility-Administered Medications  Medication Dose Route Frequency Provider Last Rate Last Dose  . acetaminophen (TYLENOL) tablet 650 mg  650 mg Oral Q6H PRN Oneta Rack, NP      . alum & mag hydroxide-simeth (MAALOX/MYLANTA) 200-200-20 MG/5ML suspension 30 mL  30 mL Oral Q4H PRN Oneta Rack, NP      . buPROPion (WELLBUTRIN XL) 24 hr tablet 300 mg  300 mg Oral Daily Oneta Rack, NP   300 mg at 06/22/17 4098  . hydrOXYzine (ATARAX/VISTARIL) tablet 25 mg  25 mg Oral TID PRN Oneta Rack, NP   25 mg at 06/21/17 1719  . ibuprofen (ADVIL,MOTRIN) tablet 400 mg  400 mg Oral TID PRN Cobos, Rockey Situ, MD   400 mg at 06/21/17 1723  . magnesium hydroxide (MILK OF MAGNESIA) suspension 30 mL  30 mL Oral Daily PRN Oneta Rack, NP      . nicotine (NICODERM CQ - dosed in mg/24 hours) patch 21 mg  21 mg Transdermal Daily Cobos, Fernando A, MD      . ondansetron (ZOFRAN) tablet 4 mg  4 mg Oral Q8H PRN Oneta Rack, NP      . sertraline (ZOLOFT) tablet 25 mg  25 mg Oral Daily Cobos, Rockey Situ, MD   25 mg at 06/22/17 1191  . traZODone (DESYREL) tablet 50 mg  50 mg Oral QHS PRN Cobos, Rockey Situ, MD        Lab Results:  Results for orders placed or performed during the hospital encounter of 06/20/17 (from the past 48 hour(s))  TSH     Status: None   Collection Time: 06/21/17  6:18 AM  Result Value Ref Range   TSH 3.336 0.350 - 4.500 uIU/mL    Comment: Performed by a 3rd Generation assay with a functional sensitivity of <=0.01 uIU/mL. Performed at [facility], [address], [place], [place] [address]     Blood Alcohol level:  Lab Results  Component Value Date   ETH <10 06/17/2017   ETH <5 11/10/2016    Metabolic Disorder Labs: Lab  Results  Component Value Date   HGBA1C 5.5 08/20/2016   MPG 111 08/20/2016   No results found for: PROLACTIN Lab Results  Component Value Date   CHOL 204 (H) 08/20/2016   TRIG 80 08/20/2016   HDL 41 08/20/2016   CHOLHDL 5.0 08/20/2016   VLDL 16 08/20/2016   LDLCALC 147 (H) 08/20/2016    Physical Findings: AIMS: Facial and Oral Movements Muscles of Facial Expression: None, normal Lips and Perioral Area: None, normal Jaw: None, normal Tongue: None, normal,Extremity Movements Upper (arms, wrists, hands, fingers): None,  normal Lower (legs, knees, ankles, toes): None, normal, Trunk Movements Neck, shoulders, hips: None, normal, Overall Severity Severity of abnormal movements (highest score from questions above): None, normal Incapacitation due to abnormal movements: None, normal Patient's awareness of abnormal movements (rate only patient's report): No Awareness, Dental Status Current problems with teeth and/or dentures?: No Does patient usually wear dentures?: No  CIWA:    COWS:     Musculoskeletal: Strength & Muscle Tone: within normal limits Gait & Station: normal Patient leans: N/A  Psychiatric Specialty Exam: Physical Exam  Nursing note and vitals reviewed. Constitutional: He is oriented to person, place, and time.  Neurological: He is alert and oriented to person, place, and time.  Psychiatric: He has a normal mood and affect. His behavior is normal.    Review of Systems  Psychiatric/Behavioral: Positive for depression. The patient is nervous/anxious.   All other systems reviewed and are negative.   Blood pressure 120/81, pulse 97, temperature 98.3 F (36.8 C), temperature source Oral, resp. rate 18, height  (1.905 m), weight (!) 158.8 kg (350 lb), SpO2 (!) 84 %.Body mass index is 43.75 kg/m.  General Appearance: Guarded  Eye Contact:  Fair  Speech:  Clear and Coherent  Volume:  Normal  Mood:  Depressed and Irritable  Affect:  Congruent, Depressed and  Flat  Thought Process:  Coherent  Orientation:  Full (Time, Place, and Person)  Thought Content:  Hallucinations: None  Suicidal Thoughts:  Yes.  without intent/plan passive ideations patient is able to contract for safety while on the unit.  Homicidal Thoughts:  No  Memory:  Immediate;   Fair Recent;   Fair Remote;   Fair  Judgement:  Fair  Insight:  Fair  Psychomotor Activity:  Normal  Concentration:  Concentration: Fair  Recall:  [employer] of Knowledge:  Fair  Language:  Good  Akathisia:  No  Handed:  Right  AIMS (if indicated):     Assets:  Communication Skills Desire for Improvement Resilience Social Support  ADL's:  Intact  Cognition:  WNL  Sleep:  Number of Hours: 6.25     Treatment Plan Summary: Daily contact with patient to assess and evaluate symptoms and progress in treatment and Medication management   Continue with current treatment plan on 06/22/2017 as listed below except for noted  MDD:  Continue Wellbutrin 300 XL daily  Increased Zoloft 25 mg to 50 mg p.o. Daily  Anxiety:  Continue Vistaril 25 mg p.o. 3 times daily as needed  Insomnia: Continue with Trazodone 100 mg for insomnia   Will continue to monitor vitals,medication compliance and treatment side effects while patient is here.   CSW will continue working on disposition.  Patient to participate in therapeutic milieu  Oneta Rack, NP 06/22/2017, 12:04 PM  06/22/17 4,00 PM Agree with NP progress note as above. Patient has history of R arm trauma and subsequent neuropathic pain in the area. States he had been taking Neurontin in the past with some improvement and does not remember having had side effects. Will start Neurontin 100 mgrs TID FCobos,MD

## 2017-06-22 NOTE — Tx Team (Signed)
Interdisciplinary Treatment and Diagnostic Plan Update  06/22/2017 Time of Session: 1051 Jose Hays MRN: [id number]  Principal Diagnosis: MDD (major depressive disorder)  Secondary Diagnoses: Principal Problem:   MDD (major depressive disorder)   Current Medications:  Current Facility-Administered Medications  Medication Dose Route Frequency Provider Last Rate Last Dose  . acetaminophen (TYLENOL) tablet 650 mg  650 mg Oral Q6H PRN Oneta Rack, NP      . alum & mag hydroxide-simeth (MAALOX/MYLANTA) 200-200-20 MG/5ML suspension 30 mL  30 mL Oral Q4H PRN Oneta Rack, NP      . buPROPion (WELLBUTRIN XL) 24 hr tablet 300 mg  300 mg Oral Daily Oneta Rack, NP   300 mg at 06/22/17 4098  . hydrOXYzine (ATARAX/VISTARIL) tablet 25 mg  25 mg Oral TID PRN Oneta Rack, NP   25 mg at 06/21/17 1719  . ibuprofen (ADVIL,MOTRIN) tablet 400 mg  400 mg Oral TID PRN Cobos, Rockey Situ, MD   400 mg at 06/21/17 1723  . magnesium hydroxide (MILK OF MAGNESIA) suspension 30 mL  30 mL Oral Daily PRN Oneta Rack, NP      . nicotine (NICODERM CQ - dosed in mg/24 hours) patch 21 mg  21 mg Transdermal Daily Cobos, Fernando A, MD      . ondansetron (ZOFRAN) tablet 4 mg  4 mg Oral Q8H PRN Oneta Rack, NP      . Melene Muller ON 06/23/2017] sertraline (ZOLOFT) tablet 50 mg  50 mg Oral Daily Oneta Rack, NP      . traZODone (DESYREL) tablet 50 mg  50 mg Oral QHS PRN Cobos, Rockey Situ, MD       PTA Medications: Medications Prior to Admission  Medication Sig Dispense Refill Last Dose  . buPROPion (WELLBUTRIN XL) 150 MG 24 hr tablet Take 1 tablet (150 mg total) by mouth daily. 30 tablet 0   . hydrOXYzine (ATARAX/VISTARIL) 25 MG tablet Take 1 tablet (25 mg total) by mouth 3 (three) times daily as needed for anxiety. 20 tablet 0   . naproxen sodium (ALEVE) 220 MG tablet Take 220 mg by mouth 2 (two) times daily as needed (pain).   Past Week at Unknown time  . nicotine (NICODERM CQ - DOSED IN MG/24 HOURS) 21  mg/24hr patch Place 1 patch (21 mg total) onto the skin daily. 28 patch 0   . polyethylene glycol (MIRALAX / GLYCOLAX) packet Take 17 g by mouth daily. 14 each 0   . traZODone (DESYREL) 50 MG tablet Take 1 tablet (50 mg total) by mouth at bedtime as needed for sleep. 20 tablet 0     Patient Stressors: Marital or family conflict Medication change or noncompliance  Patient Strengths: Ability for insight Average or above average intelligence Capable of independent living General fund of knowledge  Treatment Modalities: Medication Management, Group therapy, Case management,  1 to 1 session with clinician, Psychoeducation, Recreational therapy.   Physician Treatment Plan for Primary Diagnosis: MDD (major depressive disorder) Long Term Goal(s): Improvement in symptoms so as ready for discharge Improvement in symptoms so as ready for discharge   Short Term Goals: Ability to identify changes in lifestyle to reduce recurrence of condition will improve Ability to verbalize feelings will improve Compliance with prescribed medications will improve Ability to disclose and discuss suicidal ideas Ability to identify and develop effective coping behaviors will improve  Medication Management: Evaluate patient's response, side effects, and tolerance of medication regimen.  Therapeutic Interventions: 1 to 1 sessions,  Unit Group sessions and Medication administration.  Evaluation of Outcomes: Progressing  Physician Treatment Plan for Secondary Diagnosis: Principal Problem:   MDD (major depressive disorder)  Long Term Goal(s): Improvement in symptoms so as ready for discharge Improvement in symptoms so as ready for discharge   Short Term Goals: Ability to identify changes in lifestyle to reduce recurrence of condition will improve Ability to verbalize feelings will improve Compliance with prescribed medications will improve Ability to disclose and discuss suicidal ideas Ability to identify and  develop effective coping behaviors will improve     Medication Management: Evaluate patient's response, side effects, and tolerance of medication regimen.  Therapeutic Interventions: 1 to 1 sessions, Unit Group sessions and Medication administration.  Evaluation of Outcomes: Progressing   RN Treatment Plan for Primary Diagnosis: MDD (major depressive disorder) Long Term Goal(s): Knowledge of disease and therapeutic regimen to maintain health will improve  Short Term Goals: Ability to identify and develop effective coping behaviors will improve and Compliance with prescribed medications will improve  Medication Management: RN will administer medications as ordered by provider, will assess and evaluate patient's response and provide education to patient for prescribed medication. RN will report any adverse and/or side effects to prescribing provider.  Therapeutic Interventions: 1 on 1 counseling sessions, Psychoeducation, Medication administration, Evaluate responses to treatment, Monitor vital signs and CBGs as ordered, Perform/monitor CIWA, COWS, AIMS and Fall Risk screenings as ordered, Perform wound care treatments as ordered.  Evaluation of Outcomes: Progressing   LCSW Treatment Plan for Primary Diagnosis: MDD (major depressive disorder) Long Term Goal(s): Safe transition to appropriate next level of care at discharge, Engage patient in therapeutic group addressing interpersonal concerns.  Short Term Goals: Engage patient in aftercare planning with referrals and resources, Increase social support and Increase skills for wellness and recovery  Therapeutic Interventions: Assess for all discharge needs, 1 to 1 time with Social worker, Explore available resources and support systems, Assess for adequacy in community support network, Educate family and significant other(s) on suicide prevention, Complete Psychosocial Assessment, Interpersonal group therapy.  Evaluation of Outcomes:  Progressing   Progress in Treatment: Attending groups: Yes. Participating in groups: Yes. Taking medication as prescribed: Yes. Toleration medication: Yes. Family/Significant other contact made: No, will contact:  when given permission Patient understands diagnosis: Yes. Discussing patient identified problems/goals with staff: Yes. Medical problems stabilized or resolved: Yes. Denies suicidal/homicidal ideation: Yes. Issues/concerns per patient self-inventory: No. Other: none  New problem(s) identified: No, Describe:  none  New Short Term/Long Term Goal(s):Pt goal: "want to feel better"  Discharge Plan or Barriers:   Reason for Continuation of Hospitalization: Depression Medication stabilization  Estimated Length of Stay: 3-5 days.  Attendees: Patient: Jose Hays 06/22/2017   Physician: Dr Jama Flavors, MD 06/22/2017   Nursing: Merian Capron, RN 06/22/2017   RN Care Manager: 06/22/2017   Social Worker: Daleen Squibb, LCSW 06/22/2017   Recreational Therapist:  06/22/2017   Other:  06/22/2017   Other:  06/22/2017   Other: 06/22/2017        Scribe for Treatment Team: Lorri Frederick, LCSW 06/22/2017 12:53 PM

## 2017-06-22 NOTE — Plan of Care (Signed)
Patient unwilling to participate in treatment. Blaming staff for "interrupting my sleep" and patient does not acknowledge importance of investment in treatment. Reports "I didn't sleep a wink last night." Patient did not ask for prn for sleep nor did he notify staff of insomnia.

## 2017-06-22 NOTE — [facility] Group Notes (Signed)
[facility] LCSW Group Therapy Note  Date/Time: 06/22/17, 1315  Type of Therapy and Topic:  Group Therapy:  Overcoming Obstacles  Participation Level:  Did not attend  Description of Group:    In this group patients will be encouraged to explore what they see as obstacles to their own wellness and recovery. They will be guided to discuss their thoughts, feelings, and behaviors related to these obstacles. The group will process together ways to cope with barriers, with attention given to specific choices patients can make. Each patient will be challenged to identify changes they are motivated to make in order to overcome their obstacles. This group will be process-oriented, with patients participating in exploration of their own experiences as well as giving and receiving support and challenge from other group members.  Therapeutic Goals: 1. Patient will identify personal and current obstacles as they relate to admission. 2. Patient will identify barriers that currently interfere with their wellness or overcoming obstacles.  3. Patient will identify feelings, thought process and behaviors related to these barriers. 4. Patient will identify two changes they are willing to make to overcome these obstacles:    Summary of Patient Progress      Therapeutic Modalities:   Cognitive Behavioral Therapy Solution Focused Therapy Motivational Interviewing Relapse Prevention Therapy  Daleen Squibb, LCSW

## 2017-06-22 NOTE — Progress Notes (Signed)
Recreation Therapy Notes  Date: 5.6.19 Time: 0930 Location: [address] Dayroom  Group Topic: Stress Management  Goal Area(s) Addresses:  Patient will verbalize importance of using healthy stress management.  Patient will identify positive emotions associated with healthy stress management.   Intervention: Stress Management  Activity :  Guided Imagery.  LRT introduced the stress management technique of guided imagery.  LRT read a script that guided patients through a peaceful outing in a meadow.  Patients were to follow along as LRT read script to engage in activity.  Education:  Stress Management, Discharge Planning.   Education Outcome: Acknowledges edcuation/In group clarification offered/Needs additional education  Clinical Observations/Feedback: Pt did not attend group.      Caroll Rancher, LRT/CTRS         Lillia Abed, Dameion Briles A 06/22/2017 10:59 AM

## 2017-06-23 DIAGNOSIS — F1210 Cannabis abuse, uncomplicated: Secondary | ICD-10-CM

## 2017-06-23 DIAGNOSIS — Z560 Unemployment, unspecified: Secondary | ICD-10-CM

## 2017-06-23 DIAGNOSIS — F1410 Cocaine abuse, uncomplicated: Secondary | ICD-10-CM

## 2017-06-23 NOTE — Progress Notes (Signed)
[facility] MD Progress Note  06/23/2017 4:12 PM [facility]  MRN:  [id number] Subjective: Patient is seen and examined.  Patient is a 42 year old male with a past psychiatric history significant for major depression, severe.  He seen in follow-up.  He was admitted on 06/21/2017.  He admitted to being socially withdrawn, depressed, helpless, hopeless and worthless.  He endorsed symptoms of suicidal ideation.  He has been on medications in the past, but it been noncompliant with them.  He had recently been discharged from [facility] and did not follow-up because he thought he was going to relocate to that area.  He is unable to work due to injuring his right arm 2 years ago.  His brother died 2 years ago.  He has a lack of social support.  He has a 75-year-old daughter he wanted to support.  He stated he feels essentially the same as he did on admission.  He continues on sertraline, Wellbutrin, gabapentin, hydroxyzine, prazosin and trazodone.  He still endorses suicidal ideation. Principal Problem: MDD (major depressive disorder) Diagnosis:   Patient Active Problem List   Diagnosis Date Noted  . MDD (major depressive disorder) [F32.9] 06/20/2017  . Small bowel obstruction (HCC) [K56.609] 06/19/2017  . MDD (major depressive disorder), recurrent episode, severe (HCC) [F33.2] 06/18/2017  . Morbid obesity (HCC) [E66.01] 06/18/2017  . SBO (small bowel obstruction) (HCC) [K56.609] 06/18/2017  . Suicidal ideation [R45.851] 06/18/2017  . Homeless [Z59.0] 06/18/2017  . Cocaine use disorder, mild, abuse (HCC) [F14.10] 08/19/2016  . PTSD (post-traumatic stress disorder) [F43.10] 08/19/2016  . Right forearm injury [S59.911A] 08/19/2016  . Major depressive disorder, recurrent severe without psychotic features (HCC) [F33.2] 09/08/2014  . Cocaine abuse with cocaine-induced mood disorder [facility]) [F14.14] 09/08/2014   Total Time spent with patient: 30 minutes  Past Psychiatric History: See admission H&P  Past Medical  History:  Past Medical History:  Diagnosis Date  . Coronary artery disease   . Hypertension    History reviewed. No pertinent surgical history. Family History:  Family History  Problem Relation Age of Onset  . Alcohol abuse Father    Family Psychiatric  History: See admission H&P Social History:  Social History   Substance and Sexual Activity  Alcohol Use No     Social History   Substance and Sexual Activity  Drug Use Yes  . Types: Cocaine, Marijuana    Social History   Socioeconomic History  . Marital status: Single    Spouse name: Not on file  . Number of children: Not on file  . Years of education: Not on file  . Highest education level: Not on file  Occupational History  . Not on file  Social Needs  . Financial resource strain: Not on file  . Food insecurity:    Worry: Not on file    Inability: Not on file  . Transportation needs:    Medical: Not on file    Non-medical: Not on file  Tobacco Use  . Smoking status: Current Every Day Smoker    Packs/day: 0.25  . Smokeless tobacco: Never Used  Substance and Sexual Activity  . Alcohol use: No  . Drug use: Yes    Types: Cocaine, Marijuana  . Sexual activity: Not on file  Lifestyle  . Physical activity:    Days per week: Not on file    Minutes per session: Not on file  . Stress: Not on file  Relationships  . Social connections:    Talks on phone: Not on file  Gets together: Not on file    Attends religious service: Not on file    Active member of club or organization: Not on file    Attends meetings of clubs or organizations: Not on file    Relationship status: Not on file  Other Topics Concern  . Not on file  Social History Narrative  . Not on file   Additional Social History:                         Sleep: Fair  Appetite:  Fair  Current Medications: Current Facility-Administered Medications  Medication Dose Route Frequency Provider Last Rate Last Dose  . acetaminophen (TYLENOL)  tablet 650 mg  650 mg Oral Q6H PRN Oneta Rack, NP      . alum & mag hydroxide-simeth (MAALOX/MYLANTA) 200-200-20 MG/5ML suspension 30 mL  30 mL Oral Q4H PRN Oneta Rack, NP      . buPROPion (WELLBUTRIN XL) 24 hr tablet 300 mg  300 mg Oral Daily Oneta Rack, NP   300 mg at 06/23/17 1038  . gabapentin (NEURONTIN) capsule 100 mg  100 mg Oral TID Cobos, Rockey Situ, MD   100 mg at 06/23/17 1510  . hydrOXYzine (ATARAX/VISTARIL) tablet 25 mg  25 mg Oral TID PRN Oneta Rack, NP   25 mg at 06/22/17 2004  . ibuprofen (ADVIL,MOTRIN) tablet 400 mg  400 mg Oral TID PRN Cobos, Rockey Situ, MD   400 mg at 06/21/17 1723  . magnesium hydroxide (MILK OF MAGNESIA) suspension 30 mL  30 mL Oral Daily PRN Oneta Rack, NP      . nicotine (NICODERM CQ - dosed in mg/24 hours) patch 21 mg  21 mg Transdermal Daily Cobos, Rockey Situ, MD   21 mg at 06/23/17 1040  . ondansetron (ZOFRAN) tablet 4 mg  4 mg Oral Q8H PRN Oneta Rack, NP      . prazosin (MINIPRESS) capsule 1 mg  1 mg Oral QHS Donell Sievert E, PA-C   1 mg at 06/22/17 2147  . sertraline (ZOLOFT) tablet 50 mg  50 mg Oral Daily Oneta Rack, NP   50 mg at 06/23/17 1038  . traZODone (DESYREL) tablet 100 mg  100 mg Oral QHS,MR X 1 Kerry Hough, PA-C   100 mg at 06/22/17 2147    Lab Results: No results found for this or any previous visit (from the past 48 hour(s)).  Blood Alcohol level:  Lab Results  Component Value Date   ETH <10 06/17/2017   ETH <5 11/10/2016    Metabolic Disorder Labs: Lab Results  Component Value Date   HGBA1C 5.5 08/20/2016   MPG 111 08/20/2016   No results found for: PROLACTIN Lab Results  Component Value Date   CHOL 204 (H) 08/20/2016   TRIG 80 08/20/2016   HDL 41 08/20/2016   CHOLHDL 5.0 08/20/2016   VLDL 16 08/20/2016   LDLCALC 147 (H) 08/20/2016    Physical Findings: AIMS: Facial and Oral Movements Muscles of Facial Expression: None, normal Lips and Perioral Area: None, normal Jaw: None,  normal Tongue: None, normal,Extremity Movements Upper (arms, wrists, hands, fingers): None, normal Lower (legs, knees, ankles, toes): None, normal, Trunk Movements Neck, shoulders, hips: None, normal, Overall Severity Severity of abnormal movements (highest score from questions above): None, normal Incapacitation due to abnormal movements: None, normal Patient's awareness of abnormal movements (rate only patient's report): No Awareness, Dental Status Current problems with teeth  and/or dentures?: No Does patient usually wear dentures?: No  CIWA:    COWS:     Musculoskeletal: Strength & Muscle Tone: within normal limits Gait & Station: normal Patient leans: N/A  Psychiatric Specialty Exam: Physical Exam  Nursing note and vitals reviewed. Constitutional: He is oriented to person, place, and time. He appears well-developed and well-nourished.  HENT:  Head: Normocephalic and atraumatic.  Respiratory: Effort normal.  Musculoskeletal: Normal range of motion.  Neurological: He is oriented to person, place, and time.    ROS  Blood pressure 126/74, pulse (!) 101, temperature 98 F (36.7 C), temperature source Oral, resp. rate 16, height  (1.905 m), weight (!) 158.8 kg (350 lb), SpO2 (!) 84 %.Body mass index is 43.75 kg/m.  General Appearance: Casual  Eye Contact:  Poor  Speech:  Normal Rate  Volume:  Decreased  Mood:  Depressed  Affect:  Congruent  Thought Process:  Coherent  Orientation:  Full (Time, Place, and Person)  Thought Content:  Logical  Suicidal Thoughts:  Yes.  without intent/plan  Homicidal Thoughts:  No  Memory:  Immediate;   Fair  Judgement:  Impaired  Insight:  Lacking  Psychomotor Activity:  Decreased  Concentration:  Concentration: Fair  Recall:  [employer] of Knowledge:  Fair  Language:  Fair  Akathisia:  Negative  Handed:  Right  AIMS (if indicated):     Assets:  Desire for Improvement  ADL's:  Intact  Cognition:  WNL  Sleep:  Number of Hours:  6.75     Treatment Plan Summary: Daily contact with patient to assess and evaluate symptoms and progress in treatment, Medication management and Plan Patient is seen and examined.  Patient is a 42 year old male with the above-stated past psychiatric history seen in follow-up.  He is really only been back on his medications for a couple of days.  I am not going to change any of his medications right now.  He admitted to continued suicidal thoughts, and his only goal is to get better for his daughter.  He would like to go to some long-term program, but I think things are limited.  We will discuss this with social work tomorrow.  Otherwise continue on current treatment.  Antonieta Pert, MD 06/23/2017, 4:12 PM

## 2017-06-23 NOTE — [facility] Group Notes (Signed)
Adult Psychoeducational Group Note  Date:  06/23/2017 Time:  10:05 PM  Group Topic/Focus:  Wrap-Up Group:   The focus of this group is to help patients review their daily goal of treatment and discuss progress on daily workbooks.  Participation Level:  Active  Participation Quality:  Appropriate and Attentive  Affect:  Appropriate  Cognitive:  Alert and Appropriate  Insight: Appropriate and Good  Engagement in Group:  Engaged  Modes of Intervention:  Discussion and Education  Additional Comments:  Pt attended and participated in wrap up group this evening. Pt had an "okay" day but would not elaborate more on what made their day "okay". Pt goal was to get rest because their meds get them to sleep, but do not keep him sleep.   Jose Hays 06/23/2017, 10:05 PM

## 2017-06-23 NOTE — Progress Notes (Signed)
D: Patient denies SI, HI or AVH. Patient presents as animated and anxious stating, "no nurse has made it through the night with me yet".  Pt. Elaborates stating that he has not been able to sleep despite medication reporting nightmares and waking frequently.  Spoke with provider who adjusted the patients medication for sleep.  Pt. States his day was otherwise "not too bad", he attended and participated in evening wrap up group.  Pt. Denied any physical complaint this evening.  A: Patient given emotional support from RN. Patient encouraged to come to staff with concerns and/or questions. Patient's medication routine continued. Patient's orders and plan of care reviewed.   R: Patient remains appropriate and cooperative. Will continue to monitor patient q15 minutes for safety.

## 2017-06-23 NOTE — Plan of Care (Signed)
Patient verbalizes understanding of information, education provided. 

## 2017-06-23 NOTE — [facility] Group Notes (Signed)
LCSW Group Therapy Note 06/23/2017 2:46 PM  Type of Therapy/Topic: Group Therapy: Feelings about Diagnosis  Participation Level: Did Not Attend   Description of Group:  This group will allow patients to explore their thoughts and feelings about diagnoses they have received. Patients will be guided to explore their level of understanding and acceptance of these diagnoses. Facilitator will encourage patients to process their thoughts and feelings about the reactions of others to their diagnosis and will guide patients in identifying ways to discuss their diagnosis with significant others in their lives. This group will be process-oriented, with patients participating in exploration of their own experiences, giving and receiving support, and processing challenge from other group members.  Therapeutic Goals: 1. Patient will demonstrate understanding of diagnosis as evidenced by identifying two or more symptoms of the disorder 2. Patient will be able to express two feelings regarding the diagnosis 3. Patient will demonstrate their ability to communicate their needs through discussion and/or role play  Summary of Patient Progress:  Invited, chose not to attend.    Therapeutic Modalities:  Cognitive Behavioral Therapy Brief Therapy Feelings Identification    [name] Catalina [place]ua Clinical Social Worker

## 2017-06-23 NOTE — Progress Notes (Signed)
D: Patient observed isolative to room a good bit of the day. Working on puzzles, crosswords. Does come to dayroom for very short intervals, snacks. Patient states he did sleep better last PM and had a BM. Patient's affect remains flat but patient much less irritable today. Endorsing hopelessness and when asked about SI patient states, "it's on the edge but I'm okay. It's mostly just wondering about the future and feeling uncertainty." Denies pain, physical complaints.   A: Medicated per orders, no prns requested or required. Level III obs in place for safety. Emotional support offered. Encouraged completion of Suicide Safety Plan, self inventory and encouraged patient to participate in programming. Discussed POC with MD, SW.   R: Patient verbalizes understanding of POC. Patient denies SI/HI/AVH and remains safe on level III obs. Will continue to monitor closely and make verbal contact frequently.

## 2017-06-23 NOTE — Progress Notes (Signed)
Recreation Therapy Notes  Animal-Assisted Activity (AAA) Program Checklist/Progress Notes Patient Eligibility Criteria Checklist & Daily Group note for Rec Tx Intervention  Date: 5.7.19 Time: 1445 Location: [address] Dayroom   AAA/T Program Assumption of Risk Form signed by Patient/ or Parent Legal Guardian YES   Patient is free of allergies or sever asthma YES   Patient reports no fear of animals YES   Patient reports no history of cruelty to animals YES   Patient understands his/her participation is voluntary YES   Patient washes hands before animal contact YES   Patient washes hands after animal contact YES   Behavioral Response: Engaged  Education: Hand Washing, Appropriate Animal Interaction   Education Outcome: Acknowledges understanding/In group clarification offered/Needs additional education.   Clinical Observations/Feedback: Pt did not attend group.    Trev Boley, LRT/CTRS         Tyreese Thain A 06/23/2017 3:59 PM 

## 2017-06-23 NOTE — [facility] Group Notes (Signed)
Pt was invited but did not attend orientation/goals group. 

## 2017-06-24 NOTE — Progress Notes (Signed)
[facility] MD Progress Note  06/24/2017 2:38 PM Jose Hays  MRN:  [id number] Subjective: Patient is seen and examined.  Patient is a 42 year old male with a past psychiatric history significant for depression, posttraumatic stress disorder and suicidal ideation.  He seen in follow-up.  He is doing a little bit better today.  He still very withdrawn, depressed and helpless.  But he is able to smile and engage a bit today.  His suicidal ideation has decreased but is still present.  We discussed compliance with his medications, his lack of social support, and his current medications. Principal Problem: MDD (major depressive disorder) Diagnosis:   Patient Active Problem List   Diagnosis Date Noted  . MDD (major depressive disorder) [F32.9] 06/20/2017  . Small bowel obstruction (HCC) [K56.609] 06/19/2017  . MDD (major depressive disorder), recurrent episode, severe (HCC) [F33.2] 06/18/2017  . Morbid obesity (HCC) [E66.01] 06/18/2017  . SBO (small bowel obstruction) (HCC) [K56.609] 06/18/2017  . Suicidal ideation [R45.851] 06/18/2017  . Homeless [Z59.0] 06/18/2017  . Cocaine use disorder, mild, abuse (HCC) [F14.10] 08/19/2016  . PTSD (post-traumatic stress disorder) [F43.10] 08/19/2016  . Right forearm injury [S59.911A] 08/19/2016  . Major depressive disorder, recurrent severe without psychotic features (HCC) [F33.2] 09/08/2014  . Cocaine abuse with cocaine-induced mood disorder [facility]) [F14.14] 09/08/2014   Total Time spent with patient: 30 minutes  Past Psychiatric History: See admission H&P  Past Medical History:  Past Medical History:  Diagnosis Date  . Coronary artery disease   . Hypertension    History reviewed. No pertinent surgical history. Family History:  Family History  Problem Relation Age of Onset  . Alcohol abuse Father    Family Psychiatric  History: See admission H&P Social History:  Social History   Substance and Sexual Activity  Alcohol Use No     Social History    Substance and Sexual Activity  Drug Use Yes  . Types: Cocaine, Marijuana    Social History   Socioeconomic History  . Marital status: Single    Spouse name: Not on file  . Number of children: Not on file  . Years of education: Not on file  . Highest education level: Not on file  Occupational History  . Not on file  Social Needs  . Financial resource strain: Not on file  . Food insecurity:    Worry: Not on file    Inability: Not on file  . Transportation needs:    Medical: Not on file    Non-medical: Not on file  Tobacco Use  . Smoking status: Current Every Day Smoker    Packs/day: 0.25  . Smokeless tobacco: Never Used  Substance and Sexual Activity  . Alcohol use: No  . Drug use: Yes    Types: Cocaine, Marijuana  . Sexual activity: Not on file  Lifestyle  . Physical activity:    Days per week: Not on file    Minutes per session: Not on file  . Stress: Not on file  Relationships  . Social connections:    Talks on phone: Not on file    Gets together: Not on file    Attends religious service: Not on file    Active member of club or organization: Not on file    Attends meetings of clubs or organizations: Not on file    Relationship status: Not on file  Other Topics Concern  . Not on file  Social History Narrative  . Not on file   Additional Social History:  Sleep: Fair  Appetite:  Good  Current Medications: Current Facility-Administered Medications  Medication Dose Route Frequency Provider Last Rate Last Dose  . acetaminophen (TYLENOL) tablet 650 mg  650 mg Oral Q6H PRN Oneta Rack, NP      . alum & mag hydroxide-simeth (MAALOX/MYLANTA) 200-200-20 MG/5ML suspension 30 mL  30 mL Oral Q4H PRN Oneta Rack, NP      . buPROPion (WELLBUTRIN XL) 24 hr tablet 300 mg  300 mg Oral Daily Oneta Rack, NP   300 mg at 06/24/17 0759  . gabapentin (NEURONTIN) capsule 100 mg  100 mg Oral TID Cobos, Rockey Situ, MD   100 mg at  06/24/17 1321  . hydrOXYzine (ATARAX/VISTARIL) tablet 25 mg  25 mg Oral TID PRN Oneta Rack, NP   25 mg at 06/23/17 2151  . ibuprofen (ADVIL,MOTRIN) tablet 400 mg  400 mg Oral TID PRN Cobos, Rockey Situ, MD   400 mg at 06/21/17 1723  . magnesium hydroxide (MILK OF MAGNESIA) suspension 30 mL  30 mL Oral Daily PRN Oneta Rack, NP      . nicotine (NICODERM CQ - dosed in mg/24 hours) patch 21 mg  21 mg Transdermal Daily Cobos, Rockey Situ, MD   21 mg at 06/23/17 1040  . ondansetron (ZOFRAN) tablet 4 mg  4 mg Oral Q8H PRN Oneta Rack, NP      . prazosin (MINIPRESS) capsule 1 mg  1 mg Oral QHS Donell Sievert E, PA-C   1 mg at 06/23/17 2151  . sertraline (ZOLOFT) tablet 50 mg  50 mg Oral Daily Oneta Rack, NP   50 mg at 06/24/17 0759  . traZODone (DESYREL) tablet 100 mg  100 mg Oral QHS,MR X 1 Kerry Hough, PA-C   100 mg at 06/23/17 2151    Lab Results: No results found for this or any previous visit (from the past 48 hour(s)).  Blood Alcohol level:  Lab Results  Component Value Date   ETH <10 06/17/2017   ETH <5 11/10/2016    Metabolic Disorder Labs: Lab Results  Component Value Date   HGBA1C 5.5 08/20/2016   MPG 111 08/20/2016   No results found for: PROLACTIN Lab Results  Component Value Date   CHOL 204 (H) 08/20/2016   TRIG 80 08/20/2016   HDL 41 08/20/2016   CHOLHDL 5.0 08/20/2016   VLDL 16 08/20/2016   LDLCALC 147 (H) 08/20/2016    Physical Findings: AIMS: Facial and Oral Movements Muscles of Facial Expression: None, normal Lips and Perioral Area: None, normal Jaw: None, normal Tongue: None, normal,Extremity Movements Upper (arms, wrists, hands, fingers): None, normal Lower (legs, knees, ankles, toes): None, normal, Trunk Movements Neck, shoulders, hips: None, normal, Overall Severity Severity of abnormal movements (highest score from questions above): None, normal Incapacitation due to abnormal movements: None, normal Patient's awareness of abnormal  movements (rate only patient's report): No Awareness, Dental Status Current problems with teeth and/or dentures?: No Does patient usually wear dentures?: No  CIWA:    COWS:     Musculoskeletal: Strength & Muscle Tone: within normal limits Gait & Station: normal Patient leans: N/A  Psychiatric Specialty Exam: Physical Exam  Nursing note and vitals reviewed. Constitutional: He is oriented to person, place, and time. He appears well-developed and well-nourished.  HENT:  Head: Normocephalic and atraumatic.  Respiratory: Effort normal.  Neurological: He is oriented to person, place, and time.    ROS  Blood pressure 117/86, pulse 69, temperature 98  F (36.7 C), temperature source Oral, resp. rate 16, height  (1.905 m), weight (!) 158.8 kg (350 lb), SpO2 (!) 84 %.Body mass index is 43.75 kg/m.  General Appearance: Disheveled  Eye Contact:  Fair  Speech:  Normal Rate  Volume:  Normal  Mood:  Depressed  Affect:  Congruent  Thought Process:  Coherent  Orientation:  Full (Time, Place, and Person)  Thought Content:  Logical  Suicidal Thoughts:  Yes.  without intent/plan  Homicidal Thoughts:  No  Memory:  Immediate;   Fair  Judgement:  Intact  Insight:  Fair  Psychomotor Activity:  Normal  Concentration:  Concentration: Fair  Recall:  Fair  Fund of Knowledge:  Good  Language:  Good  Akathisia:  No  Handed:  Right  AIMS (if indicated):     Assets:  Communication Skills Desire for Improvement Resilience  ADL's:  Intact  Cognition:  WNL  Sleep:  Number of Hours: 6.75     Treatment Plan Summary: Daily contact with patient to assess and evaluate symptoms and progress in treatment, Medication management and Plan Patient is seen and examined.  Patient is a 42 year old male with the above-stated past psychiatric history who is seen in follow-up.  He is probably slightly improved from yesterday.  A little bit less irritable than he had been.  He continues to have depressive  symptoms as well as some fleeting suicidal ideation.  He has really only been on the 50 mg of Zoloft for 1 day.  I will leave that as is, and may increase that tomorrow.  The rest of his medications will remain as is.  Antonieta Pert, MD 06/24/2017, 2:38 PM

## 2017-06-24 NOTE — Progress Notes (Signed)
Patient ID: Jose Hays, male   DOB: 06-14-1975, 42 y.o.   MRN: [id number]  D: Patient with blunted affect and depressed mood, denies SI/HI/AVH, reports that his sleep quality last night was poor, and he appeared to be very tired this morning, and required multiple positive verbal reinforcements to come to the nurses' station for his medications.  Pt reports a fair appetite, reports a low energy level, and a poor concentration level.  Pt rates his depression as 7 (10 being the worst), rates his hopelessness level as 6 (10 being the worst), and rates his anxiety as 8 (10 being the worst).  Pt complained of back pain of 7/10 at start of shift, but refused offer of Tylenol or Motrin.    A: Patient was educated on all of his medications, verbalized understanding, and took all as scheduled.  Pt is being maintained on Q15 minute checks for safety.  R: Will continue to monitor on Q15 minute checks and will address any concerns as they arise.

## 2017-06-24 NOTE — ED Provider Notes (Signed)
[facility]-EMERGENCY DEPT Provider Note   CSN: [id number] Arrival date & time: 06/18/17  1027     History   Chief Complaint Chief Complaint  Patient presents with  . Abdominal Pain  . Diarrhea    HPI Jose Hays is a 42 y.o. male.  42 year old male with prior history of hypertension, depression, CAD, suicidal ideation who presents from [facility] for evaluation of abdominal pain.  Patient was seen yesterday at [facility] ED for evaluation of suicidal ideation.  He has been placed in the in [facility] for psychiatric evaluation.  Today he complained to staff there of diffuse abdominal pain.  He also reported associated nausea and diarrhea.  He denies any recent significant medical illness.  He denies associated fever.  The history is provided by the patient and medical records.  Abdominal Pain   This is a new problem. The current episode started 12 to 24 hours ago. The problem occurs constantly. The problem has been gradually worsening. The pain is located in the generalized abdominal region. The pain is moderate. Associated symptoms include diarrhea. Pertinent negatives include fever. Nothing aggravates the symptoms. Nothing relieves the symptoms.  Diarrhea   Associated symptoms include abdominal pain.    Past Medical History:  Diagnosis Date  . Coronary artery disease   . Hypertension     Patient Active Problem List   Diagnosis Date Noted  . MDD (major depressive disorder) 06/20/2017  . Small bowel obstruction (HCC) 06/19/2017  . MDD (major depressive disorder), recurrent episode, severe (HCC) 06/18/2017  . Morbid obesity (HCC) 06/18/2017  . SBO (small bowel obstruction) (HCC) 06/18/2017  . Suicidal ideation 06/18/2017  . Homeless 06/18/2017  . Cocaine use disorder, mild, abuse (HCC) 08/19/2016  . PTSD (post-traumatic stress disorder) 08/19/2016  . Right forearm injury 08/19/2016  . Major depressive disorder, recurrent severe without psychotic features (HCC) 09/08/2014    . Cocaine abuse with cocaine-induced mood disorder (HCC) 09/08/2014    History reviewed. No pertinent surgical history.      Home Medications    Prior to Admission medications   Medication Sig Start Date End Date Taking? Authorizing Provider  buPROPion (WELLBUTRIN XL) 150 MG 24 hr tablet Take 1 tablet (150 mg total) by mouth daily. 06/21/17   Rodolph Bong, MD  hydrOXYzine (ATARAX/VISTARIL) 25 MG tablet Take 1 tablet (25 mg total) by mouth 3 (three) times daily as needed for anxiety. 06/20/17   Rodolph Bong, MD  naproxen sodium (ALEVE) 220 MG tablet Take 220 mg by mouth 2 (two) times daily as needed (pain).    [provider]  nicotine (NICODERM CQ - DOSED IN MG/24 HOURS) 21 mg/24hr patch Place 1 patch (21 mg total) onto the skin daily. 06/20/17   Rodolph Bong, MD  polyethylene glycol [facility] / Ethelene Hal) packet Take 17 g by mouth daily. 06/20/17   Rodolph Bong, MD  traZODone (DESYREL) 50 MG tablet Take 1 tablet (50 mg total) by mouth at bedtime as needed for sleep. 06/20/17   Rodolph Bong, MD    Family History Family History  Problem Relation Age of Onset  . Alcohol abuse Father     Social History Social History   Tobacco Use  . Smoking status: Current Every Day Smoker    Packs/day: 0.25  . Smokeless tobacco: Never Used  Substance Use Topics  . Alcohol use: No  . Drug use: Yes    Types: Cocaine, Marijuana     Allergies   Patient has no  known allergies.   Review of Systems Review of Systems  Constitutional: Negative for fever.  Gastrointestinal: Positive for abdominal pain and diarrhea.  All other systems reviewed and are negative.    Physical Exam Updated Vital Signs BP (!) 138/97 (BP Location: Right Arm)   Pulse 73   Temp 98.1 F (36.7 C) (Oral)   Resp 20   Ht  (1.88 m)   Wt (!) 158 kg (348 lb 5.2 oz)   SpO2 98%   BMI 44.72 kg/m   Physical Exam  Constitutional: He is oriented to person, place, and time. He appears  well-developed and well-nourished. No distress.  HENT:  Head: Normocephalic and atraumatic.  Mouth/Throat: Oropharynx is clear and moist.  Eyes: Pupils are equal, round, and reactive to light. Conjunctivae and EOM are normal.  Neck: Normal range of motion. Neck supple.  Cardiovascular: Normal rate, regular rhythm and normal heart sounds.  Pulmonary/Chest: Effort normal and breath sounds normal. No respiratory distress.  Abdominal: Soft. He exhibits no distension. There is tenderness.  Mild diffuse abdominal tenderness  Musculoskeletal: Normal range of motion. He exhibits no edema or deformity.  Neurological: He is alert and oriented to person, place, and time.  Skin: Skin is warm and dry.  Psychiatric: He has a normal mood and affect.  Nursing note and vitals reviewed.    ED Treatments / Results  Labs (all labs ordered are listed, but only abnormal results are displayed) Labs Reviewed  COMPREHENSIVE METABOLIC PANEL - Abnormal; Notable for the following components:      Result Value   Glucose, Bld 102 (*)    Total Protein 8.3 (*)    Total Bilirubin 1.3 (*)    All other components within normal limits  CBC WITH DIFFERENTIAL/PLATELET - Abnormal; Notable for the following components:   WBC 13.1 (*)    RBC 6.22 (*)    Neutro Abs 11.1 (*)    All other components within normal limits  URINALYSIS, ROUTINE W REFLEX MICROSCOPIC - Abnormal; Notable for the following components:   Specific Gravity, Urine 1.032 (*)    Ketones, ur 5 (*)    Protein, ur 30 (*)    All other components within normal limits  LIPASE, BLOOD    EKG None  Radiology No results found.  Procedures Procedures (including critical care time)  Medications Ordered in ED Medications  ketorolac (TORADOL) 15 MG/ML injection 15 mg (15 mg Intravenous Given 06/18/17 1249)  gi cocktail (Maalox,Lidocaine,Donnatal) (30 mLs Oral Given 06/18/17 1249)  iopamidol (ISOVUE-300) 61 % injection 100 mL (100 mLs Intravenous Contrast  Given 06/18/17 1355)  morphine 4 MG/ML injection 4 mg (4 mg Intravenous Given 06/18/17 1454)     Initial Impression / Assessment and Plan / ED Course  I have reviewed the triage vital signs and the nursing notes.  Pertinent labs & imaging results that were available during my care of the patient were reviewed by me and considered in my medical decision making (see chart for details).     MDM   Screen Complete  42 year old male is presenting with generalized abdominal pain.  Screening labs reveal elevated white count at 13.  CT abdomen pelvis suggest a small bowel obstruction.  Patient is without any reported prior abdominal surgery.  He appears to be somewhat improved following initial ED management and treatment.  I will plan to admit this patient to the hospitalist service.  General surgery is aware of the case and will evaluate the patient given a possible SBO.  Final Clinical Impressions(s) / ED Diagnoses   Final diagnoses:  Generalized abdominal pain  Small bowel obstruction [facility])    ED Discharge Orders    None       Wynetta Fines, MD 06/24/17 1538

## 2017-06-24 NOTE — [facility] Group Notes (Signed)
Adult Psychoeducational Group Note  Date:  06/24/2017 Time:  10:49 PM  Group Topic/Focus:  Wrap-Up Group:   The focus of this group is to help patients review their daily goal of treatment and discuss progress on daily workbooks.  Participation Level:  Active  Participation Quality:  Appropriate and Attentive  Affect:  Appropriate  Cognitive:  Alert and Appropriate  Insight: Appropriate and Good  Engagement in Group:  Engaged  Modes of Intervention:  Discussion and Education  Additional Comments:  Pt attended and participated in wrap up group this evening. Pt had an okay day due to them sleeping well and getting a lot of rest, which was also their goal.  Chrisandra Netters 06/24/2017, 10:49 PM

## 2017-06-24 NOTE — Progress Notes (Signed)
Recreation Therapy Notes  Date: 5.8.19 Time: 0930 Location: 300 Hall Dayroom  Group Topic: Stress Management  Goal Area(s) Addresses:  Patient will verbalize importance of using healthy stress management.  Patient will identify positive emotions associated with healthy stress management.   Intervention: Stress Management  Activity :  Meditation.  LRT introduced the stress management technique of meditation.  LRT played Hays meditation that focused on the strength and resilience of mountains and how those characteristics can be used in daily life.  Patients were to follow along as meditation played.  Education:  Stress Management, Discharge Planning.   Education Outcome: Acknowledges edcuation/In group clarification offered/Needs additional education  Clinical Observations/Feedback: Pt did not attend group.      Jose Hays, LRT/CTRS         Jose Hays 06/24/2017 11:47 AM 

## 2017-06-24 NOTE — Progress Notes (Signed)
Patient ID: Jose Hays, male   DOB: 1975/10/19, 42 y.o.   MRN: [id number] Pt observed in the dayroom interacting with peer. Pt at assessment endorsed moderate anxiety and depression; "my depression and anxiety are both at 6." Pt verbally contracts for safety. Pt denied HI, pain, SI or AVH. Medications offered as prescribed. All patient's questions and concerns addressed. Support, encouragement, and safe environment provided. Will continue to monitor for any changes. 15-minute safety checks continue. Pt attended wrap-up group.

## 2017-06-25 MED ORDER — GABAPENTIN 300 MG PO CAPS
300.0000 mg | ORAL_CAPSULE | Freq: Three times a day (TID) | ORAL | Status: DC
  Administered 2017-06-25 – 2017-06-27 (×7): 300 mg via ORAL
  Filled 2017-06-25 (×13): qty 1

## 2017-06-25 NOTE — Progress Notes (Signed)
Patient ID: Jose Hays, male   DOB: December 26, 1975, 42 y.o.   MRN: [id number]  Pt currently presents with a masked affect and attention seeking behavior. Interaction with pt is superficial, becomes defensive when talking about discharge plans. States to nurse, "what... are you trying to keep me here." Pt conversations involve grandiose stories and pt seen commandeering staff to participate in magic tricks to get extra food and drink. Pt reports good sleep with current medication regimen.   Pt provided with medications per providers orders. Pt's labs and vitals were monitored throughout the night. Pt given a 1:1 about emotional and mental status. Pt supported and encouraged to express concerns and questions. Pt educated on medications.  Pt's safety ensured with 15 minute and environmental checks. Pt currently denies SI/HI and A/V hallucinations. Pt verbally agrees to seek staff if SI/HI or A/VH occurs and to consult with staff before acting on any harmful thoughts. Reports he appreciated the doctor telling him he was intelligent and will try to remain in groups and the dayroom tomorrow. Will continue POC.

## 2017-06-25 NOTE — Plan of Care (Signed)
  Problem: Safety: Goal: Periods of time without injury will increase Outcome: Progressing   Problem: Medication: Goal: Compliance with prescribed medication regimen will improve Outcome: Progressing  DAR NOTE: Patient presents with irritable affect and mood during assessment.  Denies suicidal thoughts, auditory and visual hallucinations.  Rates depression at 6, hopelessness at 5, and anxiety at 7.  Maintained on routine safety checks.  Medications given as prescribed.  Support and encouragement offered as needed.  Attended group and participated.  Patient visible in milieu briefly this afternoon with minimal interaction.  Offered no complaint.

## 2017-06-25 NOTE — [facility] Suicide Risk Assessment (Signed)
[facility] INPATIENT:  Family/Significant Other Suicide Prevention Education  Suicide Prevention Education:  Education Completed; Jose Hays, brother, [phone], has been identified by the patient as the family member/significant other with whom the patient will be residing, and identified as the person(s) who will aid the patient in the event of a mental health crisis (suicidal ideations/suicide attempt).  With written consent from the patient, the family member/significant other has been provided the following suicide prevention education, prior to the and/or following the discharge of the patient.  The suicide prevention education provided includes the following:  Suicide risk factors  Suicide prevention and interventions  National Suicide Hotline telephone number  [facility] assessment telephone number  [facility] Emergency Assistance 911  [facility] and/or Residential Mobile Crisis Unit telephone number  Request made of family/significant other to:  Remove weapons (e.g., guns, rifles, knives), all items previously/currently identified as safety concern.  Jose Hays does not think his brother has access to guns.  Remove drugs/medications (over-the-counter, prescriptions, illicit drugs), all items previously/currently identified as a safety concern.  The family member/significant other verbalizes understanding of the suicide prevention education information provided.  The family member/significant other agrees to remove the items of safety concern listed above.  Jose Hays lives in [place] and said extended family is in [place] as well.  Jose Hays said things really fell apart for pt when he injured his arm and could no longer play the drums.  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  Jose Hays talks to [place] when he can "when his phone is on."  He said there would be more support for family in [place], but pt left [place] because he didn't like it there.  He also said pt doesn't  "want to do good for himself" sometimes.  Some motivation issues.  Jose Frederick, LCSW 06/25/2017, 12:04 PM

## 2017-06-25 NOTE — [facility] Group Notes (Signed)
[facility] Group Notes:  (Nursing/MHT/Case Management/Adjunct)  Date:  06/25/2017  Time:  5:00 PM  Type of Therapy:  Psychoeducational Skills  Participation Level:  Active  Participation Quality:  Appropriate and Attentive  Affect:  Appropriate  Cognitive:  Alert and Appropriate  Insight:  Appropriate  Engagement in Group:  Engaged, Improving and Supportive  Modes of Intervention:  Discussion and Education  Summary of Progress/Problems: Discussed crisis management.  Patient states he is here to get his sanity back.  Patient was attentive and receptive.  Audrie Lia Angela Vazguez 06/25/2017, 5:00 PM

## 2017-06-25 NOTE — Progress Notes (Signed)
[facility] MD Progress Note  06/25/2017 11:08 AM Jose Hays  MRN:  [id number] Subjective: Patient is seen and examined.  Patient is a 42 year old male with past psychiatric history significant for depression and suicidal ideation as well as posttraumatic stress disorder.  He seen in follow-up.  Nursing reported this morning that he was a bit more irritable and yesterday.  We discussed that today.  There was some concern that the Wellbutrin had been leading to this.  He had been on Wellbutrin previously, but not very long and had not had irritability prior to that.  We discussed the possibility of increasing his gabapentin for anxiety as well as pain reasons.  We discussed coping skills, getting out of the room today, and participating in more activities on the unit.  He continues to have a decreased mood, and fleeting suicidal ideation. Principal Problem: MDD (major depressive disorder) Diagnosis:   Patient Active Problem List   Diagnosis Date Noted  . MDD (major depressive disorder) [F32.9] 06/20/2017  . Small bowel obstruction (HCC) [K56.609] 06/19/2017  . MDD (major depressive disorder), recurrent episode, severe (HCC) [F33.2] 06/18/2017  . Morbid obesity (HCC) [E66.01] 06/18/2017  . SBO (small bowel obstruction) (HCC) [K56.609] 06/18/2017  . Suicidal ideation [R45.851] 06/18/2017  . Homeless [Z59.0] 06/18/2017  . Cocaine use disorder, mild, abuse (HCC) [F14.10] 08/19/2016  . PTSD (post-traumatic stress disorder) [F43.10] 08/19/2016  . Right forearm injury [S59.911A] 08/19/2016  . Major depressive disorder, recurrent severe without psychotic features (HCC) [F33.2] 09/08/2014  . Cocaine abuse with cocaine-induced mood disorder [facility]) [F14.14] 09/08/2014   Total Time spent with patient: 20 minutes  Past Psychiatric History: See admission H&P  Past Medical History:  Past Medical History:  Diagnosis Date  . Coronary artery disease   . Hypertension    History reviewed. No pertinent surgical  history. Family History:  Family History  Problem Relation Age of Onset  . Alcohol abuse Father    Family Psychiatric  History: See admission H&P Social History:  Social History   Substance and Sexual Activity  Alcohol Use No     Social History   Substance and Sexual Activity  Drug Use Yes  . Types: Cocaine, Marijuana    Social History   Socioeconomic History  . Marital status: Single    Spouse name: Not on file  . Number of children: Not on file  . Years of education: Not on file  . Highest education level: Not on file  Occupational History  . Not on file  Social Needs  . Financial resource strain: Not on file  . Food insecurity:    Worry: Not on file    Inability: Not on file  . Transportation needs:    Medical: Not on file    Non-medical: Not on file  Tobacco Use  . Smoking status: Current Every Day Smoker    Packs/day: 0.25  . Smokeless tobacco: Never Used  Substance and Sexual Activity  . Alcohol use: No  . Drug use: Yes    Types: Cocaine, Marijuana  . Sexual activity: Not on file  Lifestyle  . Physical activity:    Days per week: Not on file    Minutes per session: Not on file  . Stress: Not on file  Relationships  . Social connections:    Talks on phone: Not on file    Gets together: Not on file    Attends religious service: Not on file    Active member of club or organization: Not on file  Attends meetings of clubs or organizations: Not on file    Relationship status: Not on file  Other Topics Concern  . Not on file  Social History Narrative  . Not on file   Additional Social History:                         Sleep: Good  Appetite:  Fair  Current Medications: Current Facility-Administered Medications  Medication Dose Route Frequency Provider Last Rate Last Dose  . acetaminophen (TYLENOL) tablet 650 mg  650 mg Oral Q6H PRN Oneta Rack, NP      . alum & mag hydroxide-simeth (MAALOX/MYLANTA) 200-200-20 MG/5ML suspension 30  mL  30 mL Oral Q4H PRN Oneta Rack, NP      . buPROPion (WELLBUTRIN XL) 24 hr tablet 300 mg  300 mg Oral Daily Oneta Rack, NP   300 mg at 06/25/17 0806  . gabapentin (NEURONTIN) capsule 300 mg  300 mg Oral TID Antonieta Pert, MD      . hydrOXYzine (ATARAX/VISTARIL) tablet 25 mg  25 mg Oral TID PRN Oneta Rack, NP   25 mg at 06/23/17 2151  . ibuprofen (ADVIL,MOTRIN) tablet 400 mg  400 mg Oral TID PRN Cobos, Rockey Situ, MD   400 mg at 06/21/17 1723  . magnesium hydroxide (MILK OF MAGNESIA) suspension 30 mL  30 mL Oral Daily PRN Oneta Rack, NP      . nicotine (NICODERM CQ - dosed in mg/24 hours) patch 21 mg  21 mg Transdermal Daily Cobos, Rockey Situ, MD   21 mg at 06/23/17 1040  . ondansetron (ZOFRAN) tablet 4 mg  4 mg Oral Q8H PRN Oneta Rack, NP      . prazosin (MINIPRESS) capsule 1 mg  1 mg Oral QHS Donell Sievert E, PA-C   1 mg at 06/24/17 2211  . sertraline (ZOLOFT) tablet 50 mg  50 mg Oral Daily Oneta Rack, NP   50 mg at 06/25/17 0806  . traZODone (DESYREL) tablet 100 mg  100 mg Oral QHS,MR X 1 Kerry Hough, PA-C   100 mg at 06/24/17 2211    Lab Results: No results found for this or any previous visit (from the past 48 hour(s)).  Blood Alcohol level:  Lab Results  Component Value Date   ETH <10 06/17/2017   ETH <5 11/10/2016    Metabolic Disorder Labs: Lab Results  Component Value Date   HGBA1C 5.5 08/20/2016   MPG 111 08/20/2016   No results found for: PROLACTIN Lab Results  Component Value Date   CHOL 204 (H) 08/20/2016   TRIG 80 08/20/2016   HDL 41 08/20/2016   CHOLHDL 5.0 08/20/2016   VLDL 16 08/20/2016   LDLCALC 147 (H) 08/20/2016    Physical Findings: AIMS: Facial and Oral Movements Muscles of Facial Expression: None, normal Lips and Perioral Area: None, normal Jaw: None, normal Tongue: None, normal,Extremity Movements Upper (arms, wrists, hands, fingers): None, normal Lower (legs, knees, ankles, toes): None, normal, Trunk  Movements Neck, shoulders, hips: None, normal, Overall Severity Severity of abnormal movements (highest score from questions above): None, normal Incapacitation due to abnormal movements: None, normal Patient's awareness of abnormal movements (rate only patient's report): No Awareness, Dental Status Current problems with teeth and/or dentures?: No Does patient usually wear dentures?: No  CIWA:    COWS:     Musculoskeletal: Strength & Muscle Tone: within normal limits Gait & Station: normal Patient  leans: N/A  Psychiatric Specialty Exam: Physical Exam  Nursing note and vitals reviewed. Constitutional: He is oriented to person, place, and time. He appears well-developed and well-nourished.  HENT:  Head: Normocephalic and atraumatic.  Respiratory: Effort normal.  Neurological: He is alert and oriented to person, place, and time.    ROS  Blood pressure 110/70, pulse 100, temperature 98.6 F (37 C), temperature source Oral, resp. rate 16, height  (1.905 m), weight (!) 158.8 kg (350 lb), SpO2 (!) 84 %.Body mass index is 43.75 kg/m.  General Appearance: Disheveled  Eye Contact:  Fair  Speech:  Normal Rate  Volume:  Decreased  Mood:  Depressed  Affect:  Congruent  Thought Process:  Coherent  Orientation:  Full (Time, Place, and Person)  Thought Content:  Logical  Suicidal Thoughts:  Yes.  without intent/plan  Homicidal Thoughts:  No  Memory:  Immediate;   Fair  Judgement:  Intact  Insight:  Fair  Psychomotor Activity:  Decreased  Concentration:  Concentration: Fair  Recall:  [employer] of Knowledge:  Fair  Language:  Good  Akathisia:  No  Handed:  Right  AIMS (if indicated):     Assets:  Desire for Improvement Physical Health Resilience  ADL's:  Intact  Cognition:  WNL  Sleep:  Number of Hours: 6.75     Treatment Plan Summary: Daily contact with patient to assess and evaluate symptoms and progress in treatment, Medication management and Plan Patient is seen  and examined.  Patient is a 42 year old male with the above-stated past psychiatric history is seen in follow-up.  He had a bit more increased irritability this morning.  I am an increase his gabapentin for some mood stability reasons as well as chronic pain issues.  His sertraline as well as Wellbutrin will remain at their same dosages.  I have encouraged him to leave the room, and get out of his room to participate more activities.  I told him I thought that sitting in the dark or laying in the dark would not benefit his depression in any shape or form.  He agreed to give it a shot.  Otherwise no change in his current treatment plan at this point.  Antonieta Pert, MD 06/25/2017, 11:08 AM

## 2017-06-25 NOTE — [facility] Group Notes (Signed)
[facility] LCSW Group Therapy Note  Date/Time: 06/25/17, 1315  Type of Therapy/Topic:  Group Therapy:  Balance in Life  Participation Level:  Active (and then appeared to be asleep once or twice)  Description of Group:    This group will address the concept of balance and how it feels and looks when one is unbalanced. Patients will be encouraged to process areas in their lives that are out of balance, and identify reasons for remaining unbalanced. Facilitators will guide patients utilizing problem- solving interventions to address and correct the stressor making their life unbalanced. Understanding and applying boundaries will be explored and addressed for obtaining  and maintaining a balanced life. Patients will be encouraged to explore ways to assertively make their unbalanced needs known to significant others in their lives, using other group members and facilitator for support and feedback.  Therapeutic Goals: 1. Patient will identify two or more emotions or situations they have that consume much of in their lives. 2. Patient will identify signs/triggers that life has become out of balance:  3. Patient will identify two ways to set boundaries in order to achieve balance in their lives:  4. Patient will demonstrate ability to communicate their needs through discussion and/or role plays  Summary of Patient Progress:Pt shared that mental/emotional, intellectual, creative, and family are areas that are out of balance in his life.  Pt participated in group discussion regarding ways to recognize and address areas of life that get out of balance. Pt shared with the group about being unable to play the drums after his accident and the group encouraged him to possibly pick it up again, even though he could only play with his one arm.           Therapeutic Modalities:   Cognitive Behavioral Therapy Solution-Focused Therapy Assertiveness Training  Daleen Squibb, [place]

## 2017-06-25 NOTE — Progress Notes (Signed)
Patient ID: Jose Hays, male   DOB: 10/17/1975, 42 y.o.   MRN: [id number] DAR Note: Pt observed in the dayroom playing cards with peers. Pt at assessment continue to endorse moderate anxiety, depression and right arm pain; "I am still trying my best to live one day at a time." Pt verbally contracts for safety. Pt denied HI, SI or AVH; "I don't feel like hurt myself. I just have to keep trying." Medications offered as prescribed. All patient's questions and concerns addressed. Support, encouragement, and safe environment provided. Will continue to monitor for any changes. 15-minute safety checks continue. Pt attended wrap-up group. Pt was med compliant.

## 2017-06-26 NOTE — [facility] Group Notes (Signed)
[facility] LCSW Group Therapy Note  Date/Time: 06/26/17, 1315  Type of Therapy/Topic:  Group Therapy:  Feelings about Diagnosis  Participation Level:  Minimal   Mood: pleasant   Description of Group:    This group will allow patients to explore their thoughts and feelings about diagnoses they have received. Patients will be guided to explore their level of understanding and acceptance of these diagnoses. Facilitator will encourage patients to process their thoughts and feelings about the reactions of others to their diagnosis, and will guide patients in identifying ways to discuss their diagnosis with significant others in their lives. This group will be process-oriented, with patients participating in exploration of their own experiences as well as giving and receiving support and challenge from other group members.   Therapeutic Goals: 1. Patient will demonstrate understanding of diagnosis as evidence by identifying two or more symptoms of the disorder:  2. Patient will be able to express two feelings regarding the diagnosis 3. Patient will demonstrate ability to communicate their needs through discussion and/or role plays  Summary of Patient Progress:Weylyn continues to lay back during group and appear to be sleeping but he is awake and does respond when addressed by CSW.  He listened only during group but does appear to be tracking along with the discussion despite his appearance.        Therapeutic Modalities:   Cognitive Behavioral Therapy Brief Therapy Feelings Identification   Daleen Squibb, LCSW

## 2017-06-26 NOTE — Progress Notes (Signed)
D: Patient presents calm, cooperative, euthymic. Patient stated his mood is "good," but continues to report feeling depressed and anxious on "patient self-inventory." Anxiety rated 6/10, depression 5/10 and hopelessness 5/10. Patient reports poor sleep, despite taking trazodone last night. Appetite is fair, energy is low, and concentration is poor. Patient denies withdrawal Sx. Continuing to have chronic pain in right arm 5/10, constant and aching. Declines medication intervention. Patient denies suicidal thoughts today, but reports they occur "sometimes".  A: Patient checked q15 min, and checks reviewed. Reviewed medications with patient and educated on side effects. Educated patient on importance of attending group therapy sessions and educated on several coping skills. Encouarged participation in milieu through recreation therapy and attending meals with peers. Support and encouragement provided. R: Patient receptive to education on medications, and is medication compliant. Patient attending all group therapy, recreation time, and cafeteria with peers. Patient lacks healthy coping skills. Patient set no goal for the day. Patient contracts for safety on the unit.

## 2017-06-26 NOTE — Plan of Care (Signed)
Patient continuing to report feelings of depression and anxiety 5 and 6/10, respectively. Patient lacks coping skills, with minimal participation in plan of care. Does report improved mood.

## 2017-06-26 NOTE — Progress Notes (Signed)
[facility] MD Progress Note  06/26/2017 10:42 AM Jose Hays  MRN:  [id number] Subjective: Patient is seen and examined.  Patient is a 42 year old male with a past psychiatric history significant for depression, suicidal ideation, posttraumatic stress disorder.  He seen in follow-up.  He continues to slowly improved.  He is less irritable than yesterday, and able to smile and engage.  He denied any side effects to his current medications.  We discussed discharge planning.  He denied current suicidal ideation.  We discussed coping skills training, and becoming more active in his own treatment. Principal Problem: MDD (major depressive disorder) Diagnosis:   Patient Active Problem List   Diagnosis Date Noted  . MDD (major depressive disorder) [F32.9] 06/20/2017  . Small bowel obstruction (HCC) [K56.609] 06/19/2017  . MDD (major depressive disorder), recurrent episode, severe (HCC) [F33.2] 06/18/2017  . Morbid obesity (HCC) [E66.01] 06/18/2017  . SBO (small bowel obstruction) (HCC) [K56.609] 06/18/2017  . Suicidal ideation [R45.851] 06/18/2017  . Homeless [Z59.0] 06/18/2017  . Cocaine use disorder, mild, abuse (HCC) [F14.10] 08/19/2016  . PTSD (post-traumatic stress disorder) [F43.10] 08/19/2016  . Right forearm injury [S59.911A] 08/19/2016  . Major depressive disorder, recurrent severe without psychotic features (HCC) [F33.2] 09/08/2014  . Cocaine abuse with cocaine-induced mood disorder [facility]) [F14.14] 09/08/2014   Total Time spent with patient: 20 minutes  Past Psychiatric History: See admission H&P  Past Medical History:  Past Medical History:  Diagnosis Date  . Coronary artery disease   . Hypertension    History reviewed. No pertinent surgical history. Family History:  Family History  Problem Relation Age of Onset  . Alcohol abuse Father    Family Psychiatric  History: See admission H&P Social History:  Social History   Substance and Sexual Activity  Alcohol Use No     Social  History   Substance and Sexual Activity  Drug Use Yes  . Types: Cocaine, Marijuana    Social History   Socioeconomic History  . Marital status: Single    Spouse name: Not on file  . Number of children: Not on file  . Years of education: Not on file  . Highest education level: Not on file  Occupational History  . Not on file  Social Needs  . Financial resource strain: Not on file  . Food insecurity:    Worry: Not on file    Inability: Not on file  . Transportation needs:    Medical: Not on file    Non-medical: Not on file  Tobacco Use  . Smoking status: Current Every Day Smoker    Packs/day: 0.25  . Smokeless tobacco: Never Used  Substance and Sexual Activity  . Alcohol use: No  . Drug use: Yes    Types: Cocaine, Marijuana  . Sexual activity: Not on file  Lifestyle  . Physical activity:    Days per week: Not on file    Minutes per session: Not on file  . Stress: Not on file  Relationships  . Social connections:    Talks on phone: Not on file    Gets together: Not on file    Attends religious service: Not on file    Active member of club or organization: Not on file    Attends meetings of clubs or organizations: Not on file    Relationship status: Not on file  Other Topics Concern  . Not on file  Social History Narrative  . Not on file   Additional Social History:  Sleep: Fair  Appetite:  Good  Current Medications: Current Facility-Administered Medications  Medication Dose Route Frequency Provider Last Rate Last Dose  . acetaminophen (TYLENOL) tablet 650 mg  650 mg Oral Q6H PRN Oneta Rack, NP      . alum & mag hydroxide-simeth (MAALOX/MYLANTA) 200-200-20 MG/5ML suspension 30 mL  30 mL Oral Q4H PRN Oneta Rack, NP      . buPROPion (WELLBUTRIN XL) 24 hr tablet 300 mg  300 mg Oral Daily Oneta Rack, NP   300 mg at 06/26/17 0808  . gabapentin (NEURONTIN) capsule 300 mg  300 mg Oral TID Antonieta Pert, MD   300  mg at 06/26/17 4098  . hydrOXYzine (ATARAX/VISTARIL) tablet 25 mg  25 mg Oral TID PRN Oneta Rack, NP   25 mg at 06/25/17 2232  . ibuprofen (ADVIL,MOTRIN) tablet 400 mg  400 mg Oral TID PRN Cobos, Rockey Situ, MD   400 mg at 06/21/17 1723  . magnesium hydroxide (MILK OF MAGNESIA) suspension 30 mL  30 mL Oral Daily PRN Oneta Rack, NP      . nicotine (NICODERM CQ - dosed in mg/24 hours) patch 21 mg  21 mg Transdermal Daily Cobos, Rockey Situ, MD   21 mg at 06/23/17 1040  . ondansetron (ZOFRAN) tablet 4 mg  4 mg Oral Q8H PRN Oneta Rack, NP      . prazosin (MINIPRESS) capsule 1 mg  1 mg Oral QHS Donell Sievert E, PA-C   1 mg at 06/25/17 2231  . sertraline (ZOLOFT) tablet 50 mg  50 mg Oral Daily Oneta Rack, NP   50 mg at 06/26/17 0808  . traZODone (DESYREL) tablet 100 mg  100 mg Oral QHS,MR X 1 Kerry Hough, PA-C   100 mg at 06/25/17 2232    Lab Results: No results found for this or any previous visit (from the past 48 hour(s)).  Blood Alcohol level:  Lab Results  Component Value Date   ETH <10 06/17/2017   ETH <5 11/10/2016    Metabolic Disorder Labs: Lab Results  Component Value Date   HGBA1C 5.5 08/20/2016   MPG 111 08/20/2016   No results found for: PROLACTIN Lab Results  Component Value Date   CHOL 204 (H) 08/20/2016   TRIG 80 08/20/2016   HDL 41 08/20/2016   CHOLHDL 5.0 08/20/2016   VLDL 16 08/20/2016   LDLCALC 147 (H) 08/20/2016    Physical Findings: AIMS: Facial and Oral Movements Muscles of Facial Expression: None, normal Lips and Perioral Area: None, normal Jaw: None, normal Tongue: None, normal,Extremity Movements Upper (arms, wrists, hands, fingers): None, normal Lower (legs, knees, ankles, toes): None, normal, Trunk Movements Neck, shoulders, hips: None, normal, Overall Severity Severity of abnormal movements (highest score from questions above): None, normal Incapacitation due to abnormal movements: None, normal Patient's awareness of  abnormal movements (rate only patient's report): No Awareness, Dental Status Current problems with teeth and/or dentures?: No Does patient usually wear dentures?: No  CIWA:  CIWA-Ar Total: 0 COWS:  COWS Total Score: 0  Musculoskeletal: Strength & Muscle Tone: within normal limits Gait & Station: normal Patient leans: N/A  Psychiatric Specialty Exam: Physical Exam  Constitutional: He is oriented to person, place, and time. He appears well-developed and well-nourished.  HENT:  Head: Normocephalic and atraumatic.  Respiratory: Effort normal.  Musculoskeletal: Normal range of motion.  Neurological: He is alert and oriented to person, place, and time.    ROS  Blood  pressure 122/77, pulse 75, temperature 98.6 F (37 C), temperature source Oral, resp. rate 16, height  (1.905 m), weight (!) 158.8 kg (350 lb), SpO2 (!) 84 %.Body mass index is 43.75 kg/m.  General Appearance: Casual  Eye Contact:  Fair  Speech:  Normal Rate  Volume:  Decreased  Mood:  Dysphoric  Affect:  Appropriate  Thought Process:  Coherent  Orientation:  Full (Time, Place, and Person)  Thought Content:  Logical  Suicidal Thoughts:  No  Homicidal Thoughts:  No  Memory:  Immediate;   Fair  Judgement:  Intact  Insight:  Fair  Psychomotor Activity:  Normal  Concentration:  Concentration: Fair  Recall:  Fair  Fund of Knowledge:  Good  Language:  Good  Akathisia:  No  Handed:  Right  AIMS (if indicated):     Assets:  Communication Skills Desire for Improvement Resilience  ADL's:  Intact  Cognition:  WNL  Sleep:  Number of Hours: 6     Treatment Plan Summary: Daily contact with patient to assess and evaluate symptoms and progress in treatment, Medication management and Plan Patient is seen and examined.  Patient is a 42 year old male with the above-stated past psychiatric history who was seen in follow-up.  He continues to slowly improve.  He continues on Wellbutrin XL 300 mg p.o. daily as well as  prazosin 1 mg p.o. nightly and sertraline 50 mg p.o. daily.  We discussed discharge planning.  We will look at Sunday morning for discharge.  He also reflected that the Neurontin increase is helping his pain.  Hopefully tomorrow will go well, and will be able to discharge him home on Sunday.  Antonieta Pert, MD 06/26/2017, 10:42 AM

## 2017-06-26 NOTE — Tx Team (Signed)
Interdisciplinary Treatment and Diagnostic Plan Update  06/26/2017 Time of Session: 1051 [name] MRN: [id number]  Principal Diagnosis: MDD (major depressive disorder)  Secondary Diagnoses: Principal Problem:   MDD (major depressive disorder)   Current Medications:  Current Facility-Administered Medications  Medication Dose Route Frequency Provider Last Rate Last Dose  . acetaminophen (TYLENOL) tablet 650 mg  650 mg Oral Q6H PRN Oneta Rack, NP      . alum & mag hydroxide-simeth (MAALOX/MYLANTA) 200-200-20 MG/5ML suspension 30 mL  30 mL Oral Q4H PRN Oneta Rack, NP      . buPROPion (WELLBUTRIN XL) 24 hr tablet 300 mg  300 mg Oral Daily Oneta Rack, NP   300 mg at 06/26/17 0808  . gabapentin (NEURONTIN) capsule 300 mg  300 mg Oral TID Antonieta Pert, MD   300 mg at 06/26/17 4098  . hydrOXYzine (ATARAX/VISTARIL) tablet 25 mg  25 mg Oral TID PRN Oneta Rack, NP   25 mg at 06/25/17 2232  . ibuprofen (ADVIL,MOTRIN) tablet 400 mg  400 mg Oral TID PRN Cobos, Rockey Situ, MD   400 mg at 06/21/17 1723  . magnesium hydroxide (MILK OF MAGNESIA) suspension 30 mL  30 mL Oral Daily PRN Oneta Rack, NP      . nicotine (NICODERM CQ - dosed in mg/24 hours) patch 21 mg  21 mg Transdermal Daily Cobos, Rockey Situ, MD   21 mg at 06/23/17 1040  . ondansetron (ZOFRAN) tablet 4 mg  4 mg Oral Q8H PRN Oneta Rack, NP      . prazosin (MINIPRESS) capsule 1 mg  1 mg Oral QHS Donell Sievert E, PA-C   1 mg at 06/25/17 2231  . sertraline (ZOLOFT) tablet 50 mg  50 mg Oral Daily Oneta Rack, NP   50 mg at 06/26/17 0808  . traZODone (DESYREL) tablet 100 mg  100 mg Oral QHS,MR X 1 Kerry Hough, PA-C   100 mg at 06/25/17 2232   PTA Medications: Medications Prior to Admission  Medication Sig Dispense Refill Last Dose  . buPROPion (WELLBUTRIN XL) 150 MG 24 hr tablet Take 1 tablet (150 mg total) by mouth daily. 30 tablet 0   . hydrOXYzine (ATARAX/VISTARIL) 25 MG tablet Take 1 tablet (25  mg total) by mouth 3 (three) times daily as needed for anxiety. 20 tablet 0   . naproxen sodium (ALEVE) 220 MG tablet Take 220 mg by mouth 2 (two) times daily as needed (pain).   Past Week at Unknown time  . nicotine (NICODERM CQ - DOSED IN MG/24 HOURS) 21 mg/24hr patch Place 1 patch (21 mg total) onto the skin daily. 28 patch 0   . polyethylene glycol (MIRALAX / GLYCOLAX) packet Take 17 g by mouth daily. 14 each 0   . traZODone (DESYREL) 50 MG tablet Take 1 tablet (50 mg total) by mouth at bedtime as needed for sleep. 20 tablet 0     Patient Stressors: Marital or family conflict Medication change or noncompliance  Patient Strengths: Ability for insight Average or above average intelligence Capable of independent living General fund of knowledge  Treatment Modalities: Medication Management, Group therapy, Case management,  1 to 1 session with clinician, Psychoeducation, Recreational therapy.   Physician Treatment Plan for Primary Diagnosis: MDD (major depressive disorder) Long Term Goal(s): Improvement in symptoms so as ready for discharge Improvement in symptoms so as ready for discharge   Short Term Goals: Ability to identify changes in lifestyle to reduce recurrence  of condition will improve Ability to verbalize feelings will improve Compliance with prescribed medications will improve Ability to disclose and discuss suicidal ideas Ability to identify and develop effective coping behaviors will improve  Medication Management: Evaluate patient's response, side effects, and tolerance of medication regimen.  Therapeutic Interventions: 1 to 1 sessions, Unit Group sessions and Medication administration.  Evaluation of Outcomes: Progressing  Physician Treatment Plan for Secondary Diagnosis: Principal Problem:   MDD (major depressive disorder)  Long Term Goal(s): Improvement in symptoms so as ready for discharge Improvement in symptoms so as ready for discharge   Short Term Goals:  Ability to identify changes in lifestyle to reduce recurrence of condition will improve Ability to verbalize feelings will improve Compliance with prescribed medications will improve Ability to disclose and discuss suicidal ideas Ability to identify and develop effective coping behaviors will improve     Medication Management: Evaluate patient's response, side effects, and tolerance of medication regimen.  Therapeutic Interventions: 1 to 1 sessions, Unit Group sessions and Medication administration.  Evaluation of Outcomes: Progressing   RN Treatment Plan for Primary Diagnosis: MDD (major depressive disorder) Long Term Goal(s): Knowledge of disease and therapeutic regimen to maintain health will improve  Short Term Goals: Ability to identify and develop effective coping behaviors will improve and Compliance with prescribed medications will improve  Medication Management: RN will administer medications as ordered by provider, will assess and evaluate patient's response and provide education to patient for prescribed medication. RN will report any adverse and/or side effects to prescribing provider.  Therapeutic Interventions: 1 on 1 counseling sessions, Psychoeducation, Medication administration, Evaluate responses to treatment, Monitor vital signs and CBGs as ordered, Perform/monitor CIWA, COWS, AIMS and Fall Risk screenings as ordered, Perform wound care treatments as ordered.  Evaluation of Outcomes: Progressing   LCSW Treatment Plan for Primary Diagnosis: MDD (major depressive disorder) Long Term Goal(s): Safe transition to appropriate next level of care at discharge, Engage patient in therapeutic group addressing interpersonal concerns.  Short Term Goals: Engage patient in aftercare planning with referrals and resources, Increase social support and Increase skills for wellness and recovery  Therapeutic Interventions: Assess for all discharge needs, 1 to 1 time with Social worker,  Explore available resources and support systems, Assess for adequacy in community support network, Educate family and significant other(s) on suicide prevention, Complete Psychosocial Assessment, Interpersonal group therapy.  Evaluation of Outcomes: Progressing   Progress in Treatment: Attending groups: Yes. Participating in groups: Yes. Taking medication as prescribed: Yes. Toleration medication: Yes. Family/Significant other contact made: No, will contact:  when given permission Patient understands diagnosis: Yes. Discussing patient identified problems/goals with staff: Yes. Medical problems stabilized or resolved: Yes. Denies suicidal/homicidal ideation: Yes. Issues/concerns per patient self-inventory: No. Other: none  New problem(s) identified: No, Describe:  none  New Short Term/Long Term Goal(s):Pt goal: "want to feel better"  Discharge Plan or Barriers: Patient plans to return home and follow up with [facility] for medication management and therapy services.   Reason for Continuation of Hospitalization: Depression Medication stabilization  Estimated Length of Stay: 3-5 days.  Attendees: Patient: Jose Hays 06/22/2017   Physician: Dr Jama Flavors, MD; Dr. Landry Mellow, MD 06/22/2017   Nursing: Merian Capron, RN; Alexia Freestone.D, RN 06/22/2017   RN Care Manager:X 06/22/2017   Social Worker: Daleen Squibb, LCSW; Baldo Daub, LCSWA 06/22/2017   Recreational Therapist: Juliann Pares 06/22/2017   Other: X 06/22/2017   Other: X 06/22/2017   Other:X 06/22/2017        Scribe for Treatment  Team: Maeola Sarah, LCSWA 06/26/2017 8:37 AM

## 2017-06-26 NOTE — Progress Notes (Signed)
Recreation Therapy Notes  Date: 5.10.19 Time: 0930 Location: [address] Dayroom  Group Topic: Stress Management  Goal Area(s) Addresses:  Patient will verbalize importance of using healthy stress management.  Patient will identify positive emotions associated with healthy stress management.   Intervention: Stress Management  Activity : Body Scan Meditation.  LRT played meditation that allowed patients to take note of any sensations and feelings they may have been experiencing throughout their bodies.  Patients were to follow along as meditation played.  Education:  Stress Management, Discharge Planning.   Education Outcome: Acknowledges edcuation/In group clarification offered/Needs additional education  Clinical Observations/Feedback: Pt did not attend group.    Caroll Rancher, LRT/CTRS         Caroll Rancher A 06/26/2017 12:44 PM

## 2017-06-27 DIAGNOSIS — Z590 Homelessness: Secondary | ICD-10-CM

## 2017-06-27 DIAGNOSIS — F418 Other specified anxiety disorders: Secondary | ICD-10-CM

## 2017-06-27 DIAGNOSIS — G8929 Other chronic pain: Secondary | ICD-10-CM

## 2017-06-27 DIAGNOSIS — F515 Nightmare disorder: Secondary | ICD-10-CM

## 2017-06-27 DIAGNOSIS — Z659 Problem related to unspecified psychosocial circumstances: Secondary | ICD-10-CM

## 2017-06-27 DIAGNOSIS — M79601 Pain in right arm: Secondary | ICD-10-CM

## 2017-06-27 MED ORDER — GABAPENTIN 400 MG PO CAPS
400.0000 mg | ORAL_CAPSULE | Freq: Three times a day (TID) | ORAL | Status: DC
  Administered 2017-06-27 – 2017-06-29 (×6): 400 mg via ORAL
  Filled 2017-06-27 (×2): qty 21
  Filled 2017-06-27: qty 1
  Filled 2017-06-27: qty 4
  Filled 2017-06-27: qty 1
  Filled 2017-06-27: qty 4
  Filled 2017-06-27 (×9): qty 1
  Filled 2017-06-27: qty 21

## 2017-06-27 MED ORDER — TRAZODONE HCL 150 MG PO TABS
150.0000 mg | ORAL_TABLET | Freq: Every evening | ORAL | Status: DC | PRN
  Administered 2017-06-27 – 2017-06-28 (×2): 150 mg via ORAL
  Filled 2017-06-27 (×3): qty 1

## 2017-06-27 MED ORDER — SERTRALINE HCL 100 MG PO TABS
100.0000 mg | ORAL_TABLET | Freq: Every day | ORAL | Status: DC
  Administered 2017-06-28 – 2017-06-29 (×2): 100 mg via ORAL
  Filled 2017-06-27 (×2): qty 1
  Filled 2017-06-27: qty 7
  Filled 2017-06-27 (×3): qty 1

## 2017-06-27 NOTE — [facility] Group Notes (Signed)
Adult Psychoeducational Group Note  Date:  06/27/2017 Time:  10:28 PM  Group Topic/Focus:  Wrap-Up Group:   The focus of this group is to help patients review their daily goal of treatment and discuss progress on daily workbooks.  Participation Level:  Active  Participation Quality:  Appropriate and Attentive  Affect:  Appropriate  Cognitive:  Alert and Appropriate  Insight: Appropriate and Good  Engagement in Group:  Engaged  Modes of Intervention:  Discussion and Education  Additional Comments:  Pt attended and participated in wrap up group this evening. Pr had an okay day due to them being able to sleep, speaking with the Dr and possibly leaving on Wednesday. Pt completed their goal to be positive and not look into the past.   Chrisandra Netters 06/27/2017, 10:28 PM

## 2017-06-27 NOTE — Progress Notes (Addendum)
D. Pt presents with a flat affect and depressed behavior but brightens a little upon interaction. Pt observed sitting in dayroom interacting with peers this am. Per pt's self inventory, pt rates his depression, hopelessness and anxiety a 5/10, 6/10, 6/10, respectively. Pt currently denies SI/HI and AV hallucinations. A. Labs and vitals monitored. Pt compliant with medications. Pt supported emotionally and encouraged to express concerns and ask questions.   R. Pt remains safe with 15 minute checks. Will continue POC.

## 2017-06-27 NOTE — Plan of Care (Signed)
  Problem: Education: Goal: Emotional status will improve Outcome: Progressing Goal: Mental status will improve Outcome: Progressing   

## 2017-06-27 NOTE — Progress Notes (Signed)
[facility] MD Progress Note  06/27/2017 5:16 PM Leshaun Biebel  MRN:  [id number] Subjective: Patient reports partial improvement of depression compared to admission presentation but states he remains depressed, sad, and ruminates about psychosocial stressors, mainly homelessness. Describes some lingering neurovegetative symptoms such as decreased energy level, a subjective sense of anhedonia, fair sleep. Denies medication side effects. Currently denies suicidal ideations. Objective: I have reviewed the chart notes and have met with patient. 42 year old male admitted to the hospital due to worsening depression and suicidal ideations.  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  Has been diagnosed with MDD and PTSD. Currently patient remains depressed, with a constricted but somewhat more reactive affect.  Affect does seem to improve during session and is more reactive than on admission.  Denies suicidal ideations.  Patient is currently homeless, has been living in his vehicle before admission, and is ruminative about this chronic stressor, which she describes as a contributor to his depression.  We discussed options, he expresses interest in exploring the possibility of going to [facility] No disruptive behaviors on unit. Limited group/milieu interaction, cooperative on approach. Denies medication side effects.  Principal Problem: MDD (major depressive disorder) Diagnosis:   Patient Active Problem List   Diagnosis Date Noted  . MDD (major depressive disorder) [F32.9] 06/20/2017  . Small bowel obstruction ([place]) [K56.609] 06/19/2017  . MDD (major depressive disorder), recurrent episode, severe ([place]) [F33.2] 06/18/2017  . Morbid obesity ([place]) [E66.01] 06/18/2017  . SBO (small bowel obstruction) ([place]) [K56.609] 06/18/2017  . Suicidal ideation [R45.851] 06/18/2017  . Homeless [Z59.0] 06/18/2017  . Cocaine use disorder, mild, abuse ([place]) [F14.10] 08/19/2016   . PTSD (post-traumatic stress disorder) [F43.10] 08/19/2016  . Right forearm injury [S59.911A] 08/19/2016  . Major depressive disorder, recurrent severe without psychotic features ([place]) [F33.2] 09/08/2014  . Cocaine abuse with cocaine-induced mood disorder [facility]) [F14.14] 09/08/2014   Total Time spent with patient: 20 minutes  Past Psychiatric History: See admission H&P  Past Medical History:  Past Medical History:  Diagnosis Date  . Coronary artery disease   . Hypertension    History reviewed. No pertinent surgical history. Family History:  Family History  Problem Relation Age of Onset  . Alcohol abuse Father    Family Psychiatric  History: See admission H&P Social History:  Social History   Substance and Sexual Activity  Alcohol Use No     Social History   Substance and Sexual Activity  Drug Use Yes  . Types: Cocaine, Marijuana    Social History   Socioeconomic History  . Marital status: Single    Spouse name: Not on file  . Number of children: Not on file  . Years of education: Not on file  . Highest education level: Not on file  Occupational History  . Not on file  Social Needs  . Financial resource strain: Not on file  . Food insecurity:    Worry: Not on file    Inability: Not on file  . Transportation needs:    Medical: Not on file    Non-medical: Not on file  Tobacco Use  . Smoking status: Current Every Day Smoker    Packs/day: 0.25  . Smokeless tobacco: Never Used  Substance and Sexual Activity  . Alcohol use: No  . Drug use: Yes    Types: Cocaine, Marijuana  . Sexual activity: Not on file  Lifestyle  . Physical activity:    Days per week: Not on file  Minutes per session: Not on file  . Stress: Not on file  Relationships  . Social connections:    Talks on phone: Not on file    Gets together: Not on file    Attends religious service: Not on file    Active member of club or organization: Not on file    Attends meetings of clubs or  organizations: Not on file    Relationship status: Not on file  Other Topics Concern  . Not on file  Social History Narrative  . Not on file   Additional Social History:   Sleep: Fair  Appetite:  Good  Current Medications: Current Facility-Administered Medications  Medication Dose Route Frequency Provider Last Rate Last Dose  . acetaminophen (TYLENOL) tablet 650 mg  650 mg Oral Q6H PRN Derrill Center, NP      . alum & mag hydroxide-simeth (MAALOX/MYLANTA) 200-200-20 MG/5ML suspension 30 mL  30 mL Oral Q4H PRN Derrill Center, NP      . buPROPion (WELLBUTRIN XL) 24 hr tablet 300 mg  300 mg Oral Daily Derrill Center, NP   300 mg at 06/27/17 0800  . gabapentin (NEURONTIN) capsule 400 mg  400 mg Oral TID Cesia Orf A, MD      . hydrOXYzine (ATARAX/VISTARIL) tablet 25 mg  25 mg Oral TID PRN Derrill Center, NP   25 mg at 06/26/17 2126  . ibuprofen (ADVIL,MOTRIN) tablet 400 mg  400 mg Oral TID PRN Jaliah Foody, Myer Peer, MD   400 mg at 06/21/17 1723  . magnesium hydroxide (MILK OF MAGNESIA) suspension 30 mL  30 mL Oral Daily PRN Derrill Center, NP      . nicotine (NICODERM CQ - dosed in mg/24 hours) patch 21 mg  21 mg Transdermal Daily Vinayak Bobier, Myer Peer, MD   21 mg at 06/23/17 1040  . ondansetron (ZOFRAN) tablet 4 mg  4 mg Oral Q8H PRN Derrill Center, NP      . prazosin (MINIPRESS) capsule 1 mg  1 mg Oral QHS Patriciaann Clan E, PA-C   1 mg at 06/26/17 2126  . [START ON 06/28/2017] sertraline (ZOLOFT) tablet 100 mg  100 mg Oral Daily Timtohy Broski, Myer Peer, MD      . traZODone (DESYREL) tablet 150 mg  150 mg Oral QHS PRN Antwon Rochin, Myer Peer, MD        Lab Results: No results found for this or any previous visit (from the past 48 hour(s)).  Blood Alcohol level:  Lab Results  Component Value Date   ETH <10 06/17/2017   ETH <5 09/32/3557    Metabolic Disorder Labs: Lab Results  Component Value Date   HGBA1C 5.5 08/20/2016   MPG 111 08/20/2016   No results found for: PROLACTIN Lab  Results  Component Value Date   CHOL 204 (H) 08/20/2016   TRIG 80 08/20/2016   HDL 41 08/20/2016   CHOLHDL 5.0 08/20/2016   VLDL 16 08/20/2016   LDLCALC 147 (H) 08/20/2016    Physical Findings: AIMS: Facial and Oral Movements Muscles of Facial Expression: None, normal Lips and Perioral Area: None, normal Jaw: None, normal Tongue: None, normal,Extremity Movements Upper (arms, wrists, hands, fingers): None, normal Lower (legs, knees, ankles, toes): None, normal, Trunk Movements Neck, shoulders, hips: None, normal, Overall Severity Severity of abnormal movements (highest score from questions above): None, normal Incapacitation due to abnormal movements: None, normal Patient's awareness of abnormal movements (rate only patient's report): No Awareness, Dental Status Current problems with  teeth and/or dentures?: No Does patient usually wear dentures?: No  CIWA:  CIWA-Ar Total: 0 COWS:  COWS Total Score: 0  Musculoskeletal: Strength & Muscle Tone: within normal limits Gait & Station: normal Patient leans: N/A  Psychiatric Specialty Exam: Physical Exam  Constitutional: He is oriented to person, place, and time. He appears well-developed and well-nourished.  HENT:  Head: Normocephalic and atraumatic.  Respiratory: Effort normal.  Musculoskeletal: Normal range of motion.  Neurological: He is alert and oriented to person, place, and time.    ROS denies chest pain, no shortness of breath, reports chronic neuropathic type pain on right arm and hand.  Blood pressure 121/77, pulse 82, temperature 98.6 F (37 C), temperature source Oral, resp. rate 16, height 6' 3"  (1.905 m), weight (!) 158.8 kg (350 lb), SpO2 (!) 84 %.Body mass index is 43.75 kg/m.  General Appearance: Fairly Groomed  Eye Contact:  Improving  Speech:  Normal Rate  Volume:  Decreased  Mood:  Remains depressed  Affect:  Still constricted, less irritable, improves partially during session, smiles briefly at times   Thought Process:  Linear and Descriptions of Associations: Intact  Orientation:  Other:  Fully alert and attentive  Thought Content:  No hallucinations, no delusions, not internally preoccupied  Suicidal Thoughts:  No-currently denies suicidal ideations, contracts for safety on unit  Homicidal Thoughts:  No denies homicidal or violent ideations at present  Memory:  Recent and remote grossly intact  Judgement:  Other:  Improving  Insight:  Improving  Psychomotor Activity:  Decreased  Concentration:  Concentration: Good and Attention Span: Good  Recall:  Good  Fund of Knowledge:  Good  Language:  Good  Akathisia:  No  Handed:  Right  AIMS (if indicated):     Assets:  Communication Skills Desire for Improvement Resilience  ADL's:  Intact  Cognition:  WNL  Sleep:  Number of Hours: 6    Assessment -patient remains depressed, although does acknowledge partial improvement compared to his admission presentation.  He continues to present with a constricted vaguely irritable affect, but it is more reactive and does smile at times appropriately.  He is not suicidal.  He is endorsing ongoing neurovegetative symptoms of depression, mainly anhedonia and fair sleep.  Thus far tolerating medications well.  He is currently on Wellbutrin XL, Zoloft, and Minipress was recently added for PTSD associated nightmares.   Treatment Plan Summary: Treatment plan reviewed as below today May 11 Encourage group and milieu participation to work on Radiographer, therapeutic and symptom reduction Treatment team working on disposition planning, as noted patient expresses interest in [employer] if an option Increase his Zoloft to 100 mgrs QDAY for depression Increase Neurontin to 400 mgrs TID for chronic arm pain and for anxiety Continue Wellbutrin XL 300 mg QDAY for depression Continue Minipress 1 mgr QHS for PTSD associated nightmares  Increase Trazodone to 150 mgrs QHS PRN for insomnia    Jenne Campus,  MD 06/27/2017, 5:16 PM   Patient ID: Clementeen Graham, male   DOB: [birth year], 42 y.o.   MRN: [id number]

## 2017-06-27 NOTE — [facility] Group Notes (Signed)
[facility] Group Notes: (Clinical Social Work)   06/27/2017      Type of Therapy:  Group Therapy   Participation Level:  Did Not Attend despite MHT prompting   Brenen Beigel Grossman-Orr, LCSW 06/27/2017, 12:03 PM     

## 2017-06-27 NOTE — Progress Notes (Signed)
Patient ID: Jose Hays, male   DOB: 04/16/1975, 42 y.o.   MRN: [id number] DAR Note: Pt observed in the dayroom interacting with peer. Pt at assessment continue to endorse moderate anxiety, depression and right arm pain; "they say I may be leaving on Sunday; I don't think I'm ready to go." Pt denied HI, SI or AVH; Pt verbally contracts for safety. Medications offered as prescribed. All patient's questions and concerns addressed. Support, encouragement, and safe environment provided. Will continue to monitor for any changes. 15-minute safety checks continue. Pt attended wrap-up group. Pt was med compliant.

## 2017-06-28 ENCOUNTER — Encounter (HOSPITAL_COMMUNITY): Payer: Self-pay

## 2017-06-28 DIAGNOSIS — M79603 Pain in arm, unspecified: Secondary | ICD-10-CM

## 2017-06-28 MED ORDER — ASPIRIN EC 81 MG PO TBEC: 324.0000 mg | DELAYED_RELEASE_TABLET | ORAL | Status: DC

## 2017-06-28 MED ORDER — ASPIRIN 81 MG PO CHEW
CHEWABLE_TABLET | ORAL | Status: AC
  Administered 2017-06-28: 324 mg
  Filled 2017-06-28: qty 4

## 2017-06-28 MED ORDER — PRAZOSIN HCL 2 MG PO CAPS
2.0000 mg | ORAL_CAPSULE | Freq: Every day | ORAL | Status: DC
  Administered 2017-06-28: 2 mg via ORAL
  Filled 2017-06-28: qty 1
  Filled 2017-06-28: qty 7
  Filled 2017-06-28: qty 2
  Filled 2017-06-28: qty 1

## 2017-06-28 NOTE — ED Provider Notes (Signed)
[facility] EMERGENCY DEPARTMENT Provider Note   CSN: [id number] Arrival date & time: 06/28/17  2334     History   Chief Complaint Chief Complaint  Patient presents with  . Chest Pain    HPI Jose Hays is a 42 y.o. male.  Patient is a 42 year old male with past medical history of depression, PTSD, cocaine abuse, and recent admission for small bowel obstruction.  He was brought here this evening from behavioral health where he is being treated for suicidal ideation.  He says yesterday he developed a "squeezing" sensation in the center of his chest.  This began in the absence of any injury or trauma.  He denies any associated shortness of breath, diaphoresis, or radiation to his arm or jaw.  His pain is worse when he moves.  He denies any fevers, chills, or productive cough.  He denies any leg pain or swelling.  He has no prior cardiac history.  The history is provided by the patient.  Chest Pain   This is a new problem. The current episode started yesterday. The problem occurs constantly. The problem has been gradually worsening. The pain is present in the substernal region. The pain is moderate. Quality: Squeezing. The pain does not radiate. Pertinent negatives include no cough, no diaphoresis, no palpitations, no shortness of breath and no sputum production. He has tried nothing for the symptoms.    Past Medical History:  Diagnosis Date  . Coronary artery disease   . Hypertension     Patient Active Problem List   Diagnosis Date Noted  . MDD (major depressive disorder) 06/20/2017  . Small bowel obstruction (H[facility]) 06/19/2017  . MDD (major depressive disorder), recurrent episode, severe (HCC) 06/18/2017  . Morbid obesity (H[facility]) 06/18/2017  . SBO (small bowel obstruction) (H[facility]) 06/18/2017  . Suicidal ideation 06/18/2017  . Homeless 06/18/2017  . Cocaine use disorder, mild, abuse (H[facility]) 08/19/2016  . PTSD (post-traumatic stress disorder) 08/19/2016  . Right  forearm injury 08/19/2016  . Major depressive disorder, recurrent severe without psychotic features (HCC) 09/08/2014  . Cocaine abuse with cocaine-induced mood disorder (HCC) 09/08/2014    History reviewed. No pertinent surgical history.      Home Medications    Prior to Admission medications   Medication Sig Start Date End Date Taking? Authorizing Provider  buPROPion (WELLBUTRIN XL) 150 MG 24 hr tablet Take 1 tablet (150 mg total) by mouth daily. 06/21/17   Rodolph Bong, MD  hydrOXYzine (ATARAX/VISTARIL) 25 MG tablet Take 1 tablet (25 mg total) by mouth 3 (three) times daily as needed for anxiety. 06/20/17   Rodolph Bong, MD  naproxen sodium (ALEVE) 220 MG tablet Take 220 mg by mouth 2 (two) times daily as needed (pain).    [provider]  nicotine (NICODERM CQ - DOSED IN MG/24 HOURS) 21 mg/24hr patch Place 1 patch (21 mg total) onto the skin daily. 06/20/17   Rodolph Bong, MD  polyethylene glycol [facility] / Ethelene Hal) packet Take 17 g by mouth daily. 06/20/17   Rodolph Bong, MD  traZODone (DESYREL) 50 MG tablet Take 1 tablet (50 mg total) by mouth at bedtime as needed for sleep. 06/20/17   Rodolph Bong, MD    Family History Family History  Problem Relation Age of Onset  . Alcohol abuse Father     Social History Social History   Tobacco Use  . Smoking status: Current Every Day Smoker    Packs/day: 0.25  . Smokeless tobacco: Never  Used  Substance Use Topics  . Alcohol use: No  . Drug use: Yes    Types: Cocaine, Marijuana     Allergies   Patient has no known allergies.   Review of Systems Review of Systems  Constitutional: Negative for diaphoresis.  Respiratory: Negative for cough, sputum production and shortness of breath.   Cardiovascular: Positive for chest pain. Negative for palpitations.  All other systems reviewed and are negative.    Physical Exam Updated Vital Signs BP 117/82 (BP Location: Left Arm)   Pulse 68   Temp 98.9  F (37.2 C) (Oral)   Resp 19   Ht  (1.981 m)   Wt (!) 156.5 kg (345 lb)   SpO2 95%   BMI 39.87 kg/m   Physical Exam  Constitutional: He is oriented to person, place, and time. He appears well-developed and well-nourished. No distress.  HENT:  Head: Normocephalic and atraumatic.  Mouth/Throat: Oropharynx is clear and moist.  Neck: Normal range of motion. Neck supple.  Cardiovascular: Normal rate and regular rhythm. Exam reveals no friction rub.  No murmur heard. There is tenderness to palpation of the anterior chest wall.  This seems to reproduce his symptoms.  Pulmonary/Chest: Effort normal and breath sounds normal. No accessory muscle usage. No tachypnea. No respiratory distress. He has no wheezes. He has no rales.  Abdominal: Soft. Bowel sounds are normal. He exhibits no distension. There is no tenderness.  Musculoskeletal: Normal range of motion. He exhibits no edema.  Neurological: He is alert and oriented to person, place, and time. Coordination normal.  Skin: Skin is warm and dry. He is not diaphoretic.  Nursing note and vitals reviewed.    ED Treatments / Results  Labs (all labs ordered are listed, but only abnormal results are displayed) Labs Reviewed  TSH  BASIC METABOLIC PANEL  CBC  I-STAT TROPONIN, ED    EKG EKG Interpretation  Date/Time:  Sunday Jun 28 2017 23:45:50 EDT Ventricular Rate:  68 PR Interval:    QRS Duration: 106 QT Interval:  387 QTC Calculation: 412 R Axis:   44 Text Interpretation:  Sinus rhythm Normal ECG No significant change from 08/19/2016 Confirmed by Geoffery Lyons ([id number]) on 06/28/2017 11:49:28 PM   Radiology No results found.  Procedures Procedures (including critical care time)  Medications Ordered in ED Medications  acetaminophen (TYLENOL) tablet 650 mg (has no administration in time range)  alum & mag hydroxide-simeth (MAALOX/MYLANTA) 200-200-20 MG/5ML suspension 30 mL (has no administration in time range)  magnesium  hydroxide (MILK OF MAGNESIA) suspension 30 mL (has no administration in time range)  hydrOXYzine (ATARAX/VISTARIL) tablet 25 mg (25 mg Oral Given 06/27/17 2124)  ondansetron (ZOFRAN) tablet 4 mg (has no administration in time range)  buPROPion (WELLBUTRIN XL) 24 hr tablet 300 mg (300 mg Oral Given 06/28/17 0812)  nicotine (NICODERM CQ - dosed in mg/24 hours) patch 21 mg (21 mg Transdermal Not Given 06/28/17 0814)  ibuprofen (ADVIL,MOTRIN) tablet 400 mg (400 mg Oral Given 06/21/17 1723)  sertraline (ZOLOFT) tablet 100 mg (100 mg Oral Given 06/28/17 0812)  gabapentin (NEURONTIN) capsule 400 mg (400 mg Oral Given 06/28/17 1849)  traZODone (DESYREL) tablet 150 mg (150 mg Oral Given 06/28/17 2213)  prazosin (MINIPRESS) capsule 2 mg (2 mg Oral Given 06/28/17 2211)  aspirin EC tablet 324 mg (324 mg Oral Not Given 06/28/17 2330)  traZODone (DESYREL) 50 MG tablet (  Duplicate 06/20/17 2224)  ibuprofen (ADVIL,MOTRIN) 400 MG tablet (  Duplicate 06/21/17 1819)  aspirin  81 MG chewable tablet (324 mg  Given 06/28/17 2306)     Initial Impression / Assessment and Plan / ED Course  I have reviewed the triage vital signs and the nursing notes.  Pertinent labs & imaging results that were available during my care of the patient were reviewed by me and considered in my medical decision making (see chart for details).  Patient presenting with chest discomfort that began at behavioral health where he is currently an inpatient.  His pain seems atypical for cardiac pain and has been ongoing for over 24 hours.  His work-up reveals an unchanged EKG and negative troponin.  He had a mildly positive d-dimer, however negative CT scan.  I suspect a musculoskeletal etiology and doubt a cardiac cause.  I feel as though he is appropriate for return to behavioral health.  I will advise ibuprofen, rest, and follow-up as needed.  Final Clinical Impressions(s) / ED Diagnoses   Final diagnoses:  None    ED Discharge Orders    None         Geoffery Lyons, MD 06/29/17 [phone]

## 2017-06-28 NOTE — Progress Notes (Signed)
Pt started complaining of chest pain, epigastric area.  BP was 117/82 and pulse 81.  He reported that it was squeezing chest pain.  Notified Donell Sievert PA.  New orders noted for aspirin and O2 2L per minute.

## 2017-06-28 NOTE — Progress Notes (Signed)
Called EMS for transport.  Notified [facility] charge nurse about Dave's condition.

## 2017-06-28 NOTE — Progress Notes (Signed)
Pt left the unit with EMS.  

## 2017-06-28 NOTE — Progress Notes (Addendum)
[facility] MD Progress Note  06/28/2017 1:24 PM Jose Hays  MRN:  [id number]   Subjective: Carver seen sitting in dayroom interacting with peers. ( playing cards) Patient reports his mood has improved slightly. Patient present guarded and flat, however is more reactive than on admission. Patient continues to reports symptoms of worries. Patient expressed concerns with following up with [facility]. NP to follow-up with social worker for additional housing resources.  [name] continues to denies suicidal or homicidal ideations. Reports he continues to have a difficult time staying asleep and continues to have nightmares.Discussed sleeping hygiene to aid to symptoms.ie taking medication 30 mins to 1 hour early.  Chart was reviewed noted  recent medication adjustment was made 06/27/2017.  Discussed increasing minipress 1 mg to 2 for nightmares. Patient to f/u with provider after a few nights with medication adjustments. Patient reports he is taking and tolerating medication well, without medication side effects. Reports he has a fair appetite. Labs reviewed tsh wnl Support, encouragement and reassurance was provided.  History: noted per HPI-Sani Ambroseis an 42 y.o.malewho presents voluntarilyreporting primary symptoms of increasing depression, SI with a plan to borrow a friend's gun and shoot himself.Pt acknowledges symptoms including social withdrawal, loss of interest in usual pleasures, decreased concentration, fatigue, irritability, decreased sleep, decreased appetite and feelings of hopelessness.  Pt endorses SI,history ofSA, but no current use. Pt denieshistory of violence  Principal Problem: MDD (major depressive disorder) Diagnosis:   Patient Active Problem List   Diagnosis Date Noted  . MDD (major depressive disorder) [F32.9] 06/20/2017  . Small bowel obstruction (HCC) [K56.609] 06/19/2017  . MDD (major depressive disorder), recurrent episode, severe (HCC) [F33.2] 06/18/2017  .  Morbid obesity (HCC) [E66.01] 06/18/2017  . SBO (small bowel obstruction) (HCC) [K56.609] 06/18/2017  . Suicidal ideation [R45.851] 06/18/2017  . Homeless [Z59.0] 06/18/2017  . Cocaine use disorder, mild, abuse (HCC) [F14.10] 08/19/2016  . PTSD (post-traumatic stress disorder) [F43.10] 08/19/2016  . Right forearm injury [S59.911A] 08/19/2016  . Major depressive disorder, recurrent severe without psychotic features (HCC) [F33.2] 09/08/2014  . Cocaine abuse with cocaine-induced mood disorder [facility]) [F14.14] 09/08/2014   Total Time spent with patient: 20 minutes  Past Psychiatric History: See admission H&P  Past Medical History:  Past Medical History:  Diagnosis Date  . Coronary artery disease   . Hypertension    History reviewed. No pertinent surgical history. Family History:  Family History  Problem Relation Age of Onset  . Alcohol abuse Father    Family Psychiatric  History: See admission H&P Social History:  Social History   Substance and Sexual Activity  Alcohol Use No     Social History   Substance and Sexual Activity  Drug Use Yes  . Types: Cocaine, Marijuana    Social History   Socioeconomic History  . Marital status: Single    Spouse name: Not on file  . Number of children: Not on file  . Years of education: Not on file  . Highest education level: Not on file  Occupational History  . Not on file  Social Needs  . Financial resource strain: Not on file  . Food insecurity:    Worry: Not on file    Inability: Not on file  . Transportation needs:    Medical: Not on file    Non-medical: Not on file  Tobacco Use  . Smoking status: Current Every Day Smoker    Packs/day: 0.25  . Smokeless tobacco: Never Used  Substance and Sexual Activity  .  Alcohol use: No  . Drug use: Yes    Types: Cocaine, Marijuana  . Sexual activity: Not on file  Lifestyle  . Physical activity:    Days per week: Not on file    Minutes per session: Not on file  . Stress: Not on  file  Relationships  . Social connections:    Talks on phone: Not on file    Gets together: Not on file    Attends religious service: Not on file    Active member of club or organization: Not on file    Attends meetings of clubs or organizations: Not on file    Relationship status: Not on file  Other Topics Concern  . Not on file  Social History Narrative  . Not on file   Additional Social History:   Sleep: Fair  Appetite:  Good  Current Medications: Current Facility-Administered Medications  Medication Dose Route Frequency Provider Last Rate Last Dose  . acetaminophen (TYLENOL) tablet 650 mg  650 mg Oral Q6H PRN Oneta Rack, NP      . alum & mag hydroxide-simeth (MAALOX/MYLANTA) 200-200-20 MG/5ML suspension 30 mL  30 mL Oral Q4H PRN Oneta Rack, NP      . buPROPion (WELLBUTRIN XL) 24 hr tablet 300 mg  300 mg Oral Daily Oneta Rack, NP   300 mg at 06/28/17 0812  . gabapentin (NEURONTIN) capsule 400 mg  400 mg Oral TID Nam Vossler, Rockey Situ, MD   400 mg at 06/28/17 1610  . hydrOXYzine (ATARAX/VISTARIL) tablet 25 mg  25 mg Oral TID PRN Oneta Rack, NP   25 mg at 06/27/17 2124  . ibuprofen (ADVIL,MOTRIN) tablet 400 mg  400 mg Oral TID PRN Tallula Grindle, Rockey Situ, MD   400 mg at 06/21/17 1723  . magnesium hydroxide (MILK OF MAGNESIA) suspension 30 mL  30 mL Oral Daily PRN Oneta Rack, NP      . nicotine (NICODERM CQ - dosed in mg/24 hours) patch 21 mg  21 mg Transdermal Daily Obinna Ehresman, Rockey Situ, MD   21 mg at 06/23/17 1040  . ondansetron (ZOFRAN) tablet 4 mg  4 mg Oral Q8H PRN Oneta Rack, NP      . prazosin (MINIPRESS) capsule 1 mg  1 mg Oral QHS Donell Sievert E, PA-C   1 mg at 06/27/17 2124  . sertraline (ZOLOFT) tablet 100 mg  100 mg Oral Daily Johanny Segers, Rockey Situ, MD   100 mg at 06/28/17 0812  . traZODone (DESYREL) tablet 150 mg  150 mg Oral QHS PRN Brazen Domangue, Rockey Situ, MD   150 mg at 06/27/17 2124    Lab Results: No results found for this or any previous visit (from  the past 48 hour(s)).  Blood Alcohol level:  Lab Results  Component Value Date   ETH <10 06/17/2017   ETH <5 11/10/2016    Metabolic Disorder Labs: Lab Results  Component Value Date   HGBA1C 5.5 08/20/2016   MPG 111 08/20/2016   No results found for: PROLACTIN Lab Results  Component Value Date   CHOL 204 (H) 08/20/2016   TRIG 80 08/20/2016   HDL 41 08/20/2016   CHOLHDL 5.0 08/20/2016   VLDL 16 08/20/2016   LDLCALC 147 (H) 08/20/2016    Physical Findings: AIMS: Facial and Oral Movements Muscles of Facial Expression: None, normal Lips and Perioral Area: None, normal Jaw: None, normal Tongue: None, normal,Extremity Movements Upper (arms, wrists, hands, fingers): None, normal Lower (legs, knees, ankles, toes):  None, normal, Trunk Movements Neck, shoulders, hips: None, normal, Overall Severity Severity of abnormal movements (highest score from questions above): None, normal Incapacitation due to abnormal movements: None, normal Patient's awareness of abnormal movements (rate only patient's report): No Awareness, Dental Status Current problems with teeth and/or dentures?: No Does patient usually wear dentures?: No  CIWA:  CIWA-Ar Total: 0 COWS:  COWS Total Score: 0  Musculoskeletal: Strength & Muscle Tone: within normal limits Gait & Station: normal Patient leans: N/A  Psychiatric Specialty Exam: Physical Exam  Constitutional: He is oriented to person, place, and time. He appears well-developed and well-nourished.  HENT:  Head: Normocephalic and atraumatic.  Respiratory: Effort normal.  Musculoskeletal: Normal range of motion.  Neurological: He is alert and oriented to person, place, and time.  Psychiatric: He has a normal mood and affect. His behavior is normal.    Review of Systems  Psychiatric/Behavioral: Positive for depression. The patient is nervous/anxious and has insomnia.   All other systems reviewed and are negative.  denies chest pain, no shortness of  breath, reports chronic neuropathic type pain on right arm and hand.  Blood pressure 103/75, pulse 87, temperature 97.7 F (36.5 C), resp. rate 16, height  (1.905 m), weight (!) 158.8 kg (350 lb), SpO2 (!) 84 %.Body mass index is 43.75 kg/m.  General Appearance: Fairly Groomed  Eye Contact:  Improving  Speech:  Normal Rate  Volume:  Decreased  Mood:  Remains depressed  Affect:  Congruent and Depressed  Thought Process:  Linear  Orientation:  Full (Time, Place, and Person)  Thought Content:  Hallucinations: None  Suicidal Thoughts:  No  Homicidal Thoughts:  No present  Memory:  Recent and remote grossly intact  Judgement:  Other:  Improving  Insight:  Improving  Psychomotor Activity:  Decreased  Concentration:  Concentration: Good and Attention Span: Good  Recall:  Good  Fund of Knowledge:  Good  Language:  Good  Akathisia:  No  Handed:  Right  AIMS (if indicated):     Assets:  Communication Skills Desire for Improvement Resilience  ADL's:  Intact  Cognition:  WNL  Sleep:  Number of Hours: 6.75   Treatment Plan Summary: Daily contact with patient to assess and evaluate symptoms and progress in treatment and Medication management  Continue with current treatment plan on 06/28/2017 except where noted  continue Zoloft to 100 mgrs QDAY for depression continue  Neurontin to 400 mgrs TID for chronic arm pain and for anxiety Continue Wellbutrin XL 300 mg QDAY for depression  Increased  Minipress 1 mg to 2 mg  QHS for PTSD associated nightmares  Continue  Trazodone to 150 mgrs QHS PRN for insomnia    Will continue to monitor vitals ,medication compliance and treatment side effects while patient is here.   CSW will continue working on disposition, patient to consider [employer] .  Patient to participate in therapeutic milieu      Oneta Rack, NP 06/28/2017, 1:24 PM  ..Agree with NP Progress Note

## 2017-06-28 NOTE — Progress Notes (Signed)
Patient ID: Journee Kohen, male   DOB: 23-Oct-1975, 42 y.o.   MRN: [id number] DAR Note: Pt was a little livelier than normal; "the doctor said I would be leaving on Wednesday and not this Sunday." Pt observed in the dayroom interacting with peers. Pt at assessment continue to endorse moderate anxiety, depression and right arm pain; "The pain is not as bad as it was." Pt denied HI, SI or AVH; Pt verbally contracts for safety. Medications offered as prescribed. All patient's questions and concerns addressed. Support, encouragement, and safe environment provided. Will continue to monitor for any changes. 15-minute safety checks continue. Pt attended wrap-up group. Pt was med compliant.

## 2017-06-28 NOTE — Significant Event (Cosign Needed)
Notified by NS, Jose Hays is declaring CP in the epigastrium area, denies radiation of pain, pain rated 8/10,  Pain is endorsed as sharp. Patient hemodynamically stable with V/S 117/81, P 81, denies SOB, diaphoresis, nausea nor change in mentation. No reproducible chest wall tenderness with palpation, patient with hx of tobacco use, hypertension, hyperlipidemia and obesity. Positive family Hx of CAD/AMI. O2 via [place] given at 2l /min, x 4 baby asa given, will send to [facility] ED for ACS evaluation

## 2017-06-28 NOTE — [facility] Group Notes (Signed)
[facility] Group Notes: (Clinical Social Work)   06/28/2017      Type of Therapy:  Group Therapy   Participation Level:  Did Not Attend despite MHT prompting - He was in the room when group was announced, and made a point of leaving the room because he does not want to be in a group.   Youngren Mantle, LCSW 06/28/2017, 12:57 PM

## 2017-06-28 NOTE — ED Triage Notes (Signed)
Pt from [facility] due to central chest pain that started after taking night meds. Pt states this happened to him last night and it went away on it's own. Pt was given 324 asa prior to ems arrival. Pt pain was brought from 8/10 to 3/10. Pt axo x4 and does not state the pain went to the back or was diaphoretic.

## 2017-06-28 NOTE — ED Notes (Signed)
Sitter at bedside.

## 2017-06-28 NOTE — Progress Notes (Addendum)
D. Pt presents with an appropriate affect and improving mood- observed in the dayroom interacting well with peers. Patient provided heat packs this am for back pain (5/10) - reports complete relief from applied heat. Pt reports having slept poorly last night and endorses low energy and poor concentration. Per pt's self inventory, pt rates his depression, hopelessness and anxiety a 5/4/6, respectively. Pt currently denies SI/HI and AV hallucinations. A. Labs and vitals monitored. Pt compliant with medications. Pt supported emotionally and encouraged to express concerns and ask questions.   R. Pt remains safe with 15 minute checks. Will continue POC.

## 2017-06-29 ENCOUNTER — Inpatient Hospital Stay (HOSPITAL_COMMUNITY): Payer: No Typology Code available for payment source

## 2017-06-29 LAB — BASIC METABOLIC PANEL
ANION GAP: 7 (ref 5–15)
BUN: 11 mg/dL (ref 6–20)
CHLORIDE: 101 mmol/L (ref 101–111)
CO2: 29 mmol/L (ref 22–32)
Calcium: 9 mg/dL (ref 8.9–10.3)
Creatinine, Ser: 1.22 mg/dL (ref 0.61–1.24)
GFR calc Af Amer: >60 mL/min (ref >60)
GLUCOSE: 108 mg/dL — AB (ref 65–99)
POTASSIUM: 4.5 mmol/L (ref 3.5–5.1)
Sodium: 137 mmol/L (ref 135–145)

## 2017-06-29 LAB — CBC
HEMATOCRIT: 40.9 % (ref 39.0–52.0)
HEMOGLOBIN: 12.8 g/dL — AB (ref 13.0–17.0)
MCH: 26 pg (ref 26.0–34.0)
MCHC: 31.3 g/dL (ref 30.0–36.0)
MCV: 83 fL (ref 78.0–100.0)
Platelets: 167 10*3/uL (ref 150–400)
RBC: 4.93 MIL/uL (ref 4.22–5.81)
RDW: 16 % — ABNORMAL HIGH (ref 11.5–15.5)
WBC: 7.3 10*3/uL (ref 4.0–10.5)

## 2017-06-29 LAB — D-DIMER, QUANTITATIVE (NOT AT ARMC): D DIMER QUANT: 0.52 ug{FEU}/mL — AB (ref 0.00–0.50)

## 2017-06-29 LAB — I-STAT TROPONIN, ED: Troponin i, poc: 0 ng/mL (ref 0.00–0.08)

## 2017-06-29 MED ORDER — PRAZOSIN HCL 2 MG PO CAPS: 2.0000 mg | ORAL_CAPSULE | Freq: Every day | ORAL | 0 refills | Status: AC

## 2017-06-29 MED ORDER — GABAPENTIN 400 MG PO CAPS: 400.0000 mg | ORAL_CAPSULE | Freq: Three times a day (TID) | ORAL | 0 refills | Status: AC

## 2017-06-29 MED ORDER — BUPROPION HCL ER (XL) 300 MG PO TB24: 300.0000 mg | ORAL_TABLET | Freq: Every day | ORAL | 0 refills | Status: AC

## 2017-06-29 MED ORDER — SERTRALINE HCL 100 MG PO TABS: 100.0000 mg | ORAL_TABLET | Freq: Every day | ORAL | 0 refills | Status: AC

## 2017-06-29 MED ORDER — IOPAMIDOL (ISOVUE-370) INJECTION 76%
100.0000 mL | Freq: Once | INTRAVENOUS | Status: AC | PRN
  Administered 2017-06-29: 100 mL via INTRAVENOUS

## 2017-06-29 MED ORDER — TRAZODONE HCL 150 MG PO TABS: 150.0000 mg | ORAL_TABLET | Freq: Every evening | ORAL | 0 refills | Status: AC | PRN

## 2017-06-29 MED ORDER — KETOROLAC TROMETHAMINE 30 MG/ML IJ SOLN
30.0000 mg | Freq: Once | INTRAMUSCULAR | Status: AC
  Administered 2017-06-29: 30 mg via INTRAVENOUS
  Filled 2017-06-29: qty 1

## 2017-06-29 NOTE — [facility] Suicide Risk Assessment (Signed)
[facility] Discharge Suicide Risk Assessment   Principal Problem: MDD (major depressive disorder) Discharge Diagnoses:  Patient Active Problem List   Diagnosis Date Noted  . MDD (major depressive disorder) [F32.9] 06/20/2017  . Small bowel obstruction (HCC) [K56.609] 06/19/2017  . MDD (major depressive disorder), recurrent episode, severe (HCC) [F33.2] 06/18/2017  . Morbid obesity (HCC) [E66.01] 06/18/2017  . SBO (small bowel obstruction) (HCC) [K56.609] 06/18/2017  . Suicidal ideation [R45.851] 06/18/2017  . Homeless [Z59.0] 06/18/2017  . Cocaine use disorder, mild, abuse (HCC) [F14.10] 08/19/2016  . PTSD (post-traumatic stress disorder) [F43.10] 08/19/2016  . Right forearm injury [S59.911A] 08/19/2016  . Major depressive disorder, recurrent severe without psychotic features (HCC) [F33.2] 09/08/2014  . Cocaine abuse with cocaine-induced mood disorder [facility]) [F14.14] 09/08/2014    Total Time spent with patient: 30 minutes  Musculoskeletal: Strength & Muscle Tone: within normal limits Gait & Station: normal Patient leans: N/A  Psychiatric Specialty Exam: Review of Systems  All other systems reviewed and are negative.   Blood pressure (!) 94/55, pulse 96, temperature (!) 97.5 F (36.4 C), temperature source Oral, resp. rate 18, height  (1.981 m), weight (!) 156.5 kg (345 lb), SpO2 96 %.Body mass index is 39.87 kg/m.  General Appearance: Casual  Eye Contact::  Fair  Speech:  Slow409  Volume:  Decreased  Mood:  Euthymic  Affect:  Appropriate  Thought Process:  Coherent  Orientation:  Full (Time, Place, and Person)  Thought Content:  Logical  Suicidal Thoughts:  No  Homicidal Thoughts:  No  Memory:  Immediate;   Fair  Judgement:  Intact  Insight:  Fair  Psychomotor Activity:  Decreased  Concentration:  Fair  Recall:  [employer] of Knowledge:Fair  Language: Fair  Akathisia:  Negative  Handed:  Right  AIMS (if indicated):     Assets:  Desire for Improvement Physical  Health Resilience  Sleep:  Number of Hours: 6.75  Cognition: WNL  ADL's:  Intact   Mental Status Per Nursing Assessment::   On Admission:     Demographic Factors:  Low socioeconomic status and Unemployed  Loss Factors: Financial problems/change in socioeconomic status  Historical Factors: Impulsivity  Risk Reduction Factors:   Positive coping skills or problem solving skills  Continued Clinical Symptoms:  Depression:   Anhedonia  Cognitive Features That Contribute To Risk:  None    Suicide Risk:  Minimal: No identifiable suicidal ideation.  Patients presenting with no risk factors but with morbid ruminations; may be classified as minimal risk based on the severity of the depressive symptoms  Follow-up Information    [facility]. Go on 07/01/2017.   Why:  Please attend your appt on Wednesday, 07/01/17, at 8:30am. Contact information: [address] [place] [address] [phone]        [facility] EMERGENCY DEPARTMENT.   Specialty:  Emergency Medicine Why:  As needed, If symptoms worsen Contact information: [address] [id number] mc [place] [address] [phone]          Plan Of Care/Follow-up recommendations:  Activity:  ad lib  Antonieta Pert, MD 06/29/2017, 8:29 AM

## 2017-06-29 NOTE — Progress Notes (Signed)
  [facility] Adult Case Management Discharge Plan :  Will you be returning to the same living situation after discharge: Yes,per the patient, he  plans to discharge home from the hospital and follow up with [facility] on 07/01/17 after his [facility] appointment.  At discharge, do you have transportation home?: Yes, patient reports a friend will pick him up at discharge. Do you have the ability to pay for your medications: No.  Release of information consent forms completed and in the chart;  Patient's signature needed at discharge.  Patient to Follow up at: Follow-up Information    [facility]. Go on 07/01/2017.   Why:  Please attend your appt on Wednesday, 07/01/17, at 8:30am. Contact information: [address] [place] [address] [phone]        [facility] Follow up.   Specialty:  Behavioral Health Contact information: [address] [place] [address] [phone]          Next level of care provider has access to [facility] Link:yes  Safety Planning and Suicide Prevention discussed: Yes,  with the patient's child's mother  Have you used any form of tobacco in the last 30 days? (Cigarettes, Smokeless Tobacco, Cigars, and/or Pipes): Yes  Has patient been referred to the Quitline?: Patient refused referral  Patient has been referred for addiction treatment: N/A  Maeola Sarah, LCSWA 06/29/2017, 9:38 AM
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
## 2017-06-29 NOTE — Discharge Instructions (Addendum)
Ibuprofen 600 mg every 6 hours as needed for pain. ° °Return to the emergency department if symptoms significantly worsen or change. °

## 2017-06-29 NOTE — ED Notes (Signed)
Patient transported to CT 

## 2017-06-29 NOTE — Progress Notes (Signed)
Patient discharged to lobby. Patient was stable and appreciative at that time. All papers, samples and prescriptions were given and valuables returned. Verbal understanding expressed. Denies SI/HI and A/VH. Patient given opportunity to express concerns and ask questions.  

## 2017-06-29 NOTE — [facility] Group Notes (Signed)
[facility] Group Notes:  (Nursing/MHT/Case Management/Adjunct)  Date:  06/28/17 Time:  8:20p  Type of Therapy:  Wrap up group  Participation Level:  Active  Participation Quality:  Appropriate, Attentive and Sharing  Affect:  Appropriate  Cognitive:  Appropriate  Insight:  Good  Engagement in Group:  Engaged  Modes of Intervention:  Discussion and Support  Summary of Progress/Problems:  Jose Hays reported that his day was 6/10 (10 the best).  He reported his goal was to stay out of bed.  "I have been out of the bed more today than I have been."  Levin Bacon 06/29/2017, 4:23 AM

## 2017-06-29 NOTE — Progress Notes (Signed)
Psychoeducational Group Note  Date:  06/29/2017 Time:  1412  Group Topic/Focus:  Dimensions of Wellness:   The focus of this group is to introduce the topic of wellness and discuss the role each dimension of wellness plays in total health.  Participation Level: Did Not Attend  Participation Quality:  Not Applicable  Affect:  Not Applicable  Cognitive:  Not Applicable  Insight:  Not Applicable  Engagement in Group: Not Applicable  Additional Comments:  Pt said he did not get any sleep last night and needed to sleep. Pt stated he will try to make the next group.  Hayzen Lorenson E 06/29/2017, 2:13 PM

## 2017-06-29 NOTE — Progress Notes (Signed)
Pt back on the unit from the ED-Non Specific Chest Pain. Pt states, "They told me they couldn't find anything and they were going to discharge me." Pt ambulated unassisted onto the unit accompanied by MHT.  Pt denied any chest pain on arrival; "I think tomorrow will be a better day for me."

## 2017-06-29 NOTE — ED Notes (Signed)
Pt went back to behavior health at this time.

## 2019-11-10 IMAGING — CR DG CHEST 2V
2 series · 2 of 2 positions shown · non-contrast
Comparison: Chest radiograph June 19, 2017

CLINICAL DATA: Mid chest pain tonight.

EXAM:
CHEST - 2 VIEW

[chest lat]
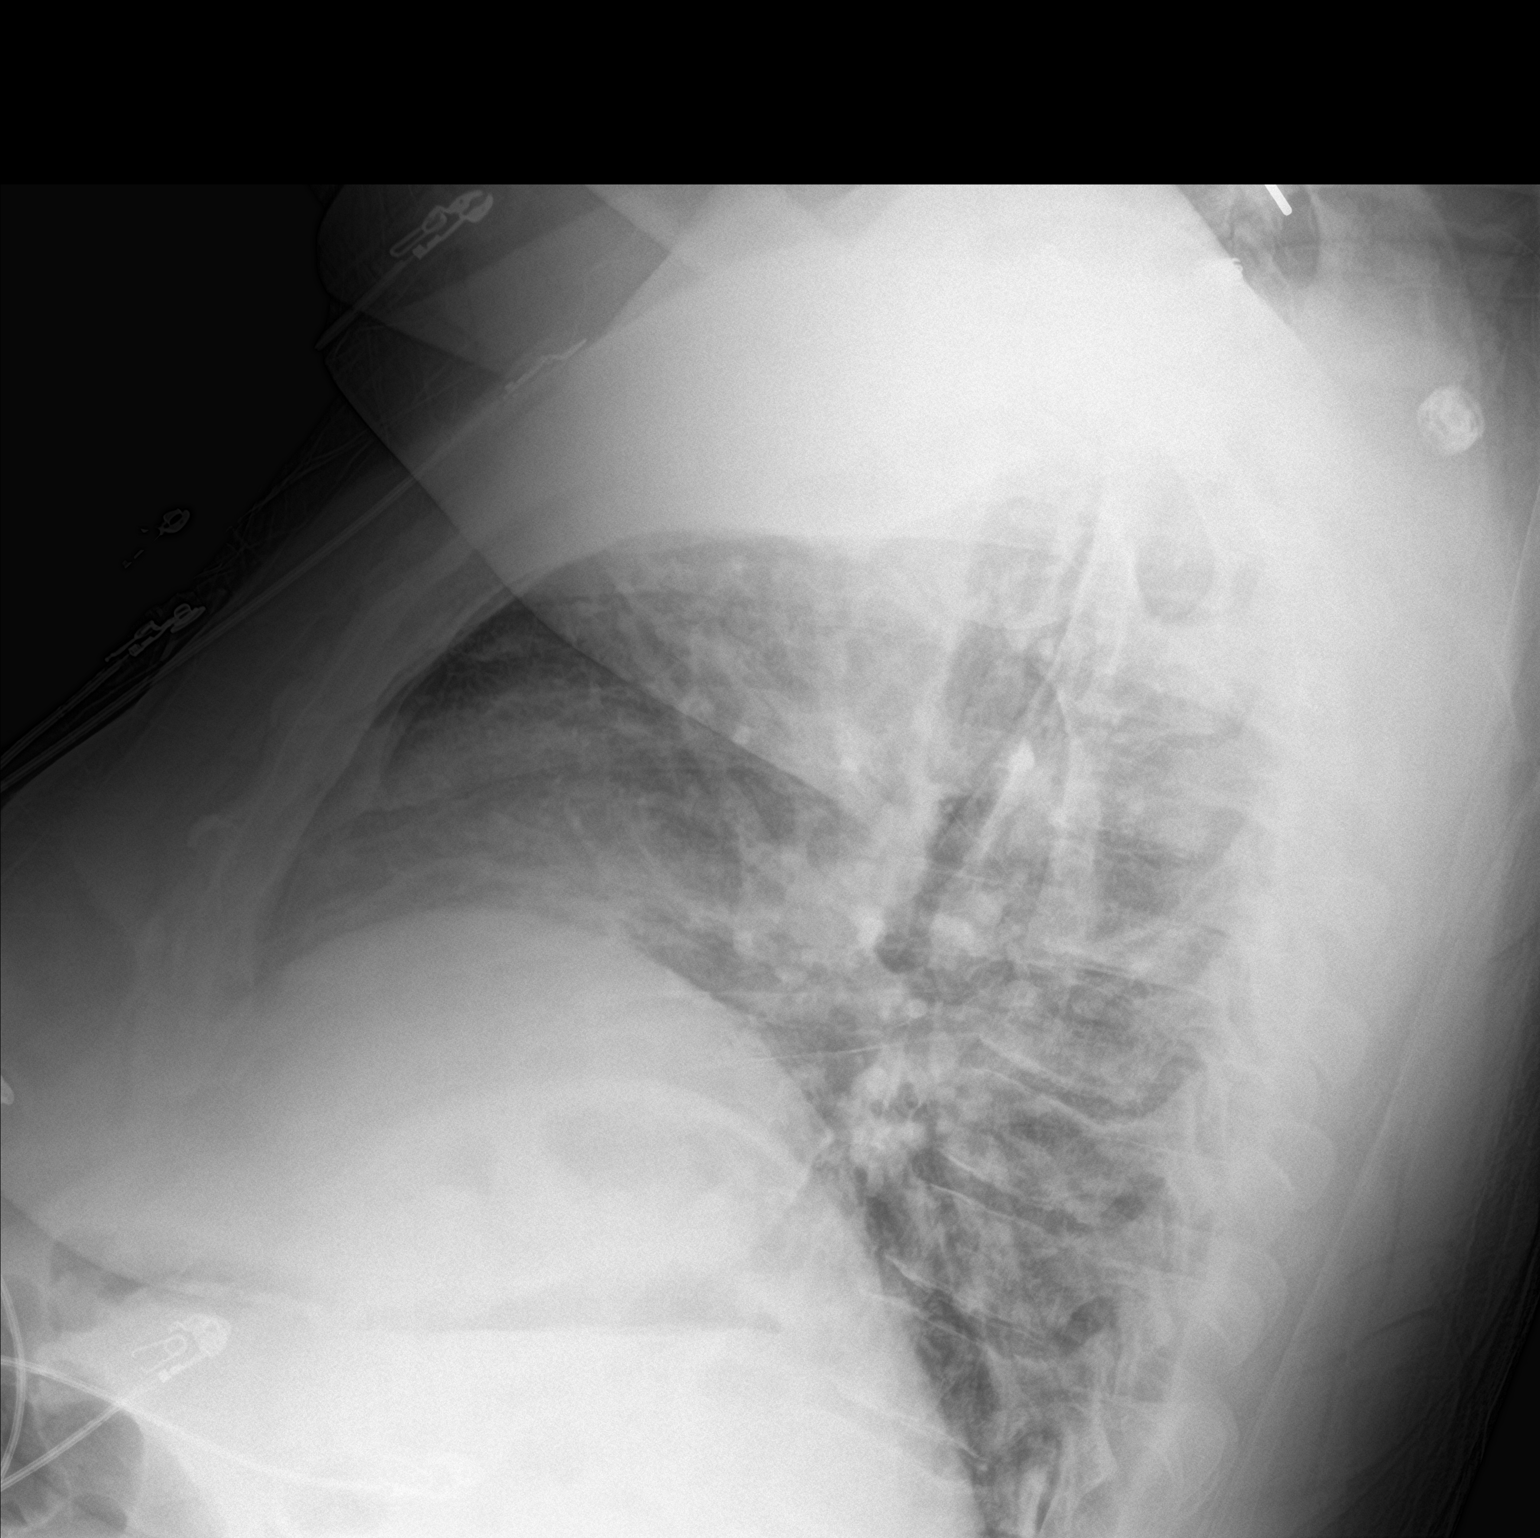

[chest ap]
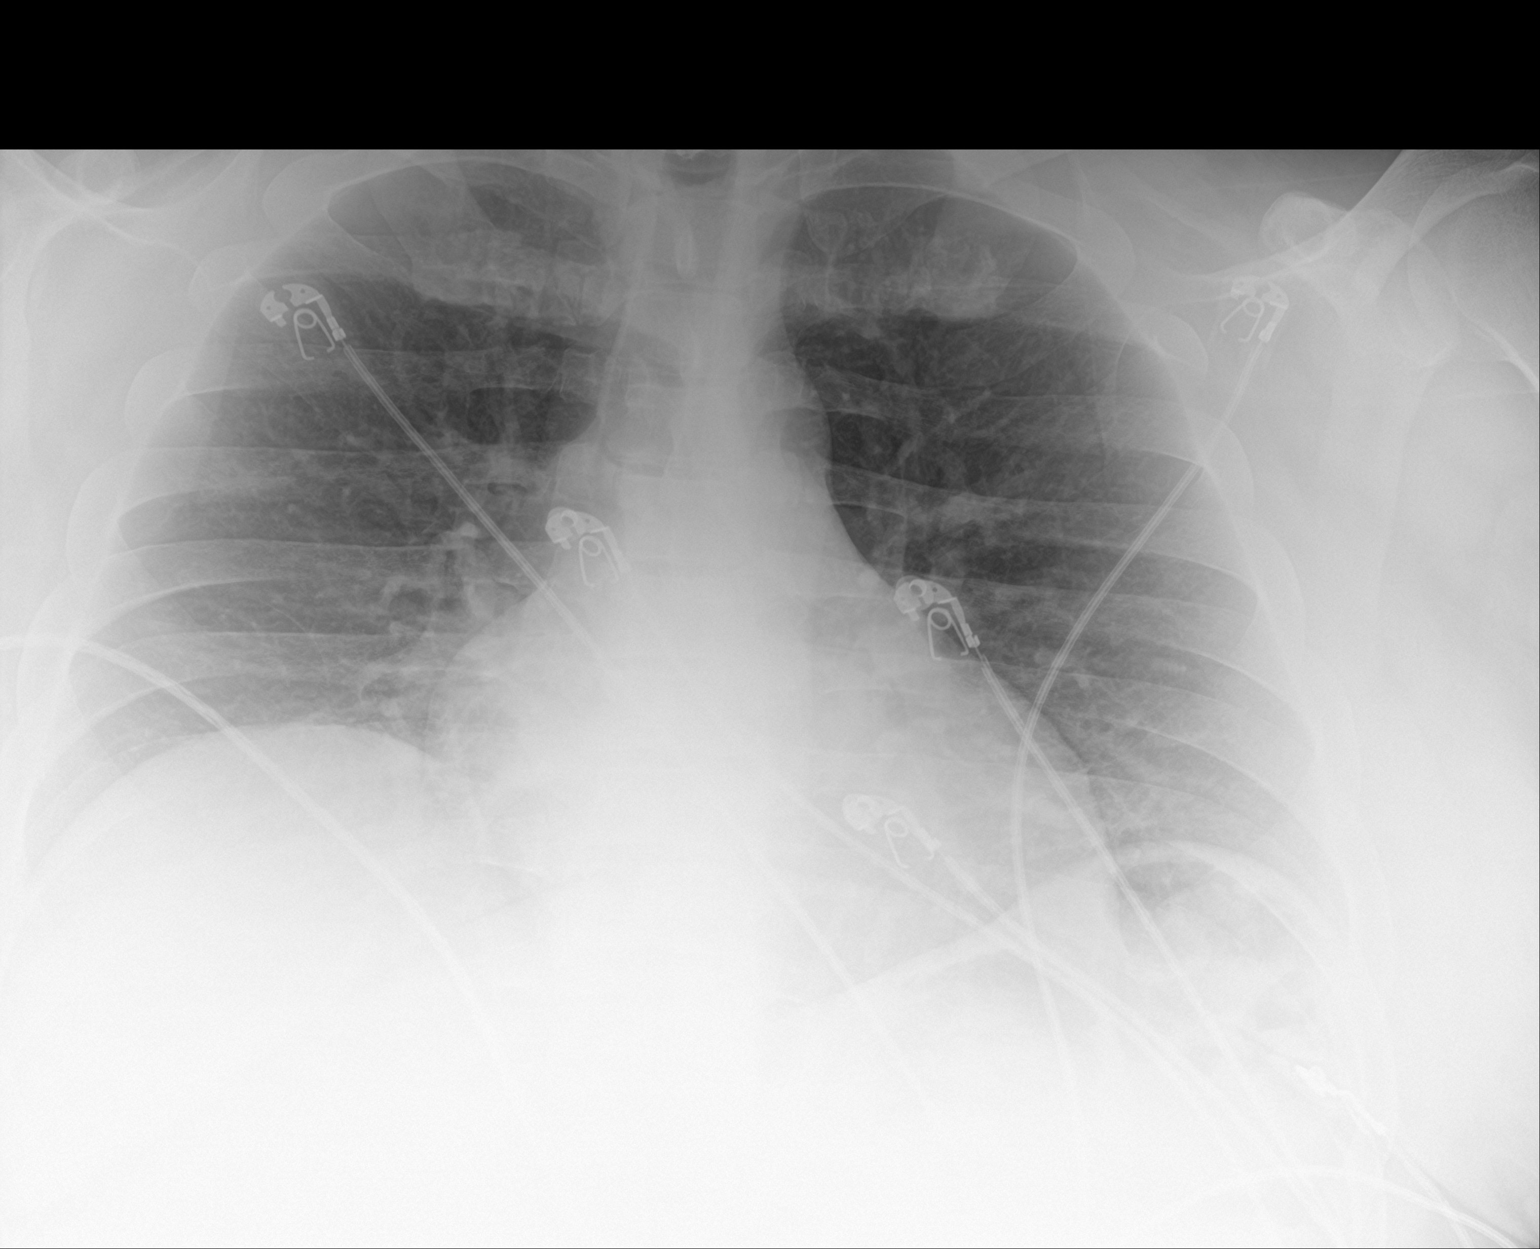

[2 of 2 positions shown; findings below may reference images not displayed]

FINDINGS: Cardiomediastinal silhouette is unremarkable for this low
inspiratory examination with crowded vasculature markings. The lungs
are clear without pleural effusions or focal consolidations. Trachea
projects midline and there is no pneumothorax. Included soft tissue
planes and osseous structures are non-suspicious.
IMPRESSION: No acute cardiopulmonary process for this low inspiratory
examination.

## 2019-11-10 IMAGING — CT CT ANGIO CHEST
2 of 7 series · 18 of 46 positions shown · IV contrast (APPLIED)
Comparison: Chest radiograph performed earlier today at [DATE] a.m.,
and CTA of the chest performed 01/06/2017

CLINICAL DATA: Acute onset of central chest pain.

EXAM:
CT ANGIOGRAPHY CHEST WITH CONTRAST
TECHNIQUE: Multidetector CT imaging of the chest was performed using the
standard protocol during bolus administration of intravenous
contrast. Multiplanar CT image reconstructions and MIPs were
obtained to evaluate the vascular anatomy.
CONTRAST:  91mL ATD49E-6AP IOPAMIDOL (ATD49E-6AP) INJECTION 76%

[Series 6: thins · axial · 0.84mm/px · z∈[-377,-75]mm · 15 of 484 slices shown]
[im 26/484  lung]
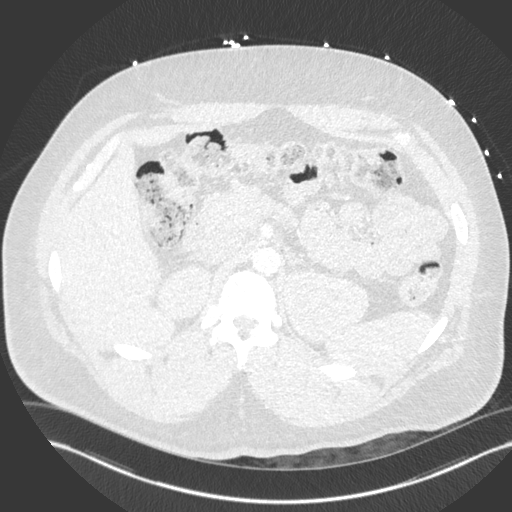
[im 51/484  soft-tissue]
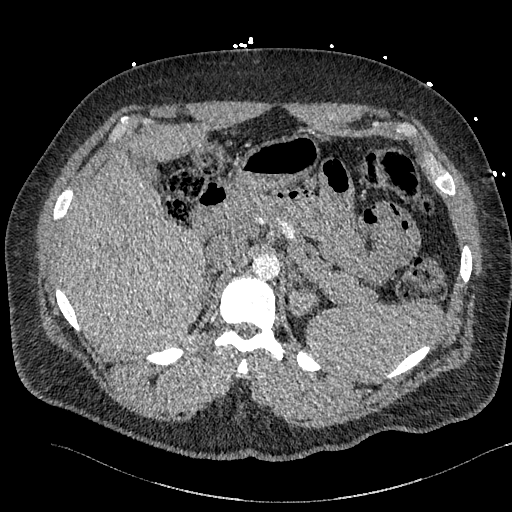
[im 102/484  lung]
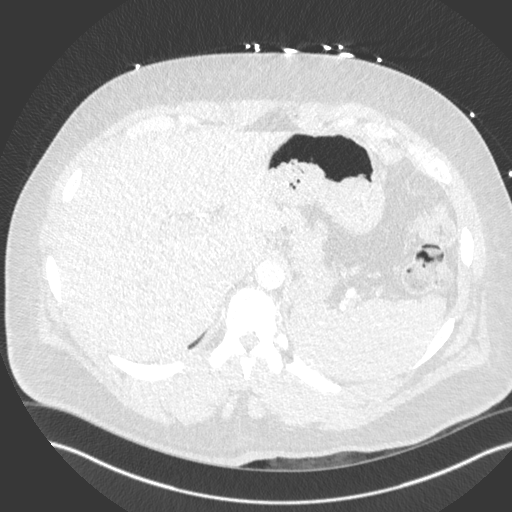
[im 128/484  soft-tissue]
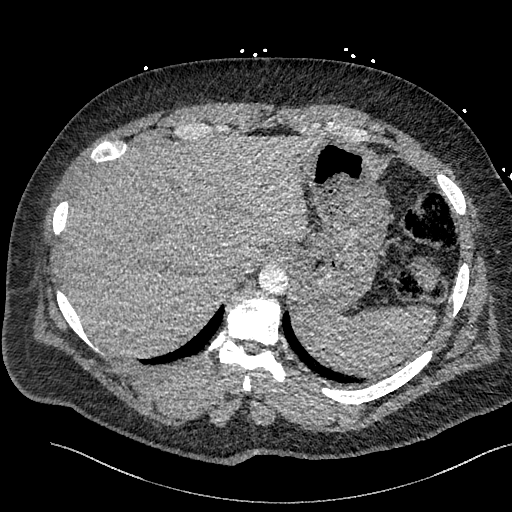
[im 153/484  lung]
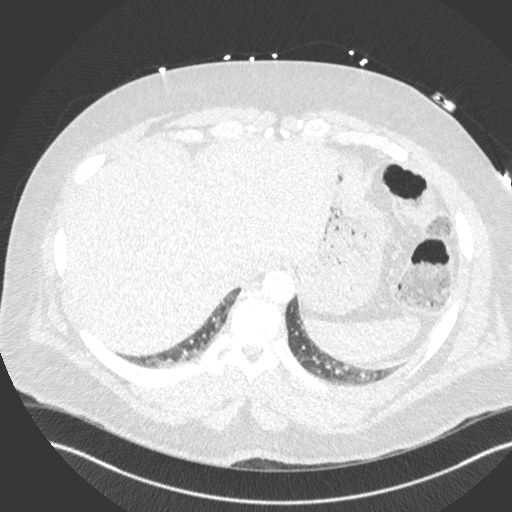
[im 178/484  soft-tissue]
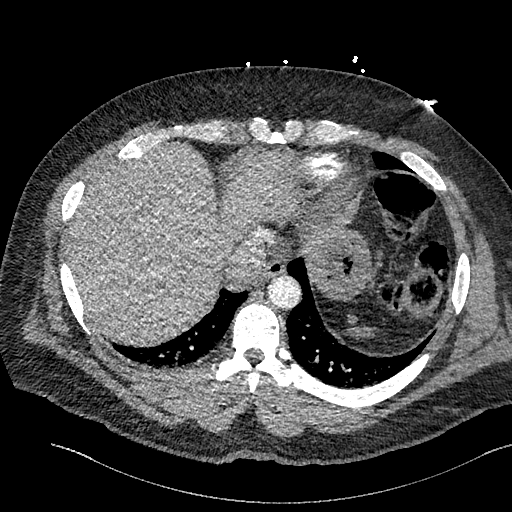
[im 204/484  lung]
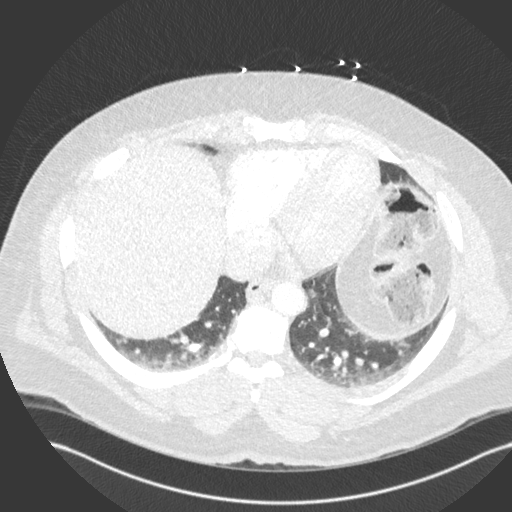
[im 255/484  soft-tissue]
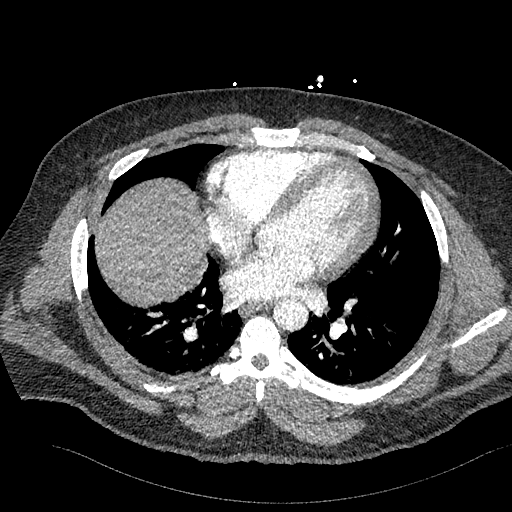
[im 280/484  lung]
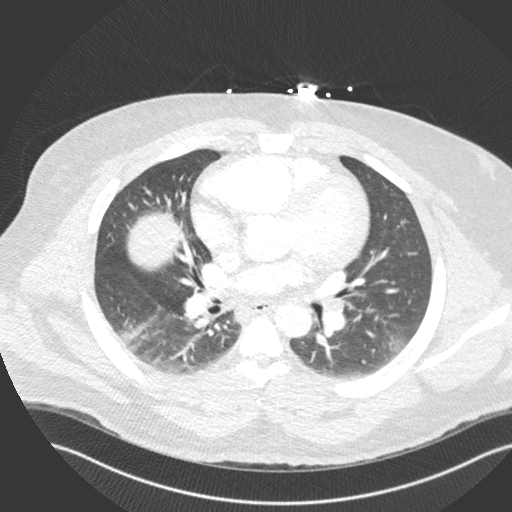
[im 306/484  soft-tissue]
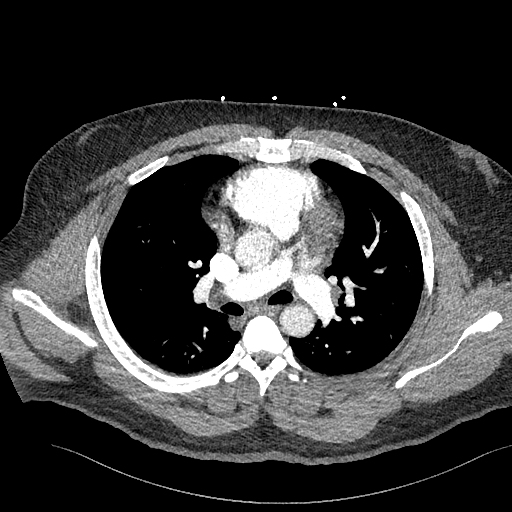
[im 331/484  lung]
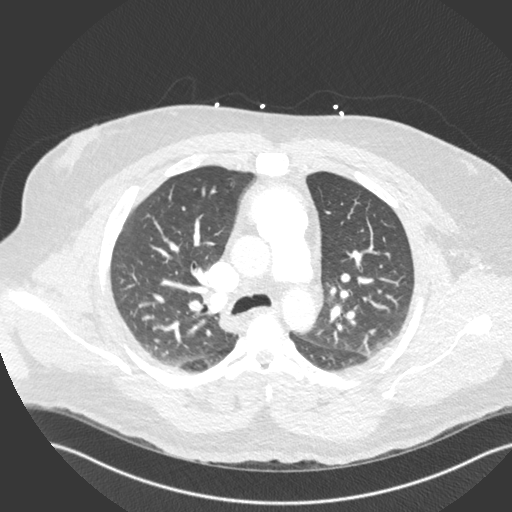
[im 356/484  soft-tissue]
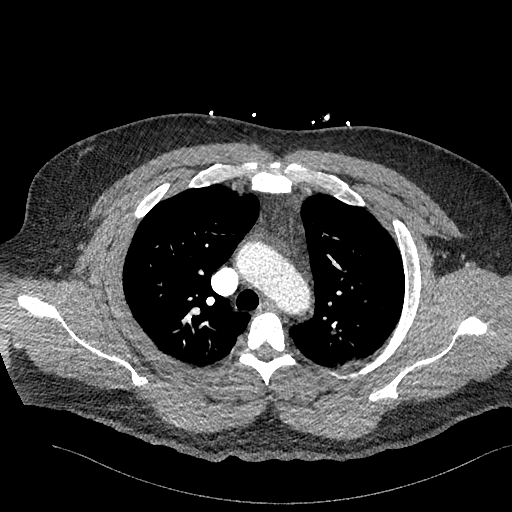
[im 407/484  lung]
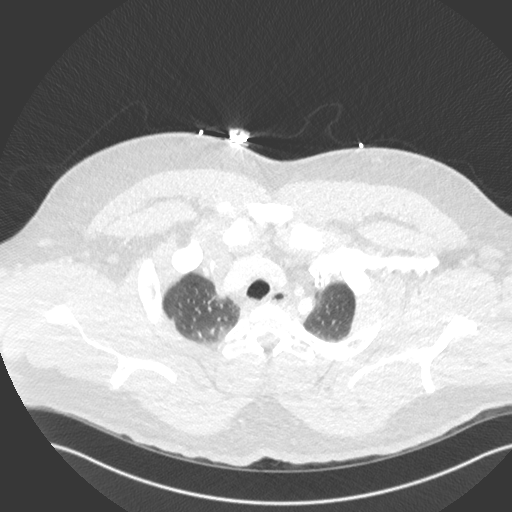
[im 433/484  soft-tissue]
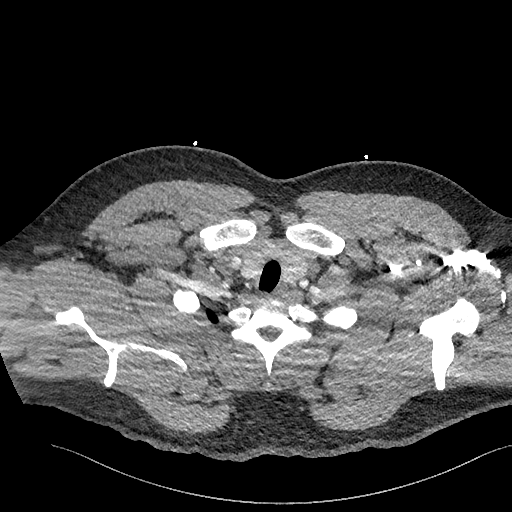
[im 458/484  lung]
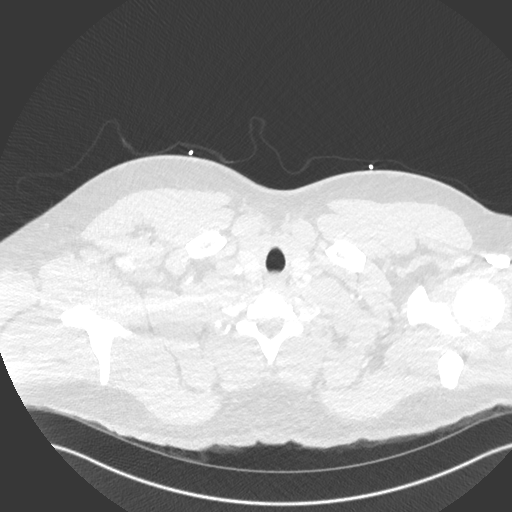

[Series 8: cor · coronal · 0.66mm/px · 3 of 136 slices shown]
[im 34/136  soft-tissue]
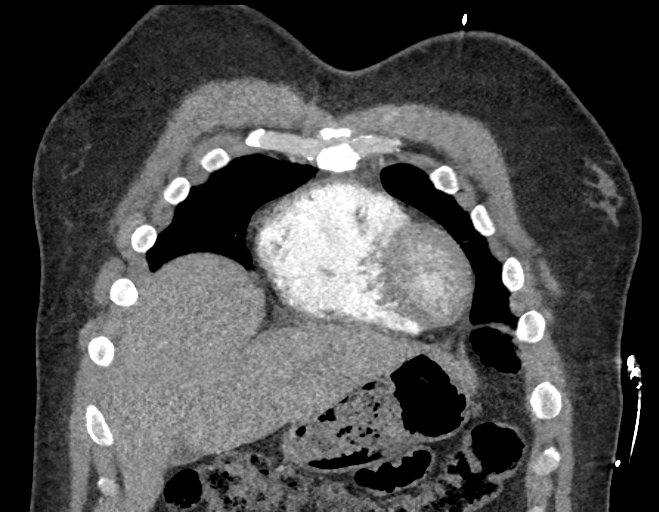
[im 68/136  soft-tissue]
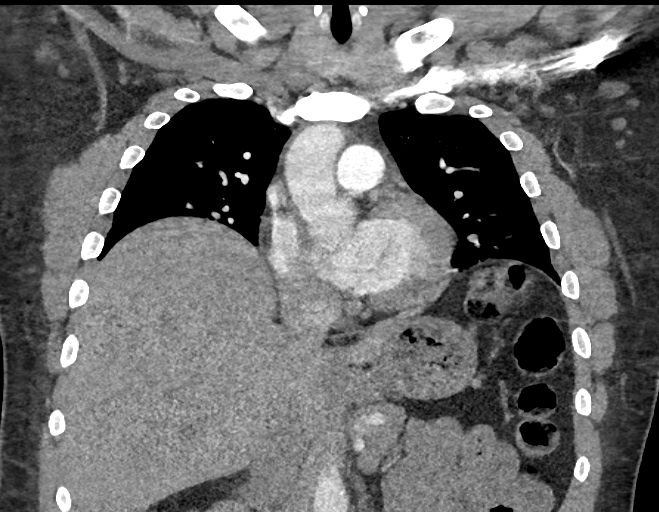
[im 102/136  soft-tissue]
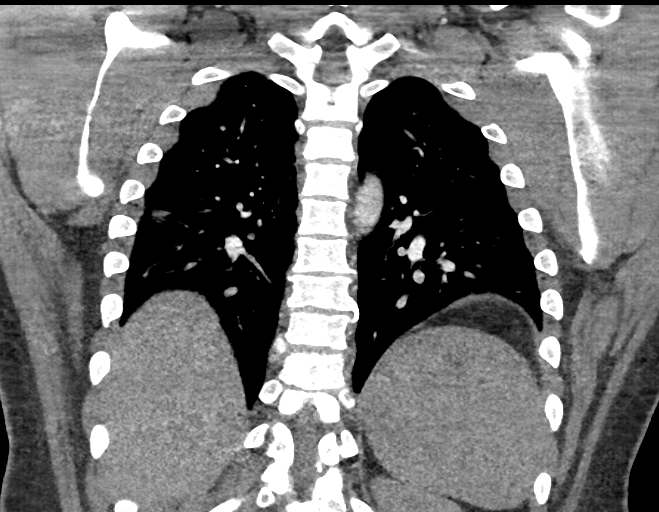

[18 of 46 positions shown; findings below may reference images not displayed]

FINDINGS: Cardiovascular:  There is no evidence of pulmonary embolus.

The heart is normal in size. The thoracic aorta is grossly
unremarkable. The great vessels are within normal limits.

Mediastinum/Nodes: The mediastinum is unremarkable in appearance. No
mediastinal lymphadenopathy is seen. No pericardial effusion is
identified. The thyroid gland is unremarkable. No axillary
lymphadenopathy is appreciated.

Lungs/Pleura: Mild bibasilar atelectasis is noted. No pleural
effusion or pneumothorax is seen. No masses are identified.

Upper Abdomen: The visualized portions of the liver and spleen are
unremarkable. The gallbladder is unremarkable. The visualized
portions of the pancreas, adrenal glands and kidneys are within
normal limits.

Musculoskeletal: No acute osseous abnormalities are identified. The
visualized musculature is unremarkable in appearance.

Review of the MIP images confirms the above findings.
IMPRESSION: 1. No evidence of pulmonary embolus.
2. Mild bibasilar atelectasis.  Lungs otherwise clear.
